# Patient Record
Sex: Female | Born: 1950
Health system: Southern US, Community
[De-identification: ages and names within clinical notes are randomized; demographics above are authoritative.]

## PROBLEM LIST (undated history)

## (undated) DIAGNOSIS — K219 Gastro-esophageal reflux disease without esophagitis: Secondary | ICD-10-CM

## (undated) DIAGNOSIS — M549 Dorsalgia, unspecified: Secondary | ICD-10-CM

## (undated) DIAGNOSIS — J45909 Unspecified asthma, uncomplicated: Secondary | ICD-10-CM

## (undated) DIAGNOSIS — N390 Urinary tract infection, site not specified: Secondary | ICD-10-CM

## (undated) DIAGNOSIS — M1990 Unspecified osteoarthritis, unspecified site: Secondary | ICD-10-CM

## (undated) DIAGNOSIS — Z8489 Family history of other specified conditions: Secondary | ICD-10-CM

## (undated) DIAGNOSIS — M25569 Pain in unspecified knee: Secondary | ICD-10-CM

## (undated) DIAGNOSIS — F419 Anxiety disorder, unspecified: Secondary | ICD-10-CM

## (undated) DIAGNOSIS — F32A Depression, unspecified: Secondary | ICD-10-CM

## (undated) DIAGNOSIS — U071 COVID-19: Secondary | ICD-10-CM

## (undated) DIAGNOSIS — R011 Cardiac murmur, unspecified: Secondary | ICD-10-CM

## (undated) HISTORY — PX: CARDIAC CATHETERIZATION: SHX172

## (undated) HISTORY — DX: Urinary tract infection, site not specified: N39.0

## (undated) HISTORY — DX: COVID-19: U07.1

## (undated) HISTORY — DX: Dorsalgia, unspecified: M54.9

## (undated) HISTORY — PX: TUBAL LIGATION: SHX77

## (undated) HISTORY — PX: COLONOSCOPY: SHX174

## (undated) HISTORY — DX: Unspecified osteoarthritis, unspecified site: M19.90

## (undated) HISTORY — DX: Cardiac murmur, unspecified: R01.1

## (undated) HISTORY — DX: Pain in unspecified knee: M25.569

## (undated) HISTORY — PX: CARPAL TUNNEL RELEASE: SHX101

## (undated) HISTORY — DX: Anxiety disorder, unspecified: F41.9

## (undated) HISTORY — DX: Gastro-esophageal reflux disease without esophagitis: K21.9

## (undated) HISTORY — PX: ROTATOR CUFF REPAIR: SHX139

---

## 1992-01-09 HISTORY — PX: ABDOMINAL HYSTERECTOMY: SHX81

## 1997-06-03 ENCOUNTER — Other Ambulatory Visit: Admission: RE | Admit: 1997-06-03 | Discharge: 1997-06-03 | Payer: Self-pay | Admitting: Gynecology

## 1998-08-25 ENCOUNTER — Other Ambulatory Visit: Admission: RE | Admit: 1998-08-25 | Discharge: 1998-08-25 | Payer: Self-pay | Admitting: Gynecology

## 1999-08-30 ENCOUNTER — Other Ambulatory Visit: Admission: RE | Admit: 1999-08-30 | Discharge: 1999-08-30 | Payer: Self-pay | Admitting: Gynecology

## 2001-11-13 ENCOUNTER — Other Ambulatory Visit: Admission: RE | Admit: 2001-11-13 | Discharge: 2001-11-13 | Payer: Self-pay | Admitting: Gynecology

## 2002-01-08 HISTORY — PX: ROTATOR CUFF REPAIR: SHX139

## 2002-04-24 ENCOUNTER — Observation Stay (HOSPITAL_COMMUNITY): Admission: RE | Admit: 2002-04-24 | Discharge: 2002-04-25 | Payer: Self-pay | Admitting: Orthopedic Surgery

## 2014-08-27 DIAGNOSIS — Z0000 Encounter for general adult medical examination without abnormal findings: Secondary | ICD-10-CM | POA: Diagnosis not present

## 2014-08-27 DIAGNOSIS — Z6831 Body mass index (BMI) 31.0-31.9, adult: Secondary | ICD-10-CM | POA: Diagnosis not present

## 2014-08-27 DIAGNOSIS — L03031 Cellulitis of right toe: Secondary | ICD-10-CM | POA: Diagnosis not present

## 2014-09-09 DIAGNOSIS — H6123 Impacted cerumen, bilateral: Secondary | ICD-10-CM | POA: Diagnosis not present

## 2014-09-09 DIAGNOSIS — H900 Conductive hearing loss, bilateral: Secondary | ICD-10-CM | POA: Diagnosis not present

## 2014-11-04 DIAGNOSIS — Z6830 Body mass index (BMI) 30.0-30.9, adult: Secondary | ICD-10-CM | POA: Diagnosis not present

## 2014-11-04 DIAGNOSIS — J208 Acute bronchitis due to other specified organisms: Secondary | ICD-10-CM | POA: Diagnosis not present

## 2015-03-22 DIAGNOSIS — Z23 Encounter for immunization: Secondary | ICD-10-CM | POA: Diagnosis not present

## 2015-08-26 DIAGNOSIS — Z79899 Other long term (current) drug therapy: Secondary | ICD-10-CM | POA: Diagnosis not present

## 2015-08-26 DIAGNOSIS — R5383 Other fatigue: Secondary | ICD-10-CM | POA: Diagnosis not present

## 2015-08-26 DIAGNOSIS — Z6831 Body mass index (BMI) 31.0-31.9, adult: Secondary | ICD-10-CM | POA: Diagnosis not present

## 2015-08-26 DIAGNOSIS — Z131 Encounter for screening for diabetes mellitus: Secondary | ICD-10-CM | POA: Diagnosis not present

## 2015-08-26 DIAGNOSIS — M170 Bilateral primary osteoarthritis of knee: Secondary | ICD-10-CM | POA: Diagnosis not present

## 2015-10-26 DIAGNOSIS — Z23 Encounter for immunization: Secondary | ICD-10-CM | POA: Diagnosis not present

## 2016-01-31 DIAGNOSIS — Z6831 Body mass index (BMI) 31.0-31.9, adult: Secondary | ICD-10-CM | POA: Diagnosis not present

## 2016-01-31 DIAGNOSIS — J0101 Acute recurrent maxillary sinusitis: Secondary | ICD-10-CM | POA: Diagnosis not present

## 2016-01-31 DIAGNOSIS — M170 Bilateral primary osteoarthritis of knee: Secondary | ICD-10-CM | POA: Diagnosis not present

## 2016-05-03 DIAGNOSIS — M170 Bilateral primary osteoarthritis of knee: Secondary | ICD-10-CM | POA: Diagnosis not present

## 2016-05-03 DIAGNOSIS — Z0000 Encounter for general adult medical examination without abnormal findings: Secondary | ICD-10-CM | POA: Diagnosis not present

## 2016-05-03 DIAGNOSIS — M545 Low back pain: Secondary | ICD-10-CM | POA: Diagnosis not present

## 2016-05-03 DIAGNOSIS — Z6830 Body mass index (BMI) 30.0-30.9, adult: Secondary | ICD-10-CM | POA: Diagnosis not present

## 2016-05-31 DIAGNOSIS — Z1231 Encounter for screening mammogram for malignant neoplasm of breast: Secondary | ICD-10-CM | POA: Diagnosis not present

## 2016-06-20 DIAGNOSIS — N6312 Unspecified lump in the right breast, upper inner quadrant: Secondary | ICD-10-CM | POA: Diagnosis not present

## 2016-06-20 DIAGNOSIS — N6310 Unspecified lump in the right breast, unspecified quadrant: Secondary | ICD-10-CM | POA: Diagnosis not present

## 2016-06-20 DIAGNOSIS — R928 Other abnormal and inconclusive findings on diagnostic imaging of breast: Secondary | ICD-10-CM | POA: Diagnosis not present

## 2016-08-06 DIAGNOSIS — L237 Allergic contact dermatitis due to plants, except food: Secondary | ICD-10-CM | POA: Diagnosis not present

## 2016-08-06 DIAGNOSIS — F411 Generalized anxiety disorder: Secondary | ICD-10-CM | POA: Diagnosis not present

## 2016-10-24 DIAGNOSIS — Z23 Encounter for immunization: Secondary | ICD-10-CM | POA: Diagnosis not present

## 2016-11-06 DIAGNOSIS — M5489 Other dorsalgia: Secondary | ICD-10-CM | POA: Diagnosis not present

## 2016-11-09 ENCOUNTER — Other Ambulatory Visit: Payer: Self-pay | Admitting: Orthopedic Surgery

## 2016-11-09 DIAGNOSIS — M549 Dorsalgia, unspecified: Secondary | ICD-10-CM

## 2016-11-13 DIAGNOSIS — Z823 Family history of stroke: Secondary | ICD-10-CM | POA: Diagnosis not present

## 2016-11-13 DIAGNOSIS — M19012 Primary osteoarthritis, left shoulder: Secondary | ICD-10-CM | POA: Diagnosis not present

## 2016-11-13 DIAGNOSIS — M19011 Primary osteoarthritis, right shoulder: Secondary | ICD-10-CM | POA: Diagnosis not present

## 2016-11-13 DIAGNOSIS — R1084 Generalized abdominal pain: Secondary | ICD-10-CM | POA: Diagnosis not present

## 2016-11-13 DIAGNOSIS — M545 Low back pain: Secondary | ICD-10-CM | POA: Diagnosis not present

## 2016-11-13 DIAGNOSIS — M170 Bilateral primary osteoarthritis of knee: Secondary | ICD-10-CM | POA: Diagnosis not present

## 2016-11-13 DIAGNOSIS — R109 Unspecified abdominal pain: Secondary | ICD-10-CM | POA: Diagnosis not present

## 2016-11-13 DIAGNOSIS — N281 Cyst of kidney, acquired: Secondary | ICD-10-CM | POA: Diagnosis not present

## 2016-11-13 DIAGNOSIS — R110 Nausea: Secondary | ICD-10-CM | POA: Diagnosis not present

## 2016-11-13 DIAGNOSIS — Z801 Family history of malignant neoplasm of trachea, bronchus and lung: Secondary | ICD-10-CM | POA: Diagnosis not present

## 2016-11-13 DIAGNOSIS — R1030 Lower abdominal pain, unspecified: Secondary | ICD-10-CM | POA: Diagnosis not present

## 2016-11-13 DIAGNOSIS — Z79899 Other long term (current) drug therapy: Secondary | ICD-10-CM | POA: Diagnosis not present

## 2016-11-13 DIAGNOSIS — G8929 Other chronic pain: Secondary | ICD-10-CM | POA: Diagnosis not present

## 2016-11-13 DIAGNOSIS — N170 Acute kidney failure with tubular necrosis: Secondary | ICD-10-CM | POA: Diagnosis not present

## 2016-11-13 DIAGNOSIS — Z803 Family history of malignant neoplasm of breast: Secondary | ICD-10-CM | POA: Diagnosis not present

## 2016-11-13 DIAGNOSIS — Z90710 Acquired absence of both cervix and uterus: Secondary | ICD-10-CM | POA: Diagnosis not present

## 2016-11-13 DIAGNOSIS — N390 Urinary tract infection, site not specified: Secondary | ICD-10-CM | POA: Diagnosis not present

## 2016-11-13 DIAGNOSIS — Z8249 Family history of ischemic heart disease and other diseases of the circulatory system: Secondary | ICD-10-CM | POA: Diagnosis not present

## 2016-11-13 DIAGNOSIS — R112 Nausea with vomiting, unspecified: Secondary | ICD-10-CM | POA: Diagnosis not present

## 2016-11-13 DIAGNOSIS — R509 Fever, unspecified: Secondary | ICD-10-CM | POA: Diagnosis not present

## 2016-11-14 ENCOUNTER — Other Ambulatory Visit: Payer: Self-pay

## 2016-11-14 DIAGNOSIS — R1084 Generalized abdominal pain: Secondary | ICD-10-CM | POA: Diagnosis not present

## 2016-11-14 DIAGNOSIS — M19012 Primary osteoarthritis, left shoulder: Secondary | ICD-10-CM | POA: Diagnosis not present

## 2016-11-14 DIAGNOSIS — R110 Nausea: Secondary | ICD-10-CM | POA: Diagnosis not present

## 2016-11-14 DIAGNOSIS — R1030 Lower abdominal pain, unspecified: Secondary | ICD-10-CM | POA: Diagnosis not present

## 2016-11-14 DIAGNOSIS — N390 Urinary tract infection, site not specified: Secondary | ICD-10-CM | POA: Diagnosis not present

## 2016-11-14 DIAGNOSIS — M19011 Primary osteoarthritis, right shoulder: Secondary | ICD-10-CM | POA: Diagnosis not present

## 2016-11-14 DIAGNOSIS — N170 Acute kidney failure with tubular necrosis: Secondary | ICD-10-CM | POA: Diagnosis not present

## 2016-11-14 DIAGNOSIS — M170 Bilateral primary osteoarthritis of knee: Secondary | ICD-10-CM | POA: Diagnosis not present

## 2016-11-14 DIAGNOSIS — R112 Nausea with vomiting, unspecified: Secondary | ICD-10-CM | POA: Diagnosis not present

## 2016-11-27 DIAGNOSIS — F411 Generalized anxiety disorder: Secondary | ICD-10-CM | POA: Diagnosis not present

## 2016-11-27 DIAGNOSIS — M545 Low back pain: Secondary | ICD-10-CM | POA: Diagnosis not present

## 2016-11-27 DIAGNOSIS — N3001 Acute cystitis with hematuria: Secondary | ICD-10-CM | POA: Diagnosis not present

## 2016-12-20 ENCOUNTER — Ambulatory Visit: Payer: Self-pay | Admitting: Gynecology

## 2017-01-29 DIAGNOSIS — M810 Age-related osteoporosis without current pathological fracture: Secondary | ICD-10-CM | POA: Diagnosis not present

## 2017-01-29 DIAGNOSIS — E2839 Other primary ovarian failure: Secondary | ICD-10-CM | POA: Diagnosis not present

## 2017-02-01 ENCOUNTER — Ambulatory Visit (INDEPENDENT_AMBULATORY_CARE_PROVIDER_SITE_OTHER): Payer: Medicare Other | Admitting: Gynecology

## 2017-02-01 ENCOUNTER — Encounter: Payer: Self-pay | Admitting: Gynecology

## 2017-02-01 ENCOUNTER — Telehealth: Payer: Self-pay | Admitting: *Deleted

## 2017-02-01 VITALS — BP 122/76 | Ht 63.5 in | Wt 184.0 lb

## 2017-02-01 DIAGNOSIS — R350 Frequency of micturition: Secondary | ICD-10-CM | POA: Diagnosis not present

## 2017-02-01 DIAGNOSIS — Z01411 Encounter for gynecological examination (general) (routine) with abnormal findings: Secondary | ICD-10-CM

## 2017-02-01 DIAGNOSIS — R1030 Lower abdominal pain, unspecified: Secondary | ICD-10-CM

## 2017-02-01 DIAGNOSIS — N952 Postmenopausal atrophic vaginitis: Secondary | ICD-10-CM

## 2017-02-01 NOTE — Patient Instructions (Signed)
Office will call you to arrange a gastroenterology appointment.  If you do not hear from their office within the next 1-2 weeks call our office.

## 2017-02-01 NOTE — Telephone Encounter (Signed)
Referral placed at [facility] they will call patient to schedule.

## 2017-02-01 NOTE — Progress Notes (Signed)
    Grace Thomas [birth year] [id number]        67 y.o.  G2P2 new patient who has not had a gynecologic exam in a number of years.  For breast and pelvic exam.  History of TAH/BSO by Dr. Ubaldo Glassing for pain in 1994.  Was hospitalized in November due to abdominal pain and was felt to have a gastritis.  Reportedly negative CT scan.  Since then she has had lower abdominal pressure and discomfort.  No diarrhea or constipation.  No urinary symptoms such as frequency dysuria urgency low back pain fever or chills.  Past medical history,surgical history, problem list, medications, allergies, family history and social history were all reviewed and documented as reviewed in the EPIC chart.  ROS:  Performed with pertinent positives and negatives included in the history, assessment and plan.   Additional significant findings : None   Exam: Caryn Bee assistant Vitals:   02/01/17 1155  BP: 122/76  Weight: 184 lb (83.5 kg)  Height: 5' 3.5" (1.613 m)   Body mass index is 32.08 kg/m.  General appearance:  Normal affect, orientation and appearance. Skin: Grossly normal HEENT: Without gross lesions.  No cervical or supraclavicular adenopathy. Thyroid normal.  Lungs:  Clear without wheezing, rales or rhonchi Cardiac: RR, without RMG Abdominal:  Soft, nontender, without masses, guarding, rebound, organomegaly or hernia Breasts:  Examined lying and sitting without masses, retractions, discharge or axillary adenopathy. Pelvic:  Ext, BUS, Vagina: With atrophic changes  Adnexa: Without masses or tenderness    Anus and perineum: Normal   Rectovaginal: Normal sphincter tone without palpated masses or tenderness.    Assessment/Plan:  67 y.o. G2P2 female for breast and pelvic exam.   1. Lower abdominal discomfort.  Exam is negative.  Suspect GI in etiology given history that it started with a gastritis type onset.  Also status post TAH/BSO.  Reportedly negative CT scan.  Will obtain copy from hospital in [place]  where she had a done.  Recommend follow-up with GI now.  Possible low-grade colitis or diverticulitis.  We will help her make this appointment.  Urine analysis is negative today. 2. Mammography 06/2016.  Continue with annual mammography when due.  Breast exam normal today. 3. DEXA reported recently.  She will follow-up with her primary physician who is following her for this.  I have no copies of these reports in epic. 4. Pap smear reported 5 years ago.  No Pap smear done today.  Reviewed current screening guidelines.  She has no history of abnormal Pap smears.  Over the age of 67 and status post hysterectomy for benign indications.  We both agree to stop screening. 5. Colonoscopy many years ago.  Will address when she sees the gastroenterologist and I suspect that they will proceed with this at that time. 6. Health maintenance.  No routine lab work done as patient does this elsewhere.  Follow-up in 1 year for gynecologic exam.  Follow-up with the gastroenterologist in reference to her lower abdominal pain.  Additional time in excess of her breast and pelvic exam was spent in direct face to face counseling and coordination of care in regards to her lower abdominal pain.    Anastasio Auerbach MD, 12:28 PM 02/01/2017

## 2017-02-01 NOTE — Telephone Encounter (Signed)
-----   Message from Anastasio Auerbach, MD sent at 02/01/2017 12:25 PM EST ----- Arrange for GI appointment with [facility] reference lower abdominal patient status post hysterectomy BSO in the past.  Reportedly negative CT scan.  Seems to follow bout of gastroenteritis in November now with chronic pain

## 2017-02-02 LAB — URINALYSIS, COMPLETE W/RFL CULTURE
BACTERIA UA: NONE SEEN /HPF
BILIRUBIN URINE: NEGATIVE
Glucose, UA: NEGATIVE
Hgb urine dipstick: NEGATIVE
Hyaline Cast: NONE SEEN /LPF
Ketones, ur: NEGATIVE
LEUKOCYTE ESTERASE: NEGATIVE
Nitrites, Initial: NEGATIVE
PROTEIN: NEGATIVE
RBC / HPF: NONE SEEN /HPF (ref 0–2)
Specific Gravity, Urine: 1.015 (ref 1.001–1.03)
WBC, UA: NONE SEEN /HPF (ref 0–5)
pH: 5.5 (ref 5.0–8.0)

## 2017-02-02 LAB — NO CULTURE INDICATED

## 2017-02-06 NOTE — Telephone Encounter (Signed)
[facility] has left message for pt to call x 2

## 2017-02-08 ENCOUNTER — Encounter: Payer: Self-pay | Admitting: Gastroenterology

## 2017-02-12 NOTE — Telephone Encounter (Signed)
Pt scheduled on 03/20/17 with Dr.Danis

## 2017-03-18 ENCOUNTER — Telehealth: Payer: Self-pay | Admitting: *Deleted

## 2017-03-18 ENCOUNTER — Other Ambulatory Visit: Payer: Self-pay

## 2017-03-18 NOTE — Telephone Encounter (Signed)
Pt called and left message in voicemail, to call her. I called and left message for pt to call.

## 2017-03-18 NOTE — Telephone Encounter (Signed)
Receiving request for Diflucan from pharmacy.  No communication from the patient as to why?

## 2017-03-18 NOTE — Telephone Encounter (Signed)
Left detailed message in voice mail and asked her to call me back and let me know what sx she is having that makes her think she needs refill on this Rx.

## 2017-03-19 ENCOUNTER — Telehealth: Payer: Self-pay

## 2017-03-19 NOTE — Telephone Encounter (Signed)
Patient has not called back. I denied refill with a note that patient needs to contact provider and put a note on that that I left message for her to call to discuss sx.

## 2017-03-19 NOTE — Telephone Encounter (Signed)
Grace Thomas spoke with patient.

## 2017-03-19 NOTE — Telephone Encounter (Signed)
Left detailed message in her voice mail per [facility] access note on file.

## 2017-03-19 NOTE — Telephone Encounter (Signed)
It does not sound like it worked in a more sure putting more medication towards that is the right solution.  I recommend office visit for evaluation.

## 2017-03-19 NOTE — Progress Notes (Signed)
[facility] Consult Note:  History: Grace Thomas 03/20/2017  Referring physician: Donalynn Furlong, MD  Reason for consult/chief complaint: Abdominal Pain (lower abdominal pain; patient seems to associate this more with uti and gyn issues ); Diarrhea (at times when she is nervous; no blood noted in stool; does not recall ever having a colonoscopy); and Gas (with belching and bloating)   Subjective  HPI:  This is a 67 year old woman referred by Dr. Phineas Real of gynecology for lower abdominal pain.  She is a limited historian, and we have no primary care records.  She reports at least several months of intermittent crampy suprapubic abdominal pain as well as occasional loose nonbloody stool.  It is not clear that those 2 things occur at the same time.  Sometimes the diarrhea seems to happen if she feels anxious.  There is no clear relation to time of day or eating.  She is also bothered by vaginal yeast infection with persistent burning and itching and will soon be following up with Dr. Phineas Real regarding this.  She was apparently hospitalized in [place] earlier this year with abdominal pain and diarrhea, she believes a scan of the abdomen was done, we do not have the records or report.  She recalls being told that they felt she had a viral infection. No prior history with [facility].  She does not think she is ever had a colonoscopy.  ROS:  Review of Systems  Constitutional: Negative for appetite change and unexpected weight change.  HENT: Negative for mouth sores and voice change.   Eyes: Negative for pain and redness.  Respiratory: Negative for cough and shortness of breath.   Cardiovascular: Negative for chest pain and palpitations.  Genitourinary: Negative for dysuria and hematuria.  Musculoskeletal: Positive for arthralgias. Negative for myalgias.  Skin: Negative for pallor and rash.  Neurological: Negative for weakness and headaches.  Hematological: Negative  for adenopathy.     Past Medical History: Past Medical History:  Diagnosis Date  . Anxiety   . Arthritis   . Recurrent UTI      Past Surgical History: Past Surgical History:  Procedure Laterality Date  . ABDOMINAL HYSTERECTOMY  1994   TAH BSO  . CARPAL TUNNEL RELEASE Bilateral   . ROTATOR CUFF REPAIR Right   . TUBAL LIGATION       Family History: Family History  Problem Relation Age of Onset  . Heart failure Mother   . Stroke Father   . Bone cancer Sister   . Pancreatic cancer Sister   . Colitis Daughter     Social History: Social History   Socioeconomic History  . Marital status: Married    Spouse name: None  . Number of children: None  . Years of education: None  . Highest education level: None  Social Needs  . Financial resource strain: None  . Food insecurity - worry: None  . Food insecurity - inability: None  . Transportation needs - medical: None  . Transportation needs - non-medical: None  Occupational History  . None  Tobacco Use  . Smoking status: Never Smoker  . Smokeless tobacco: Never Used  Substance and Sexual Activity  . Alcohol use: No    Frequency: Never  . Drug use: No  . Sexual activity: Not Currently    Comment: 1st intercourse 14 yo-1 partner  Other Topics Concern  . None  Social History Narrative  . None    Allergies: No Known Allergies  Outpatient Meds: Current Outpatient  Medications  Medication Sig Dispense Refill  . ALPRAZolam (XANAX) 0.25 MG tablet Take 0.25 mg by mouth at bedtime as needed for anxiety.    . celecoxib (CELEBREX) 100 MG capsule Take 100 mg by mouth 2 (two) times daily as needed.     . Cholecalciferol (VITAMIN D PO) Take 2,000 Units by mouth daily.     . traMADol (ULTRAM) 50 MG tablet Take by mouth every 6 (six) hours as needed.    Marland Kitchen PEG-KCl-NaCl-NaSulf-Na Asc-C (PLENVU) 140 g SOLR Take 140 g by mouth as directed. 1 each 0   No current facility-administered medications for this visit.        ___________________________________________________________________ Objective   Exam:  BP 112/72   Pulse 72   Ht 5' 3.5" (1.613 m)   Wt 185 lb 9.6 oz (84.2 kg)   BMI 32.36 kg/m    General: this is a(n) well-appearing woman  Eyes: sclera anicteric, no redness  ENT: oral mucosa moist without lesions, no cervical or supraclavicular lymphadenopathy, good dentition  CV: RRR without murmur, S1/S2, no JVD, no peripheral edema  Resp: clear to auscultation bilaterally, normal RR and effort noted  GI: soft, no tenderness, with active bowel sounds. No guarding or palpable organomegaly noted.  Skin; warm and dry, no rash or jaundice noted  Neuro: awake, alert and oriented x 3. Normal gross motor function and fluent speech  Labs:  No data for review  Assessment: Encounter Diagnoses  Name Primary?  . Lower abdominal pain Yes  . Diarrhea, unspecified type     The pain is somewhat difficult to characterize.  It is also unclear if this intermittent diarrhea is related to it.  There does seem to be an ongoing gynecologic issue as well.  Plan:  Colonoscopy.  She is agreeable after discussion of procedure and risks.  The benefits and risks of the planned procedure were described in detail with the patient or (when appropriate) their health care proxy.  Risks were outlined as including, but not limited to, bleeding, infection, perforation, adverse medication reaction leading to cardiac or pulmonary decompensation, or pancreatitis (if ERCP).  The limitation of incomplete mucosal visualization was also discussed.  No guarantees or warranties were given.   Thank you for the courtesy of this consult.  Please call me with any questions or concerns.  Nelida Meuse III  CC: Donalynn Furlong, MD

## 2017-03-19 NOTE — Telephone Encounter (Signed)
Patient had requested Diflucan tablet. I called her to understand why.  She said since December she has been dealing with vaginal yeast infection.  She said her PCP prescribed Diflucan when she left the hospital.   Since then she has used Monistat 3 a time or two. She has "terrible vaginal itching"  And a little discharge and no odor. She said she asked you to refill because she had seen you recently. She said to ask if you would please refill it and if it persists she will come see you.

## 2017-03-20 ENCOUNTER — Encounter: Payer: Self-pay | Admitting: Gastroenterology

## 2017-03-20 ENCOUNTER — Ambulatory Visit (INDEPENDENT_AMBULATORY_CARE_PROVIDER_SITE_OTHER): Payer: Medicare Other | Admitting: Gastroenterology

## 2017-03-20 VITALS — BP 112/72 | HR 72 | Ht 63.5 in | Wt 185.6 lb

## 2017-03-20 DIAGNOSIS — R1030 Lower abdominal pain, unspecified: Secondary | ICD-10-CM

## 2017-03-20 DIAGNOSIS — R197 Diarrhea, unspecified: Secondary | ICD-10-CM

## 2017-03-20 MED ORDER — PEG-KCL-NACL-NASULF-NA ASC-C 140 G PO SOLR: 140.0000 g | ORAL | 0 refills | Status: DC

## 2017-03-20 NOTE — Patient Instructions (Signed)
If you are age 68 or older, your body mass index should be between 23-30. Your Body mass index is 32.36 kg/m. If this is out of the aforementioned range listed, please consider follow up with your Primary Care Provider.  If you are age 56 or younger, your body mass index should be between 19-25. Your Body mass index is 32.36 kg/m. If this is out of the aformentioned range listed, please consider follow up with your Primary Care Provider.   You have been scheduled for a colonoscopy. Please follow written instructions given to you at your visit today.  Please pick up your prep supplies at the pharmacy within the next 1-3 days. If you use inhalers (even only as needed), please bring them with you on the day of your procedure. Your physician has requested that you go to www.startemmi.com and enter the access code given to you at your visit today. This web site gives a general overview about your procedure. However, you should still follow specific instructions given to you by our office regarding your preparation for the procedure.  Thank you for choosing [facility].  Wilfrid Lund, MD

## 2017-03-21 ENCOUNTER — Ambulatory Visit (INDEPENDENT_AMBULATORY_CARE_PROVIDER_SITE_OTHER): Payer: Medicare Other

## 2017-03-21 ENCOUNTER — Encounter: Payer: Self-pay | Admitting: Podiatry

## 2017-03-21 ENCOUNTER — Ambulatory Visit (INDEPENDENT_AMBULATORY_CARE_PROVIDER_SITE_OTHER): Payer: Medicare Other | Admitting: Gynecology

## 2017-03-21 ENCOUNTER — Ambulatory Visit (INDEPENDENT_AMBULATORY_CARE_PROVIDER_SITE_OTHER): Payer: Medicare Other | Admitting: Podiatry

## 2017-03-21 ENCOUNTER — Encounter: Payer: Self-pay | Admitting: Gynecology

## 2017-03-21 VITALS — BP 118/59 | HR 79 | Resp 16 | Ht 63.5 in | Wt 184.0 lb

## 2017-03-21 VITALS — BP 124/80

## 2017-03-21 DIAGNOSIS — M2042 Other hammer toe(s) (acquired), left foot: Secondary | ICD-10-CM

## 2017-03-21 DIAGNOSIS — B351 Tinea unguium: Secondary | ICD-10-CM | POA: Diagnosis not present

## 2017-03-21 DIAGNOSIS — M79674 Pain in right toe(s): Secondary | ICD-10-CM

## 2017-03-21 DIAGNOSIS — R102 Pelvic and perineal pain unspecified side: Secondary | ICD-10-CM

## 2017-03-21 DIAGNOSIS — M79675 Pain in left toe(s): Secondary | ICD-10-CM | POA: Diagnosis not present

## 2017-03-21 DIAGNOSIS — L84 Corns and callosities: Secondary | ICD-10-CM

## 2017-03-21 DIAGNOSIS — N898 Other specified noninflammatory disorders of vagina: Secondary | ICD-10-CM | POA: Diagnosis not present

## 2017-03-21 DIAGNOSIS — M2041 Other hammer toe(s) (acquired), right foot: Secondary | ICD-10-CM

## 2017-03-21 LAB — WET PREP FOR TRICH, YEAST, CLUE

## 2017-03-21 MED ORDER — TERCONAZOLE 0.4 % VA CREA: 1.0000 | TOPICAL_CREAM | Freq: Every day | VAGINAL | 0 refills | Status: DC

## 2017-03-21 NOTE — Progress Notes (Signed)
Subjective:   Patient ID: Grace Thomas, female   DOB: 67 y.o.   MRN: [id number]   HPI Patient presents with nail disease with pain 1-5 both feet and digital deformities bilateral along with lesion second digit left over right foot that is painful second toe.  Patient does not smoke and likes to be active   Review of Systems  All other systems reviewed and are negative.       Objective:  Physical Exam  Constitutional: She appears well-developed and well-nourished.  Cardiovascular: Intact distal pulses.  Pulmonary/Chest: Effort normal.  Musculoskeletal: Normal range of motion.  Neurological: She is alert.  Skin: Skin is warm.  Nursing note and vitals reviewed.   Neurovascular status was found to be intact muscle strength was found to be adequate with range of motion within normal limits.  Patient does have digital deformities with hammertoe deformities lesser digits bilateral with compression of the hallux against the second toe bilateral with keratotic lesion second digit left over right that is painful when pressed.  Patient was noted to have good digital perfusion and is well oriented x3     Assessment:  Mycotic nail infection with pain 1-5 both feet along with hammertoe deformity and keratotic lesion formation     Plan:  H&P conditions reviewed and debridement of nailbeds and taken out the corners carefully 1-5 both feet accomplished with no iatrogenic bleeding and I did debride lesion applied padding and if this were to become worse at one point digital surgery may be necessary  X-ray indicates that there is digital deformities and there is pressure between the hallux and second toe of both feet

## 2017-03-21 NOTE — Progress Notes (Signed)
    Grace Thomas [birth year] [id number]        67 y.o.  G2P2 is complaining of vaginal itching starting approximately 1 week ago.  No discharge or odor.  No UTI symptoms such as frequency dysuria urgency low back pain fever or chills.  She does have suprapubic lower abdominal cramping bloating type discomfort that she is being evaluated by GI.  She has a colonoscopy scheduled next week.  She is status post TAH/BSO in the past.  Used OTC product for the yeast and notes that her symptoms seem to be gone now.  Past medical history,surgical history, problem list, medications, allergies, family history and social history were all reviewed and documented in the EPIC chart.  Directed ROS with pertinent positives and negatives documented in the history of present illness/assessment and plan.  Exam: Caryn Bee assistant Vitals:   03/21/17 1128  BP: 124/80   General appearance:  Normal Abdomen soft nontender without masses guarding rebound Pelvic external BUS vagina with atrophic changes.  Scant white discharge noted.  Bimanual without masses or tenderness.  Assessment/Plan:  67 y.o. G2P2 with history of vaginal itching used OTC product and now notes symptoms seem to be resolved.  She does note more frequent yeast infections over the last year or 2.  Exam without evidence of active infection with wet prep negative.  I did recommend Terazol 7 day cream to have available so that if she has a recurrence of symptoms to go ahead and use this instead of the OTC product to see if this may cover more resistant species of yeast to prevent recurrences.  Otherwise she will continue to follow-up with GI in reference to her abdominal pain.  He did a urine analysis today because of her abdominal discomfort and is negative.    Anastasio Auerbach MD, 11:47 AM 03/21/2017

## 2017-03-21 NOTE — Patient Instructions (Addendum)
Follow-up with gastroenterology in reference to evaluation for your abdominal pain.  Follow-up if vaginal itching returns and becomes an issue.

## 2017-03-21 NOTE — Progress Notes (Signed)
   Subjective:    Patient ID: Grace Thomas, female    DOB: April 30, 1950, 67 y.o.   MRN: [id number]  HPI    Review of Systems  HENT: Positive for sinus pain.   Gastrointestinal: Positive for abdominal pain.  Musculoskeletal: Positive for back pain.  Hematological: Bruises/bleeds easily.  All other systems reviewed and are negative.      Objective:   Physical Exam        Assessment & Plan:

## 2017-03-23 LAB — URINALYSIS, COMPLETE W/RFL CULTURE
BACTERIA UA: NONE SEEN /HPF
BILIRUBIN URINE: NEGATIVE
Glucose, UA: NEGATIVE
HYALINE CAST: NONE SEEN /LPF
Ketones, ur: NEGATIVE
Leukocyte Esterase: NEGATIVE
Nitrites, Initial: NEGATIVE
PROTEIN: NEGATIVE
Specific Gravity, Urine: 1.02 (ref 1.001–1.03)
pH: 5.5 (ref 5.0–8.0)

## 2017-03-23 LAB — URINE CULTURE
MICRO NUMBER: 90330829
SPECIMEN QUALITY: ADEQUATE

## 2017-03-23 LAB — CULTURE INDICATED

## 2017-03-26 ENCOUNTER — Telehealth: Payer: Self-pay | Admitting: Gastroenterology

## 2017-03-27 NOTE — Telephone Encounter (Signed)
Patient calling Grace Thomas back.

## 2017-03-27 NOTE — Telephone Encounter (Signed)
Left a message to return call.  

## 2017-03-27 NOTE — Telephone Encounter (Signed)
Pt. aware and overly thankful for a free sample of Plenvu.

## 2017-04-15 ENCOUNTER — Other Ambulatory Visit: Payer: Self-pay

## 2017-04-15 ENCOUNTER — Ambulatory Visit (AMBULATORY_SURGERY_CENTER): Payer: Medicare Other | Admitting: Gastroenterology

## 2017-04-15 ENCOUNTER — Encounter: Payer: Self-pay | Admitting: Gastroenterology

## 2017-04-15 VITALS — BP 122/65 | HR 77 | Temp 97.8°F | Resp 16 | Ht 63.5 in | Wt 185.0 lb

## 2017-04-15 DIAGNOSIS — D127 Benign neoplasm of rectosigmoid junction: Secondary | ICD-10-CM | POA: Diagnosis not present

## 2017-04-15 DIAGNOSIS — R1030 Lower abdominal pain, unspecified: Secondary | ICD-10-CM

## 2017-04-15 DIAGNOSIS — D128 Benign neoplasm of rectum: Secondary | ICD-10-CM

## 2017-04-15 DIAGNOSIS — K529 Noninfective gastroenteritis and colitis, unspecified: Secondary | ICD-10-CM

## 2017-04-15 DIAGNOSIS — D125 Benign neoplasm of sigmoid colon: Secondary | ICD-10-CM

## 2017-04-15 DIAGNOSIS — R197 Diarrhea, unspecified: Secondary | ICD-10-CM | POA: Diagnosis not present

## 2017-04-15 DIAGNOSIS — R109 Unspecified abdominal pain: Secondary | ICD-10-CM | POA: Diagnosis not present

## 2017-04-15 DIAGNOSIS — K635 Polyp of colon: Secondary | ICD-10-CM

## 2017-04-15 MED ORDER — SODIUM CHLORIDE 0.9 % IV SOLN: 500.0000 mL | Freq: Once | INTRAVENOUS | Status: DC

## 2017-04-15 NOTE — Progress Notes (Signed)
A/ox3 pleased with MAC, report to RN 

## 2017-04-15 NOTE — Progress Notes (Signed)
Called to room to assist during endoscopic procedure.  Patient ID and intended procedure confirmed with present staff. Received instructions for my participation in the procedure from the performing physician.  

## 2017-04-15 NOTE — Op Note (Signed)
[facility] Patient Name: Grace Thomas Procedure Date: 04/15/2017 3:40 PM MRN: [id number] Endoscopist: [place] Loletha Carrow , MD Age: 67 Referring MD:  Date of Birth: 1950-11-28 Gender: Female Account #: [id number] Procedure:                Colonoscopy Indications:              Lower abdominal pain, Clinically significant                            diarrhea of unexplained origin Medicines:                Monitored Anesthesia Care Procedure:                Pre-Anesthesia Assessment:                           - Prior to the procedure, a History and Physical                            was performed, and patient medications and                            allergies were reviewed. The patient's tolerance of                            previous anesthesia was also reviewed. The risks                            and benefits of the procedure and the sedation                            options and risks were discussed with the patient.                            All questions were answered, and informed consent                            was obtained. Prior Anticoagulants: The patient has                            taken no previous anticoagulant or antiplatelet                            agents. ASA Grade Assessment: II - A patient with                            mild systemic disease. After reviewing the risks                            and benefits, the patient was deemed in                            satisfactory condition to undergo the procedure.  After obtaining informed consent, the colonoscope                            was passed under direct vision. Throughout the                            procedure, the patient's blood pressure, pulse, and                            oxygen saturations were monitored continuously. The                            Colonoscope was introduced through the anus and                            advanced to the the cecum, identified  by                            appendiceal orifice and ileocecal valve. The                            colonoscopy was performed without difficulty. The                            patient tolerated the procedure well. The quality                            of the bowel preparation was excellent. The                            ileocecal valve, appendiceal orifice, and rectum                            were photographed. The quality of the bowel                            preparation was evaluated using the BBPS [facility]                            Bowel Preparation Scale) with scores of: Right                            Colon = 3, Transverse Colon = 3 and Left Colon = 2.                            The total BBPS score equals 8. Scope In: 4:00:02 PM Scope Out: 4:25:47 PM Scope Withdrawal Time: 0 hours 23 minutes 16 seconds  Total Procedure Duration: 0 hours 25 minutes 45 seconds  Findings:                 The perianal and digital rectal examinations were                            normal.  Normal mucosa was found in the entire colon.                            Biopsies for histology were taken with a cold                            forceps from the right colon and left colon for                            evaluation of microscopic colitis (Jar 1).                           A 12 mm polyp was found in the distal sigmoid                            colon. The polyp was sessile. The polyp was removed                            with a hot snare. Resection and retrieval were                            complete.                           A 6 mm polyp was found in the rectum. The polyp was                            semi-pedunculated. The polyp was removed with a hot                            snare. Resection and retrieval were complete.                           The sigmoid colon was tortuous.                           Internal hemorrhoids were found. The hemorrhoids                             were Grade I (internal hemorrhoids that do not                            prolapse).                           The exam was otherwise without abnormality on                            direct and retroflexion views. Complications:            No immediate complications. Estimated Blood Loss:     Estimated blood loss was minimal. Impression:               - Normal mucosa in the entire examined colon.  Biopsied.                           - One 12 mm polyp in the distal sigmoid colon,                            removed with a hot snare. Resected and retrieved.                           - One 6 mm polyp in the rectum, removed with a hot                            snare. Resected and retrieved.                           - Tortuous colon.                           - Internal hemorrhoids.                           - The examination was otherwise normal on direct                            and retroflexion views.                           If biopsies norgative for microscopic colitis, the                            diarrhea sounds like IBS. It is not clear if the                            lower abdominal pain is related to the change in                            bowel habits or to ongoing urinary issues. Recommendation:           - Patient has a contact number available for                            emergencies. The signs and symptoms of potential                            delayed complications were discussed with the                            patient. Return to normal activities tomorrow.                            Written discharge instructions were provided to the                            patient.                           -  Resume previous diet.                           - Continue present medications.                           - Await pathology results.                           - Repeat colonoscopy is recommended for                             surveillance. The colonoscopy date will be                            determined after pathology results from today's                            exam become available for review. Caidyn Henricksen L. Loletha Carrow, MD 04/15/2017 4:34:25 PM This report has been signed electronically.

## 2017-04-15 NOTE — Progress Notes (Signed)
Pt's states no medical or surgical changes since previsit or office visit. 

## 2017-04-15 NOTE — Patient Instructions (Signed)
*  Handouts given on polyps and hemorrhoids   YOU HAD AN ENDOSCOPIC PROCEDURE TODAY AT THE [facility]:   Refer to the procedure report that was given to you for any specific questions about what was found during the examination.  If the procedure report does not answer your questions, please call your gastroenterologist to clarify.  If you requested that your care partner not be given the details of your procedure findings, then the procedure report has been included in a sealed envelope for you to review at your convenience later.  YOU SHOULD EXPECT: Some feelings of bloating in the abdomen. Passage of more gas than usual.  Walking can help get rid of the air that was put into your GI tract during the procedure and reduce the bloating. If you had a lower endoscopy (such as a colonoscopy or flexible sigmoidoscopy) you may notice spotting of blood in your stool or on the toilet paper. If you underwent a bowel prep for your procedure, you may not have a normal bowel movement for a few days.  Please Note:  You might notice some irritation and congestion in your nose or some drainage.  This is from the oxygen used during your procedure.  There is no need for concern and it should clear up in a day or so.  SYMPTOMS TO REPORT IMMEDIATELY:   Following lower endoscopy (colonoscopy or flexible sigmoidoscopy):  Excessive amounts of blood in the stool  Significant tenderness or worsening of abdominal pains  Swelling of the abdomen that is new, acute  Fever of 100F or higher    For urgent or emergent issues, a gastroenterologist can be reached at any hour by calling [phone].   DIET:  We do recommend a small meal at first, but then you may proceed to your regular diet.  Drink plenty of fluids but you should avoid alcoholic beverages for 24 hours.  ACTIVITY:  You should plan to take it easy for the rest of today and you should NOT DRIVE or use heavy machinery until tomorrow (because of  the sedation medicines used during the test).    FOLLOW UP: Our staff will call the number listed on your records the next business day following your procedure to check on you and address any questions or concerns that you may have regarding the information given to you following your procedure. If we do not reach you, we will leave a message.  However, if you are feeling well and you are not experiencing any problems, there is no need to return our call.  We will assume that you have returned to your regular daily activities without incident.  If any biopsies were taken you will be contacted by phone or by letter within the next 1-3 weeks.  Please call us at [phone] if you have not heard about the biopsies in 3 weeks.    SIGNATURES/CONFIDENTIALITY: You and/or your care partner have signed paperwork which will be entered into your electronic medical record.  These signatures attest to the fact that that the information above on your After Visit Summary has been reviewed and is understood.  Full responsibility of the confidentiality of this discharge information lies with you and/or your care-partner. 

## 2017-04-16 ENCOUNTER — Telehealth: Payer: Self-pay | Admitting: *Deleted

## 2017-04-16 NOTE — Telephone Encounter (Signed)
No answer. Left message to call if questions or concerns. 

## 2017-04-16 NOTE — Telephone Encounter (Signed)
No answer second call.  Left message to call if questions or concerns. 

## 2017-04-23 ENCOUNTER — Encounter: Payer: Self-pay | Admitting: Gastroenterology

## 2017-04-24 ENCOUNTER — Telehealth: Payer: Self-pay | Admitting: Gastroenterology

## 2017-05-08 DIAGNOSIS — F411 Generalized anxiety disorder: Secondary | ICD-10-CM | POA: Diagnosis not present

## 2017-05-08 DIAGNOSIS — Z1389 Encounter for screening for other disorder: Secondary | ICD-10-CM | POA: Diagnosis not present

## 2017-05-08 DIAGNOSIS — Z0000 Encounter for general adult medical examination without abnormal findings: Secondary | ICD-10-CM | POA: Diagnosis not present

## 2017-05-08 DIAGNOSIS — M175 Other unilateral secondary osteoarthritis of knee: Secondary | ICD-10-CM | POA: Diagnosis not present

## 2017-05-08 DIAGNOSIS — M545 Low back pain: Secondary | ICD-10-CM | POA: Diagnosis not present

## 2017-06-12 DIAGNOSIS — J0111 Acute recurrent frontal sinusitis: Secondary | ICD-10-CM | POA: Diagnosis not present

## 2017-06-13 ENCOUNTER — Ambulatory Visit: Payer: Medicare Other | Admitting: Podiatry

## 2017-10-09 DIAGNOSIS — N760 Acute vaginitis: Secondary | ICD-10-CM | POA: Diagnosis not present

## 2017-10-23 DIAGNOSIS — Z23 Encounter for immunization: Secondary | ICD-10-CM | POA: Diagnosis not present

## 2017-10-24 ENCOUNTER — Ambulatory Visit (INDEPENDENT_AMBULATORY_CARE_PROVIDER_SITE_OTHER): Payer: Medicare Other | Admitting: Gynecology

## 2017-10-24 ENCOUNTER — Encounter: Payer: Self-pay | Admitting: Gynecology

## 2017-10-24 VITALS — BP 118/76

## 2017-10-24 DIAGNOSIS — N898 Other specified noninflammatory disorders of vagina: Secondary | ICD-10-CM

## 2017-10-24 DIAGNOSIS — R300 Dysuria: Secondary | ICD-10-CM | POA: Diagnosis not present

## 2017-10-24 LAB — WET PREP FOR TRICH, YEAST, CLUE

## 2017-10-24 MED ORDER — TERCONAZOLE 0.4 % VA CREA: 1.0000 | TOPICAL_CREAM | Freq: Every day | VAGINAL | 0 refills | Status: DC

## 2017-10-24 NOTE — Patient Instructions (Signed)
Use the prescribed vaginal cream nightly for 7 nights

## 2017-10-24 NOTE — Progress Notes (Signed)
    Grace Thomas [birth year] [id number]        67 y.o.  G2P2 presents with several weeks of vaginal irritation with discharge as well as itching.  No odor.  Also some frequency, dysuria and suprapubic discomfort.  No low back pain fever or chills.  Past medical history,surgical history, problem list, medications, allergies, family history and social history were all reviewed and documented in the EPIC chart.  Directed ROS with pertinent positives and negatives documented in the history of present illness/assessment and plan.  Exam: Caryn Bee assistant Vitals:   10/24/17 1505  BP: 118/76   General appearance:  Normal Spine straight without CVA tenderness Abdomen soft nontender without mass guarding rebound Pelvic external BUS vagina with atrophic changes.  Slight white discharge noted.  Bimanual without masses or tenderness.  Assessment/Plan:  67 y.o. G2P2 urine analysis is negative.  We will go ahead and check culture for completeness.  Wet prep also is negative but I think clinically she has a low-grade yeast and will cover her with Terazol 7 day cream.  She will follow-up if her symptoms persist, worsen or recur.    Anastasio Auerbach MD, 3:27 PM 10/24/2017

## 2017-10-26 LAB — URINE CULTURE
MICRO NUMBER: 91249574
Result:: NO GROWTH
SPECIMEN QUALITY: ADEQUATE

## 2017-10-28 ENCOUNTER — Ambulatory Visit (INDEPENDENT_AMBULATORY_CARE_PROVIDER_SITE_OTHER): Payer: Medicare Other

## 2017-10-28 ENCOUNTER — Other Ambulatory Visit: Payer: Self-pay | Admitting: Podiatry

## 2017-10-28 ENCOUNTER — Encounter: Payer: Self-pay | Admitting: Podiatry

## 2017-10-28 ENCOUNTER — Ambulatory Visit (INDEPENDENT_AMBULATORY_CARE_PROVIDER_SITE_OTHER): Payer: Medicare Other | Admitting: Podiatry

## 2017-10-28 DIAGNOSIS — M779 Enthesopathy, unspecified: Secondary | ICD-10-CM | POA: Diagnosis not present

## 2017-10-28 DIAGNOSIS — M2042 Other hammer toe(s) (acquired), left foot: Secondary | ICD-10-CM | POA: Diagnosis not present

## 2017-10-28 DIAGNOSIS — M722 Plantar fascial fibromatosis: Secondary | ICD-10-CM | POA: Diagnosis not present

## 2017-10-28 DIAGNOSIS — M2041 Other hammer toe(s) (acquired), right foot: Secondary | ICD-10-CM | POA: Diagnosis not present

## 2017-10-28 DIAGNOSIS — M778 Other enthesopathies, not elsewhere classified: Secondary | ICD-10-CM

## 2017-10-31 NOTE — Progress Notes (Signed)
Subjective:   Patient ID: Grace Thomas, female   DOB: 67 y.o.   MRN: [id number]   HPI Patient presents stating she woke up 5 days ago on her left foot was really hurting and is feeling some better but she still concerned she may have broken something that she dropped something on it but the beginning of this month   ROS      Objective:  Physical Exam  Neurovascular status intact with inflammation pain dorsal left foot with fluid buildup around the midtarsal joint and no indications of spreading from this area     Assessment:  Probability for injury to the left foot with possible fracture     Plan:  H&P x-ray reviewed and advised on compression therapy mild ice therapy and utilization of supportive shoes.  If symptoms persist may require other treatments  X-ray indicates that there is no signs of fracture with good alignment or other conditions with no issues noted

## 2017-11-15 LAB — URINALYSIS, COMPLETE W/RFL CULTURE
Bacteria, UA: NONE SEEN /HPF
Bilirubin Urine: NEGATIVE
Glucose, UA: NEGATIVE
Hyaline Cast: NONE SEEN /LPF
Ketones, ur: NEGATIVE
Leukocyte Esterase: NEGATIVE
Nitrites, Initial: NEGATIVE
PROTEIN: NEGATIVE
SPECIFIC GRAVITY, URINE: 1.015 (ref 1.001–1.03)
WBC, UA: NONE SEEN /HPF (ref 0–5)
pH: 5.5 (ref 5.0–8.0)

## 2017-11-15 LAB — URINE CULTURE

## 2017-11-15 LAB — CULTURE INDICATED

## 2017-11-19 ENCOUNTER — Telehealth: Payer: Self-pay | Admitting: *Deleted

## 2017-11-19 DIAGNOSIS — N898 Other specified noninflammatory disorders of vagina: Secondary | ICD-10-CM

## 2017-11-19 NOTE — Telephone Encounter (Signed)
Patient called and left message c/o still having problems, I called patient back asking her to call me to talk.

## 2017-11-20 MED ORDER — TERCONAZOLE 0.4 % VA CREA: 1.0000 | TOPICAL_CREAM | Freq: Every day | VAGINAL | 0 refills | Status: DC

## 2017-11-20 NOTE — Telephone Encounter (Signed)
Okay for Terazol 7 day cream.  1 applicator at bedtime x7 nights

## 2017-11-20 NOTE — Telephone Encounter (Signed)
Patient aware, Rx sent.  

## 2017-11-20 NOTE — Telephone Encounter (Signed)
Patient called back c/o vaginal irritation with discharge as well as itching, no odor. She was asked a the Terazol cream or pill could be prescribed? Please advise

## 2017-12-09 DIAGNOSIS — Z131 Encounter for screening for diabetes mellitus: Secondary | ICD-10-CM | POA: Diagnosis not present

## 2018-01-09 ENCOUNTER — Telehealth: Payer: Self-pay | Admitting: *Deleted

## 2018-01-09 NOTE — Telephone Encounter (Signed)
Patient called c/o vaginal itching and pelvic discomfort, able to urinary fine. Asked me to get a copy of Ct scan abdomen/pelvis that Dr. Quentin Mulling ordered in Nov.2018. copy received. Transferred to appointment desk to schedule.

## 2018-01-10 ENCOUNTER — Encounter: Payer: Self-pay | Admitting: Obstetrics & Gynecology

## 2018-01-10 ENCOUNTER — Ambulatory Visit: Payer: Medicare Other | Admitting: Gynecology

## 2018-01-10 ENCOUNTER — Ambulatory Visit (INDEPENDENT_AMBULATORY_CARE_PROVIDER_SITE_OTHER): Payer: Medicare Other | Admitting: Obstetrics & Gynecology

## 2018-01-10 VITALS — BP 126/78

## 2018-01-10 DIAGNOSIS — R102 Pelvic and perineal pain unspecified side: Secondary | ICD-10-CM

## 2018-01-10 DIAGNOSIS — N9489 Other specified conditions associated with female genital organs and menstrual cycle: Secondary | ICD-10-CM

## 2018-01-10 DIAGNOSIS — N949 Unspecified condition associated with female genital organs and menstrual cycle: Secondary | ICD-10-CM | POA: Diagnosis not present

## 2018-01-10 LAB — WET PREP FOR TRICH, YEAST, CLUE

## 2018-01-10 NOTE — Progress Notes (Signed)
    Grace Thomas 1950-01-19 [id number]        68 y.o.  G2P2L2  RP: Vaginal burning and pelvic pain  HPI: Menopause, well on no HRT.  S/P TAH/BSO.  C/O generalized abnormal discomfort with gas.  No pain with urination and no frequency.  Some vaginal/vulvar burning, no abnormal discharge.  No fever.   OB History  Gravida Para Term Preterm AB Living  2 2       2   SAB TAB Ectopic Multiple Live Births               # Outcome Date GA Lbr Len/2nd Weight Sex Delivery Anes PTL Lv  2 Para           1 Para             Past medical history,surgical history, problem list, medications, allergies, family history and social history were all reviewed and documented in the EPIC chart.   Directed ROS with pertinent positives and negatives documented in the history of present illness/assessment and plan.  Exam:  Vitals:   01/10/18 1411  BP: 126/78   General appearance:  Normal  Abdomen:  Soft.  Not distended.  Mildly tender to palpation throughout.  BS present.  Gynecologic exam: Vulva normal.  Speculum:  Vagina normal.  Normal secretions.  Wet prep done.  Bimanual exam:  S/P Total hyst/BSO.  No mass felt, NT.  U/A: Yellow, clear, protein negative, nitrites negative, WBC 6-10, RBC negative, few bacteria.  Pending urine culture  Wet prep: Negative   Assessment/Plan:  68 y.o. G2P2   1. Vaginal burning Wet prep negative and normal vagina and vulva.  Patient reassured.  Probiotic as needed. - WET PREP FOR TRICH, YEAST, CLUE  2. Pelvic pain in female Minimal disturbance of U/A, will wait on urine culture to decide on treatment.  S/P TAH/BSO.  Abdominopelvic discomfort is probably associated with gas in bowels.  Will modify nutrition and f/u with Fam MD. Grace Thomas w/RFL Culture  Other orders - REFLEXIVE URINE CULTURE - Urine Culture  Counseling on above issues and coordination of care >50% x 15 minutes.  [name] MD, 2:15 PM 01/10/2018

## 2018-01-12 LAB — URINALYSIS, COMPLETE W/RFL CULTURE
Bilirubin Urine: NEGATIVE
Glucose, UA: NEGATIVE
HGB URINE DIPSTICK: NEGATIVE
HYALINE CAST: NONE SEEN /LPF
KETONES UR: NEGATIVE
NITRITES URINE, INITIAL: NEGATIVE
PH: 5.5 (ref 5.0–8.0)
Protein, ur: NEGATIVE
RBC / HPF: NONE SEEN /HPF (ref 0–2)
Specific Gravity, Urine: 1.01 (ref 1.001–1.03)

## 2018-01-12 LAB — URINE CULTURE
MICRO NUMBER:: 13492
RESULT: NO GROWTH
SPECIMEN QUALITY:: ADEQUATE

## 2018-01-12 LAB — CULTURE INDICATED

## 2018-01-13 ENCOUNTER — Telehealth: Payer: Self-pay | Admitting: *Deleted

## 2018-01-13 NOTE — Telephone Encounter (Signed)
Patient called and said she does not want referral.

## 2018-01-13 NOTE — Telephone Encounter (Signed)
-----   Message from [name], MD sent at 01/10/2018  2:35 PM EST ----- Regarding: Refer to [facility].  Needs to make nutritional changes to improve health, decrease GE discomfort and loose weight.

## 2018-01-17 ENCOUNTER — Encounter: Payer: Self-pay | Admitting: Obstetrics & Gynecology

## 2018-01-17 NOTE — Patient Instructions (Signed)
  1. Vaginal burning Wet prep negative and normal vagina and vulva.  Patient reassured.  Probiotic as needed. - WET PREP FOR TRICH, YEAST, CLUE  2. Pelvic pain in female Minimal disturbance of U/A, will wait on urine culture to decide on treatment.  S/P TAH/BSO.  Abdominopelvic discomfort is probably associated with gas in bowels.  Will modify nutrition and f/u with Fam MD. [name] w/RFL Culture  Other orders - REFLEXIVE URINE CULTURE - Urine Culture  Grace Thomas, it was a pleasure seeing you today!  I will inform you of your results as soon as they are available.

## 2018-02-03 ENCOUNTER — Encounter: Payer: Medicare Other | Admitting: Gynecology

## 2018-03-05 DIAGNOSIS — J209 Acute bronchitis, unspecified: Secondary | ICD-10-CM | POA: Diagnosis not present

## 2018-06-09 DIAGNOSIS — M25562 Pain in left knee: Secondary | ICD-10-CM | POA: Diagnosis not present

## 2018-06-09 DIAGNOSIS — M545 Low back pain, unspecified: Secondary | ICD-10-CM | POA: Insufficient documentation

## 2018-08-07 ENCOUNTER — Other Ambulatory Visit: Payer: Self-pay

## 2018-09-29 DIAGNOSIS — Z0000 Encounter for general adult medical examination without abnormal findings: Secondary | ICD-10-CM | POA: Diagnosis not present

## 2018-09-29 DIAGNOSIS — Z79899 Other long term (current) drug therapy: Secondary | ICD-10-CM | POA: Diagnosis not present

## 2018-09-29 DIAGNOSIS — Z1389 Encounter for screening for other disorder: Secondary | ICD-10-CM | POA: Diagnosis not present

## 2018-09-29 DIAGNOSIS — E782 Mixed hyperlipidemia: Secondary | ICD-10-CM | POA: Diagnosis not present

## 2018-09-29 DIAGNOSIS — M5441 Lumbago with sciatica, right side: Secondary | ICD-10-CM | POA: Diagnosis not present

## 2018-09-29 DIAGNOSIS — F064 Anxiety disorder due to known physiological condition: Secondary | ICD-10-CM | POA: Diagnosis not present

## 2018-09-29 DIAGNOSIS — E559 Vitamin D deficiency, unspecified: Secondary | ICD-10-CM | POA: Diagnosis not present

## 2018-09-29 DIAGNOSIS — M25562 Pain in left knee: Secondary | ICD-10-CM | POA: Diagnosis not present

## 2018-09-29 DIAGNOSIS — Z131 Encounter for screening for diabetes mellitus: Secondary | ICD-10-CM | POA: Diagnosis not present

## 2018-10-15 ENCOUNTER — Encounter: Payer: Self-pay | Admitting: Gynecology

## 2018-11-10 ENCOUNTER — Ambulatory Visit (INDEPENDENT_AMBULATORY_CARE_PROVIDER_SITE_OTHER): Payer: Medicare Other | Admitting: Otolaryngology

## 2018-11-10 DIAGNOSIS — H903 Sensorineural hearing loss, bilateral: Secondary | ICD-10-CM

## 2018-11-10 DIAGNOSIS — Z23 Encounter for immunization: Secondary | ICD-10-CM | POA: Diagnosis not present

## 2018-11-10 DIAGNOSIS — H6123 Impacted cerumen, bilateral: Secondary | ICD-10-CM | POA: Diagnosis not present

## 2019-03-26 DIAGNOSIS — L918 Other hypertrophic disorders of the skin: Secondary | ICD-10-CM | POA: Diagnosis not present

## 2019-03-26 DIAGNOSIS — Z0189 Encounter for other specified special examinations: Secondary | ICD-10-CM | POA: Diagnosis not present

## 2019-04-29 DIAGNOSIS — F064 Anxiety disorder due to known physiological condition: Secondary | ICD-10-CM | POA: Diagnosis not present

## 2019-04-29 DIAGNOSIS — M25561 Pain in right knee: Secondary | ICD-10-CM | POA: Diagnosis not present

## 2019-04-29 DIAGNOSIS — M4316 Spondylolisthesis, lumbar region: Secondary | ICD-10-CM | POA: Diagnosis not present

## 2019-04-29 DIAGNOSIS — M5136 Other intervertebral disc degeneration, lumbar region: Secondary | ICD-10-CM | POA: Diagnosis not present

## 2019-04-29 DIAGNOSIS — M5441 Lumbago with sciatica, right side: Secondary | ICD-10-CM | POA: Diagnosis not present

## 2019-04-29 DIAGNOSIS — M545 Low back pain: Secondary | ICD-10-CM | POA: Diagnosis not present

## 2019-04-29 DIAGNOSIS — M47814 Spondylosis without myelopathy or radiculopathy, thoracic region: Secondary | ICD-10-CM | POA: Diagnosis not present

## 2019-04-29 DIAGNOSIS — M546 Pain in thoracic spine: Secondary | ICD-10-CM | POA: Diagnosis not present

## 2019-05-22 DIAGNOSIS — H6123 Impacted cerumen, bilateral: Secondary | ICD-10-CM | POA: Diagnosis not present

## 2019-06-03 DIAGNOSIS — M4316 Spondylolisthesis, lumbar region: Secondary | ICD-10-CM | POA: Diagnosis not present

## 2019-06-03 DIAGNOSIS — M5416 Radiculopathy, lumbar region: Secondary | ICD-10-CM | POA: Diagnosis not present

## 2019-06-03 DIAGNOSIS — R030 Elevated blood-pressure reading, without diagnosis of hypertension: Secondary | ICD-10-CM | POA: Diagnosis not present

## 2019-06-03 DIAGNOSIS — M545 Low back pain: Secondary | ICD-10-CM | POA: Diagnosis not present

## 2019-06-04 ENCOUNTER — Other Ambulatory Visit: Payer: Self-pay | Admitting: Neurosurgery

## 2019-06-04 DIAGNOSIS — M5416 Radiculopathy, lumbar region: Secondary | ICD-10-CM

## 2019-06-10 ENCOUNTER — Telehealth: Payer: Self-pay | Admitting: *Deleted

## 2019-06-10 NOTE — Telephone Encounter (Signed)
Patient called left message in triage voicemail requesting Dexa order. States her physician recommended she have one before her MRI scheduled on 6/24/2. Per Dr.Fontaine note at last annual exam on 03/21/17   "DEXA reported recently.  She will follow-up with her primary physician who is following her for this.  I have no copies of these reports in epic."  I left message for patient to call and discuss.

## 2019-06-11 NOTE — Telephone Encounter (Signed)
Patient called back I explained no dexa done here and best to have done at same bone machine. Patient said her PCP has been helping with dexa's and they told her next dexa due in Oct. I explained that it maybe due to the date of last Dexa. Patient said she will call Dr.Stern office and let him know and follow up with PCP as needed.

## 2019-07-02 ENCOUNTER — Other Ambulatory Visit: Payer: Self-pay

## 2019-07-02 ENCOUNTER — Ambulatory Visit
Admission: RE | Admit: 2019-07-02 | Discharge: 2019-07-02 | Disposition: A | Discharge status: Home / Self Care | Payer: Medicare Other | Source: Ambulatory Visit | Attending: Neurosurgery | Admitting: Neurosurgery

## 2019-07-02 DIAGNOSIS — M5416 Radiculopathy, lumbar region: Secondary | ICD-10-CM

## 2019-08-06 ENCOUNTER — Encounter: Payer: Medicare Other | Admitting: Obstetrics and Gynecology

## 2019-08-25 DIAGNOSIS — M47816 Spondylosis without myelopathy or radiculopathy, lumbar region: Secondary | ICD-10-CM | POA: Diagnosis not present

## 2019-09-01 DIAGNOSIS — M47816 Spondylosis without myelopathy or radiculopathy, lumbar region: Secondary | ICD-10-CM | POA: Diagnosis not present

## 2019-09-03 DIAGNOSIS — M47816 Spondylosis without myelopathy or radiculopathy, lumbar region: Secondary | ICD-10-CM | POA: Diagnosis not present

## 2019-09-07 DIAGNOSIS — M47816 Spondylosis without myelopathy or radiculopathy, lumbar region: Secondary | ICD-10-CM | POA: Diagnosis not present

## 2019-09-09 DIAGNOSIS — M47816 Spondylosis without myelopathy or radiculopathy, lumbar region: Secondary | ICD-10-CM | POA: Diagnosis not present

## 2019-09-15 DIAGNOSIS — M47816 Spondylosis without myelopathy or radiculopathy, lumbar region: Secondary | ICD-10-CM | POA: Diagnosis not present

## 2019-09-17 DIAGNOSIS — M47816 Spondylosis without myelopathy or radiculopathy, lumbar region: Secondary | ICD-10-CM | POA: Diagnosis not present

## 2019-09-21 DIAGNOSIS — M47816 Spondylosis without myelopathy or radiculopathy, lumbar region: Secondary | ICD-10-CM | POA: Diagnosis not present

## 2019-09-23 DIAGNOSIS — M47816 Spondylosis without myelopathy or radiculopathy, lumbar region: Secondary | ICD-10-CM | POA: Diagnosis not present

## 2019-09-24 DIAGNOSIS — M47816 Spondylosis without myelopathy or radiculopathy, lumbar region: Secondary | ICD-10-CM | POA: Diagnosis not present

## 2019-09-28 DIAGNOSIS — M47816 Spondylosis without myelopathy or radiculopathy, lumbar region: Secondary | ICD-10-CM | POA: Diagnosis not present

## 2019-09-30 DIAGNOSIS — M47816 Spondylosis without myelopathy or radiculopathy, lumbar region: Secondary | ICD-10-CM | POA: Diagnosis not present

## 2019-10-05 DIAGNOSIS — M47816 Spondylosis without myelopathy or radiculopathy, lumbar region: Secondary | ICD-10-CM | POA: Diagnosis not present

## 2019-10-07 DIAGNOSIS — M47816 Spondylosis without myelopathy or radiculopathy, lumbar region: Secondary | ICD-10-CM | POA: Diagnosis not present

## 2019-10-14 DIAGNOSIS — M47816 Spondylosis without myelopathy or radiculopathy, lumbar region: Secondary | ICD-10-CM | POA: Diagnosis not present

## 2019-10-19 DIAGNOSIS — E2839 Other primary ovarian failure: Secondary | ICD-10-CM | POA: Diagnosis not present

## 2019-10-19 DIAGNOSIS — Z0389 Encounter for observation for other suspected diseases and conditions ruled out: Secondary | ICD-10-CM | POA: Diagnosis not present

## 2019-10-19 DIAGNOSIS — M810 Age-related osteoporosis without current pathological fracture: Secondary | ICD-10-CM | POA: Diagnosis not present

## 2019-11-05 DIAGNOSIS — M25561 Pain in right knee: Secondary | ICD-10-CM | POA: Diagnosis not present

## 2019-11-05 DIAGNOSIS — K219 Gastro-esophageal reflux disease without esophagitis: Secondary | ICD-10-CM | POA: Diagnosis not present

## 2019-11-05 DIAGNOSIS — Z0000 Encounter for general adult medical examination without abnormal findings: Secondary | ICD-10-CM | POA: Diagnosis not present

## 2019-11-05 DIAGNOSIS — F064 Anxiety disorder due to known physiological condition: Secondary | ICD-10-CM | POA: Diagnosis not present

## 2019-11-05 DIAGNOSIS — M5441 Lumbago with sciatica, right side: Secondary | ICD-10-CM | POA: Diagnosis not present

## 2019-11-05 DIAGNOSIS — Z1331 Encounter for screening for depression: Secondary | ICD-10-CM | POA: Diagnosis not present

## 2019-11-17 DIAGNOSIS — B379 Candidiasis, unspecified: Secondary | ICD-10-CM | POA: Diagnosis not present

## 2019-11-17 DIAGNOSIS — R102 Pelvic and perineal pain: Secondary | ICD-10-CM | POA: Diagnosis not present

## 2019-11-17 DIAGNOSIS — Z90722 Acquired absence of ovaries, bilateral: Secondary | ICD-10-CM | POA: Diagnosis not present

## 2019-11-17 DIAGNOSIS — Z90710 Acquired absence of both cervix and uterus: Secondary | ICD-10-CM | POA: Diagnosis not present

## 2019-11-26 DIAGNOSIS — M47816 Spondylosis without myelopathy or radiculopathy, lumbar region: Secondary | ICD-10-CM | POA: Diagnosis not present

## 2019-12-22 DIAGNOSIS — M5416 Radiculopathy, lumbar region: Secondary | ICD-10-CM | POA: Diagnosis not present

## 2019-12-22 DIAGNOSIS — M47816 Spondylosis without myelopathy or radiculopathy, lumbar region: Secondary | ICD-10-CM | POA: Diagnosis not present

## 2019-12-22 DIAGNOSIS — M4316 Spondylolisthesis, lumbar region: Secondary | ICD-10-CM | POA: Diagnosis not present

## 2020-02-04 DIAGNOSIS — M79676 Pain in unspecified toe(s): Secondary | ICD-10-CM | POA: Diagnosis not present

## 2020-02-04 DIAGNOSIS — L03031 Cellulitis of right toe: Secondary | ICD-10-CM | POA: Diagnosis not present

## 2020-02-18 DIAGNOSIS — L03031 Cellulitis of right toe: Secondary | ICD-10-CM | POA: Diagnosis not present

## 2020-03-16 DIAGNOSIS — R102 Pelvic and perineal pain: Secondary | ICD-10-CM | POA: Diagnosis not present

## 2020-03-16 DIAGNOSIS — N6312 Unspecified lump in the right breast, upper inner quadrant: Secondary | ICD-10-CM | POA: Diagnosis not present

## 2020-03-16 DIAGNOSIS — B379 Candidiasis, unspecified: Secondary | ICD-10-CM | POA: Diagnosis not present

## 2020-03-16 DIAGNOSIS — R928 Other abnormal and inconclusive findings on diagnostic imaging of breast: Secondary | ICD-10-CM | POA: Diagnosis not present

## 2020-05-03 DIAGNOSIS — M5412 Radiculopathy, cervical region: Secondary | ICD-10-CM | POA: Diagnosis not present

## 2020-05-03 DIAGNOSIS — S46011D Strain of muscle(s) and tendon(s) of the rotator cuff of right shoulder, subsequent encounter: Secondary | ICD-10-CM | POA: Diagnosis not present

## 2020-05-24 NOTE — Progress Notes (Signed)
70 y.o. G2P2 Married Caucasian female here for annual exam.    Patient has vaginal irritation/yeast occasionally. Denies vaginal discharge.  Does feel a little uncomfortable. Uses Monistat 3 or 7 to treat, the last time being this week.  Has changed her diet to help control this.  Normal pelvic US at [facility] in Nov. 2021 due to pelvic discomfort.   Has some urgency and leakage.  No leak with cough, laugh, or sneeze.  Wears a pad.   States FH of CHF.   2 daughters and one grandson.   PCP: Stoney Bang, MD  No LMP recorded. Patient has had a hysterectomy.           Sexually active: No.  The current method of family planning is status post hysterectomy.    Exercising: No.  housework and running errands--some walking Smoker:  no  Health Maintenance: Pap: 2014 normal per patient History of abnormal Pap:  no MMG:  03-16-20 Diag.Bil/Neg/BiRads2--[facility] Colonoscopy:  04-15-17 polyps;next 3 years BMD: 10-19-19 Result :Normal--in Care Everywhere TDaP: PCP--Unsure Gardasil:   no HIV:no Hep C:no Screening Labs:  PCP   reports that she has never smoked. She has never used smokeless tobacco. She reports that she does not drink alcohol and does not use drugs.  Past Medical History:  Diagnosis Date  . Anxiety   . Arthritis   . Recurrent UTI     Past Surgical History:  Procedure Laterality Date  . ABDOMINAL HYSTERECTOMY  1994   TAH BSO  . CARPAL TUNNEL RELEASE Bilateral   . ROTATOR CUFF REPAIR Right   . TUBAL LIGATION      Current Outpatient Medications  Medication Sig Dispense Refill  . ALPRAZolam (XANAX) 1 MG tablet Take by mouth.    Marland Kitchen azelastine (ASTELIN) 0.1 % nasal spray azelastine 137 mcg (0.1 %) nasal spray aerosol    . celecoxib (CELEBREX) 100 MG capsule Take 100 mg by mouth 2 (two) times daily as needed.     . Cholecalciferol (VITAMIN D PO) Take 2,000 Units by mouth daily.     Marland Kitchen terconazole (TERAZOL 7) 0.4 % vaginal cream Place 1 applicator vaginally at bedtime. For 7  nights 45 g 0  . traMADol (ULTRAM) 50 MG tablet Take by mouth every 6 (six) hours as needed.     Current Facility-Administered Medications  Medication Dose Route Frequency Provider Last Rate Last Admin  . 0.9 %  sodium chloride infusion  500 mL Intravenous Once Doran Stabler, MD        Family History  Problem Relation Age of Onset  . Heart failure Mother   . Stroke Father   . Bone cancer Sister   . Pancreatic cancer Sister   . Colitis Daughter     Review of Systems  All other systems reviewed and are negative.   Exam:   BP 124/62 (Cuff Size: Large)   Pulse 84   Ht 5\' 3"  (1.6 m)   Wt 172 lb (78 kg)   SpO2 99%   BMI 30.47 kg/m     General appearance: alert, cooperative and appears stated age Lungs: clear to auscultation bilaterally Breasts: normal appearance, no masses or tenderness, No nipple retraction or dimpling, No nipple discharge or bleeding, No axillary adenopathy Heart: regular rate and rhythm, systolic murmur.   Abdomen: soft, non-tender; no masses, no organomegaly Extremities: extremities normal, atraumatic, no cyanosis or edema Skin: skin color, texture, turgor normal. No rashes or lesions Lymph nodes: cervical, supraclavicular, and axillary nodes normal.  Neurologic: grossly normal  Pelvic: External genitalia:  no lesions              No abnormal inguinal nodes palpated.              Urethra:  normal appearing urethra with no masses, tenderness or lesions              Bartholins and Skenes: normal                 Vagina: normal appearing vagina with normal color and discharge, no lesions, atrophy noted.               Cervix: absent              Pap taken: No. Bimanual Exam:  Uterus:  absent              Adnexa: no mass, fullness, tenderness              Rectal exam: Yes.  .  Confirms.              Anus:  normal sphincter tone, no lesions  Chaperone was present for exam.  Assessment:    Status post TAH/BSO.  Heart murmur.   FH CHF. Chronic  vaginitis.  Pelvic exam with abnormal findings.  Screening breast exam.   Plan: Mammogram screening discussed. Self breast awareness reviewed. Pap and HR HPV as above. Guidelines for Calcium, Vitamin D, regular exercise program including cardiovascular and weight bearing exercise. Vaginitis testing today.  We discussed atrophic vaginitis as well.  If her testing is negative for infection, I would recommend she try some coconut oil or [place] Jelly for hydration.  I will have the triage nurse facilitate scheduling for patient to see her PCP for cardiac evaluation.  Follow up annually and prn.   35 min  total time was spent for this patient encounter, including preparation, face-to-face counseling with the patient, coordination of care, and documentation of the encounter.

## 2020-05-25 ENCOUNTER — Other Ambulatory Visit: Payer: Self-pay

## 2020-05-25 ENCOUNTER — Encounter: Payer: Self-pay | Admitting: Obstetrics and Gynecology

## 2020-05-25 ENCOUNTER — Other Ambulatory Visit (HOSPITAL_COMMUNITY)
Admission: RE | Admit: 2020-05-25 | Discharge: 2020-05-25 | Disposition: A | Discharge status: Home / Self Care | Payer: Medicare Other | Source: Ambulatory Visit | Attending: Obstetrics and Gynecology | Admitting: Obstetrics and Gynecology

## 2020-05-25 ENCOUNTER — Telehealth: Payer: Self-pay | Admitting: Obstetrics and Gynecology

## 2020-05-25 ENCOUNTER — Ambulatory Visit (INDEPENDENT_AMBULATORY_CARE_PROVIDER_SITE_OTHER): Payer: Medicare Other | Admitting: Obstetrics and Gynecology

## 2020-05-25 VITALS — BP 124/62 | HR 84 | Ht 63.0 in | Wt 172.0 lb

## 2020-05-25 DIAGNOSIS — Z01411 Encounter for gynecological examination (general) (routine) with abnormal findings: Secondary | ICD-10-CM | POA: Diagnosis not present

## 2020-05-25 DIAGNOSIS — Z1239 Encounter for other screening for malignant neoplasm of breast: Secondary | ICD-10-CM

## 2020-05-25 DIAGNOSIS — R011 Cardiac murmur, unspecified: Secondary | ICD-10-CM

## 2020-05-25 DIAGNOSIS — N761 Subacute and chronic vaginitis: Secondary | ICD-10-CM | POA: Insufficient documentation

## 2020-05-25 DIAGNOSIS — N952 Postmenopausal atrophic vaginitis: Secondary | ICD-10-CM | POA: Diagnosis not present

## 2020-05-25 DIAGNOSIS — Z9189 Other specified personal risk factors, not elsewhere classified: Secondary | ICD-10-CM | POA: Diagnosis not present

## 2020-05-25 DIAGNOSIS — Z008 Encounter for other general examination: Secondary | ICD-10-CM

## 2020-05-25 NOTE — Patient Instructions (Signed)

## 2020-05-25 NOTE — Addendum Note (Signed)
Addended by: Yisroel Ramming, Dietrich Pates E on: 05/25/2020 02:44 PM   Modules accepted: Orders

## 2020-05-25 NOTE — Telephone Encounter (Signed)
Please schedule an appointment for patient to see her PCP.   She has a new onset cardiac murmur when I examine her.   She has a family history of congestive heart failure.

## 2020-05-25 NOTE — Telephone Encounter (Signed)
Called patient. Per DPR access note on file I left message to call me back with PCP info so I can arrange appt for her.

## 2020-05-26 LAB — CERVICOVAGINAL ANCILLARY ONLY
Bacterial Vaginitis (gardnerella): NEGATIVE
Candida Glabrata: NEGATIVE
Candida Vaginitis: NEGATIVE
Comment: NEGATIVE
Comment: NEGATIVE
Comment: NEGATIVE
Comment: NEGATIVE
Trichomonas: NEGATIVE

## 2020-05-26 NOTE — Telephone Encounter (Signed)
Patient called back with provided me with Dr.Hasanaj [phone]. I called his office and patient scheduled on 06/09/20 @ 2:45pm . Patient aware of time and date. I told her to call his office if this does work for her.

## 2020-06-09 DIAGNOSIS — K219 Gastro-esophageal reflux disease without esophagitis: Secondary | ICD-10-CM | POA: Diagnosis not present

## 2020-06-09 DIAGNOSIS — I358 Other nonrheumatic aortic valve disorders: Secondary | ICD-10-CM | POA: Diagnosis not present

## 2020-06-09 DIAGNOSIS — F064 Anxiety disorder due to known physiological condition: Secondary | ICD-10-CM | POA: Diagnosis not present

## 2020-06-09 DIAGNOSIS — M25561 Pain in right knee: Secondary | ICD-10-CM | POA: Diagnosis not present

## 2020-06-09 DIAGNOSIS — M5441 Lumbago with sciatica, right side: Secondary | ICD-10-CM | POA: Diagnosis not present

## 2020-06-14 ENCOUNTER — Encounter (INDEPENDENT_AMBULATORY_CARE_PROVIDER_SITE_OTHER): Payer: Self-pay | Admitting: *Deleted

## 2020-06-14 DIAGNOSIS — M4722 Other spondylosis with radiculopathy, cervical region: Secondary | ICD-10-CM | POA: Diagnosis not present

## 2020-06-15 ENCOUNTER — Other Ambulatory Visit (HOSPITAL_COMMUNITY): Payer: Self-pay | Admitting: Orthopedic Surgery

## 2020-06-15 DIAGNOSIS — M4722 Other spondylosis with radiculopathy, cervical region: Secondary | ICD-10-CM

## 2020-06-15 DIAGNOSIS — M25511 Pain in right shoulder: Secondary | ICD-10-CM

## 2020-06-29 ENCOUNTER — Ambulatory Visit (HOSPITAL_COMMUNITY)
Admission: RE | Admit: 2020-06-29 | Discharge: 2020-06-29 | Disposition: A | Discharge status: Home / Self Care | Payer: Medicare Other | Source: Ambulatory Visit | Attending: Orthopedic Surgery | Admitting: Orthopedic Surgery

## 2020-06-29 DIAGNOSIS — M4312 Spondylolisthesis, cervical region: Secondary | ICD-10-CM | POA: Diagnosis not present

## 2020-06-29 DIAGNOSIS — M2578 Osteophyte, vertebrae: Secondary | ICD-10-CM | POA: Diagnosis not present

## 2020-06-29 DIAGNOSIS — M25511 Pain in right shoulder: Secondary | ICD-10-CM

## 2020-06-29 DIAGNOSIS — M4722 Other spondylosis with radiculopathy, cervical region: Secondary | ICD-10-CM | POA: Diagnosis not present

## 2020-06-29 DIAGNOSIS — M4802 Spinal stenosis, cervical region: Secondary | ICD-10-CM | POA: Diagnosis not present

## 2020-07-01 DIAGNOSIS — I080 Rheumatic disorders of both mitral and aortic valves: Secondary | ICD-10-CM | POA: Diagnosis not present

## 2020-07-01 DIAGNOSIS — R011 Cardiac murmur, unspecified: Secondary | ICD-10-CM | POA: Diagnosis not present

## 2020-07-05 DIAGNOSIS — S46011D Strain of muscle(s) and tendon(s) of the rotator cuff of right shoulder, subsequent encounter: Secondary | ICD-10-CM | POA: Diagnosis not present

## 2020-07-07 DIAGNOSIS — H6123 Impacted cerumen, bilateral: Secondary | ICD-10-CM | POA: Diagnosis not present

## 2020-07-15 ENCOUNTER — Emergency Department (HOSPITAL_COMMUNITY): Payer: Medicare Other

## 2020-07-15 ENCOUNTER — Emergency Department (HOSPITAL_COMMUNITY)
Admission: EM | Admit: 2020-07-15 | Discharge: 2020-07-15 | Disposition: A | Discharge status: Home / Self Care | Payer: Medicare Other | Attending: Emergency Medicine | Admitting: Emergency Medicine

## 2020-07-15 ENCOUNTER — Encounter (HOSPITAL_COMMUNITY): Payer: Self-pay

## 2020-07-15 ENCOUNTER — Other Ambulatory Visit: Payer: Self-pay

## 2020-07-15 DIAGNOSIS — R509 Fever, unspecified: Secondary | ICD-10-CM | POA: Diagnosis not present

## 2020-07-15 DIAGNOSIS — Z79899 Other long term (current) drug therapy: Secondary | ICD-10-CM | POA: Insufficient documentation

## 2020-07-15 DIAGNOSIS — U071 COVID-19: Secondary | ICD-10-CM | POA: Diagnosis not present

## 2020-07-15 DIAGNOSIS — R55 Syncope and collapse: Secondary | ICD-10-CM | POA: Insufficient documentation

## 2020-07-15 DIAGNOSIS — R109 Unspecified abdominal pain: Secondary | ICD-10-CM | POA: Insufficient documentation

## 2020-07-15 DIAGNOSIS — R531 Weakness: Secondary | ICD-10-CM | POA: Diagnosis present

## 2020-07-15 DIAGNOSIS — M19012 Primary osteoarthritis, left shoulder: Secondary | ICD-10-CM | POA: Diagnosis not present

## 2020-07-15 LAB — COMPREHENSIVE METABOLIC PANEL
ALT: 21 U/L (ref 0–44)
AST: 24 U/L (ref 15–41)
Albumin: 4.1 g/dL (ref 3.5–5.0)
Alkaline Phosphatase: 68 U/L (ref 38–126)
Anion gap: 10 (ref 5–15)
BUN: 17 mg/dL (ref 8–23)
CO2: 26 mmol/L (ref 22–32)
Calcium: 8.6 mg/dL — ABNORMAL LOW (ref 8.9–10.3)
Chloride: 98 mmol/L (ref 98–111)
Creatinine, Ser: 0.81 mg/dL (ref 0.44–1.00)
GFR, Estimated: >60 mL/min (ref >60)
Glucose, Bld: 97 mg/dL (ref 70–99)
Potassium: 3.9 mmol/L (ref 3.5–5.1)
Sodium: 134 mmol/L — ABNORMAL LOW (ref 135–145)
Total Bilirubin: 0.6 mg/dL (ref 0.3–1.2)
Total Protein: 7.1 g/dL (ref 6.5–8.1)

## 2020-07-15 LAB — CBC WITH DIFFERENTIAL/PLATELET
Abs Immature Granulocytes: 0 10*3/uL (ref 0.00–0.07)
Basophils Absolute: 0 10*3/uL (ref 0.0–0.1)
Basophils Relative: 1 %
Eosinophils Absolute: 0 10*3/uL (ref 0.0–0.5)
Eosinophils Relative: 0 %
HCT: 45.9 % (ref 36.0–46.0)
Hemoglobin: 15.2 g/dL — ABNORMAL HIGH (ref 12.0–15.0)
Immature Granulocytes: 0 %
Lymphocytes Relative: 21 %
Lymphs Abs: 0.9 10*3/uL (ref 0.7–4.0)
MCH: 31.5 pg (ref 26.0–34.0)
MCHC: 33.1 g/dL (ref 30.0–36.0)
MCV: 95.2 fL (ref 80.0–100.0)
Monocytes Absolute: 0.6 10*3/uL (ref 0.1–1.0)
Monocytes Relative: 15 %
Neutro Abs: 2.7 10*3/uL (ref 1.7–7.7)
Neutrophils Relative %: 63 %
Platelets: 223 10*3/uL (ref 150–400)
RBC: 4.82 MIL/uL (ref 3.87–5.11)
RDW: 12.1 % (ref 11.5–15.5)
WBC: 4.3 10*3/uL (ref 4.0–10.5)
nRBC: 0 % (ref 0.0–0.2)

## 2020-07-15 LAB — RESP PANEL BY RT-PCR (FLU A&B, COVID) ARPGX2
Influenza A by PCR: NEGATIVE
Influenza B by PCR: NEGATIVE
SARS Coronavirus 2 by RT PCR: POSITIVE — AB

## 2020-07-15 LAB — LACTIC ACID, PLASMA: Lactic Acid, Venous: 0.9 mmol/L (ref 0.5–1.9)

## 2020-07-15 MED ORDER — NIRMATRELVIR/RITONAVIR (PAXLOVID)TABLET: 3.0000 | ORAL_TABLET | Freq: Two times a day (BID) | ORAL | 0 refills | Status: AC

## 2020-07-15 MED ORDER — SODIUM CHLORIDE 0.9 % IV SOLN: INTRAVENOUS | Status: DC

## 2020-07-15 NOTE — ED Provider Notes (Signed)
[facility] EMERGENCY DEPARTMENT Provider Note   CSN: [id number] Arrival date & time: 07/15/20  3149     History Chief Complaint  Patient presents with   Loss of Consciousness    Grace Thomas is a 70 y.o. female.  Patient started not feeling well on Tuesday.  Decreased appetite low energy very fatigued.  Yesterday morning in the kitchen patient passed out.  She did fall on her left shoulder which hurts.  She has felt generalized weakness and decrease in smell.  No cough no congestion no nausea vomiting or diarrhea.  But has had some abdominal discomfort.  Temp upon arrival was 99.9.  Oxygen saturations 93%.  Respiratory rate 89.  Again no chest pain or shortness of breath.      Past Medical History:  Diagnosis Date   Anxiety    Arthritis    Recurrent UTI     There are no problems to display for this patient.   Past Surgical History:  Procedure Laterality Date   ABDOMINAL HYSTERECTOMY  1994   TAH BSO   CARPAL TUNNEL RELEASE Bilateral    ROTATOR CUFF REPAIR Right    TUBAL LIGATION       OB History     Gravida  2   Para  2   Term      Preterm      AB      Living  2      SAB      IAB      Ectopic      Multiple      Live Births              Family History  Problem Relation Age of Onset   Heart failure Mother    Stroke Father    Bone cancer Sister    Pancreatic cancer Sister    Colitis Daughter     Social History   Tobacco Use   Smoking status: Never   Smokeless tobacco: Never  Vaping Use   Vaping Use: Never used  Substance Use Topics   Alcohol use: No   Drug use: No    Home Medications Prior to Admission medications   Medication Sig Start Date End Date Taking? Authorizing Provider  nirmatrelvir/ritonavir EUA (PAXLOVID) TABS Take 3 tablets by mouth 2 (two) times daily for 5 days. Patient GFR is 60. Take nirmatrelvir (150 mg) two tablets twice daily for 5 days and ritonavir (100 mg) one tablet twice daily for 5 days. 07/15/20  07/20/20 Yes Fredia Sorrow, MD  ALPRAZolam Duanne Moron) 1 MG tablet Take by mouth.    [provider]  azelastine (ASTELIN) 0.1 % nasal spray azelastine 137 mcg (0.1 %) nasal spray aerosol    [provider]  celecoxib (CELEBREX) 100 MG capsule Take 100 mg by mouth 2 (two) times daily as needed.     [provider]  Cholecalciferol (VITAMIN D PO) Take 2,000 Units by mouth daily.     [provider]  terconazole (TERAZOL 7) 0.4 % vaginal cream Place 1 applicator vaginally at bedtime. For 7 nights 11/20/17   Fontaine, Belinda Block, MD  traMADol (ULTRAM) 50 MG tablet Take by mouth every 6 (six) hours as needed.    [provider]    Allergies    Patient has no known allergies.  Review of Systems   Review of Systems  Constitutional:  Positive for activity change, appetite change and fatigue. Negative for chills and fever.  HENT:  Negative  for congestion, ear pain and sore throat.   Eyes:  Negative for pain and visual disturbance.  Respiratory:  Negative for cough and shortness of breath.   Cardiovascular:  Negative for chest pain and palpitations.  Gastrointestinal:  Positive for abdominal pain. Negative for vomiting.  Genitourinary:  Negative for dysuria and hematuria.  Musculoskeletal:  Negative for arthralgias and back pain.  Skin:  Negative for color change and rash.  Neurological:  Positive for syncope and weakness. Negative for seizures.  All other systems reviewed and are negative.  Physical Exam Updated Vital Signs BP 107/85   Pulse 88   Temp 99.9 F (37.7 C) (Oral)   Resp 18   Ht 1.6 m (5\' 3" )   Wt 78 kg   SpO2 97%   BMI 30.47 kg/m   Physical Exam Vitals and nursing note reviewed.  Constitutional:      General: She is not in acute distress.    Appearance: She is well-developed. She is not toxic-appearing or diaphoretic.  HENT:     Head: Normocephalic and atraumatic.  Eyes:     Extraocular Movements: Extraocular movements  intact.     Conjunctiva/sclera: Conjunctivae normal.     Pupils: Pupils are equal, round, and reactive to light.  Cardiovascular:     Rate and Rhythm: Normal rate and regular rhythm.     Heart sounds: No murmur heard. Pulmonary:     Effort: Pulmonary effort is normal. No respiratory distress.     Breath sounds: Normal breath sounds. No wheezing or rales.  Abdominal:     General: There is no distension.     Palpations: Abdomen is soft.     Tenderness: There is no abdominal tenderness. There is no guarding.  Musculoskeletal:        General: No swelling.     Cervical back: Normal range of motion and neck supple. No rigidity.  Skin:    General: Skin is warm and dry.     Capillary Refill: Capillary refill takes less than 2 seconds.  Neurological:     General: No focal deficit present.     Mental Status: She is alert and oriented to person, place, and time.     Cranial Nerves: No cranial nerve deficit.     Sensory: No sensory deficit.     Motor: No weakness.    ED Results / Procedures / Treatments   Labs (all labs ordered are listed, but only abnormal results are displayed) Labs Reviewed  RESP PANEL BY RT-PCR (FLU A&B, COVID) ARPGX2 - Abnormal; Notable for the following components:      Result Value   SARS Coronavirus 2 by RT PCR POSITIVE (*)    All other components within normal limits  CBC WITH DIFFERENTIAL/PLATELET - Abnormal; Notable for the following components:   Hemoglobin 15.2 (*)    All other components within normal limits  COMPREHENSIVE METABOLIC PANEL - Abnormal; Notable for the following components:   Sodium 134 (*)    Calcium 8.6 (*)    All other components within normal limits  CULTURE, BLOOD (ROUTINE X 2)  CULTURE, BLOOD (ROUTINE X 2)  LACTIC ACID, PLASMA  URINALYSIS, ROUTINE W REFLEX MICROSCOPIC    EKG EKG Interpretation  Date/Time:  Friday July 15 2020 08:44:33 EDT Ventricular Rate:  85 PR Interval:  155 QRS Duration: 84 QT Interval:  366 QTC  Calculation: 436 R Axis:   50 Text Interpretation: Sinus rhythm Consider right atrial enlargement Baseline wander in lead(s) V2 V6 No significant  change since last tracing Confirmed by Fredia Sorrow [phone]) on 07/15/2020 8:54:38 AM  Radiology DG Chest Port 1 View  Result Date: 07/15/2020 CLINICAL DATA:  Feeling sick.  Loss of smell and fever. EXAM: PORTABLE CHEST 1 VIEW COMPARISON:  None. FINDINGS: Both lungs are clear. Heart and mediastinum are within normal limits. Trachea is midline. Negative for a pneumothorax. No acute bone abnormality. IMPRESSION: No active disease. Electronically Signed   By: Markus Daft M.D.   On: 07/15/2020 09:47   DG Shoulder Left  Result Date: 07/15/2020 CLINICAL DATA:  Fall.  Left shoulder pain. EXAM: LEFT SHOULDER - 2+ VIEW COMPARISON:  None. FINDINGS: There is no evidence of fracture or dislocation. Mild glenohumeral and acromioclavicular osteoarthritis. Soft tissues are unremarkable. IMPRESSION: No acute findings. Mild degenerative changes. Electronically Signed   By: Davina Poke D.O.   On: 07/15/2020 11:09    Procedures Procedures  CRITICAL CARE Performed by: Fredia Sorrow Total critical care time: 35 minutes Critical care time was exclusive of separately billable procedures and treating other patients. Critical care was necessary to treat or prevent imminent or life-threatening deterioration. Critical care was time spent personally by me on the following activities: development of treatment plan with patient and/or surrogate as well as nursing, discussions with consultants, evaluation of patient's response to treatment, examination of patient, obtaining history from patient or surrogate, ordering and performing treatments and interventions, ordering and review of laboratory studies, ordering and review of radiographic studies, pulse oximetry and re-evaluation of patient's condition.   Medications Ordered in ED Medications  0.9 %  sodium chloride  infusion ( Intravenous Stopped 07/15/20 1326)    ED Course  I have reviewed the triage vital signs and the nursing notes.  Pertinent labs & imaging results that were available during my care of the patient were reviewed by me and considered in my medical decision making (see chart for details).    MDM Rules/Calculators/A&P                         Patient's COVID test is positive.  Patient nontoxic oxygen saturations now up in the upper 90s on room air.  Patient in no respiratory distress.  Symptoms somewhat atypical for COVID but fit the general scenario.  Patient has an elevated BMI and is 70 years old meets criteria for outpatient antivirals.  Which patient is willing to take.  Patient will be discharged home with precautions and prescription for the Paxlovid.  Final Clinical Impression(s) / ED Diagnoses Final diagnoses:  COVID    Rx / DC Orders ED Discharge Orders          Ordered    nirmatrelvir/ritonavir EUA (PAXLOVID) TABS  2 times daily        07/15/20 1328             Fredia Sorrow, MD 07/15/20 1345

## 2020-07-15 NOTE — ED Triage Notes (Signed)
Patient reports change in smell and decreased appetite with overall feeling of being "sick."

## 2020-07-15 NOTE — Discharge Instructions (Addendum)
Thick the Paxlovid as directed starting today.  Information on quarantining for the next 7 days provided above and things that she can do at home to help manage her symptoms.  Return for any new or worse symptoms return for trouble breathing.  The Paxil of it is the antiviral.  Follow-up with your doctor as needed.

## 2020-07-15 NOTE — ED Triage Notes (Addendum)
Patient reports that yesterday morning she passed out. States that she does not recall events around incident. Denies pain. States she believes that she hit her left shoulder when she fell. Unsure if hit head.

## 2020-07-20 LAB — CULTURE, BLOOD (ROUTINE X 2)
Culture: NO GROWTH
Culture: NO GROWTH
Special Requests: ADEQUATE
Special Requests: ADEQUATE

## 2020-07-25 ENCOUNTER — Ambulatory Visit: Payer: Medicare Other | Admitting: Internal Medicine

## 2020-07-26 DIAGNOSIS — S20211A Contusion of right front wall of thorax, initial encounter: Secondary | ICD-10-CM | POA: Diagnosis not present

## 2020-08-08 ENCOUNTER — Encounter: Payer: Self-pay | Admitting: Gastroenterology

## 2020-08-21 ENCOUNTER — Encounter: Payer: Self-pay | Admitting: Cardiology

## 2020-08-21 NOTE — Progress Notes (Signed)
Cardiology Office Note  Date: 08/22/2020   ID: Grace Thomas, DOB [birth year], MRN [id number]  PCP:  Grace Burly, MD  Cardiologist:  Grace Lesches, MD Electrophysiologist:  None   Chief Complaint  Patient presents with   Referred with heart murmur     History of Present Illness: Grace Thomas is a 70 y.o. female referred for cardiology consultation by Dr. Sherrie Thomas for the evaluation of aortic stenosis.  She is here today with her daughter.  She tells me that she was seen by a new gynecologist a few months ago who mentioned her having a prominent heart murmur and referred her back to see her PCP.  She was not aware of having a heart murmur nor any history of valvular heart disease.  She does have an older sister who underwent aortic valve replacement.  Echocardiogram done at [facility] back in June revealed LVEF greater than 55% with mild diastolic dysfunction, mild mitral regurgitation, mild aortic regurgitation, and severe calcific aortic stenosis with reportedly trileaflet valve and a mean gradient of 38 mmHg and dimensionless index of 0.19.  Records indicate ER visit on July 8 after an episode of poor appetite associated with fatigue and syncope.  Work-up found diagnosis of COVID-19, but she did not require hospitalization and was managed as an outpatient on Paxlovid.  She states that she has recovered completely.  In terms of symptoms, she describes herself as being very active in general, her daughter mentions that she does not seem to have the same amount of stamina over the last year, intermittently short of breath.  She does not let the slow her down however.  No obvious angina or exertional dizziness/syncope.  No palpitations.   Past Medical History:  Diagnosis Date   Anxiety    Arthritis    COVID-26 July 2020   GERD (gastroesophageal reflux disease)    Recurrent UTI     Past Surgical History:  Procedure Laterality Date   ABDOMINAL HYSTERECTOMY  1994    TAH BSO   CARPAL TUNNEL RELEASE Bilateral    ROTATOR CUFF REPAIR Right    TUBAL LIGATION      Current Outpatient Medications  Medication Sig Dispense Refill   ALPRAZolam (XANAX) 1 MG tablet Take 0.25 mg by mouth.     celecoxib (CELEBREX) 100 MG capsule Take 100 mg by mouth 2 (two) times daily as needed.      Cholecalciferol (VITAMIN D PO) Take 2,000 Units by mouth daily.      traMADol (ULTRAM) 50 MG tablet Take by mouth every 6 (six) hours as needed.     azelastine (ASTELIN) 0.1 % nasal spray azelastine 137 mcg (0.1 %) nasal spray aerosol (Patient not taking: Reported on 08/22/2020)     terconazole (TERAZOL 7) 0.4 % vaginal cream Place 1 applicator vaginally at bedtime. For 7 nights (Patient not taking: Reported on 08/22/2020) 45 g 0   Current Facility-Administered Medications  Medication Dose Route Frequency Provider Last Rate Last Admin   0.9 %  sodium chloride infusion  500 mL Intravenous Once Doran Stabler, MD       Allergies:  Patient has no known allergies.   Social History: The patient  reports that she has never smoked. She has never used smokeless tobacco. She reports that she does not drink alcohol and does not use drugs.   Family History: The patient's family history includes Bone cancer in her sister; Colitis in her daughter; Heart failure  in her mother; Pancreatic cancer in her sister; Stroke in her father.   ROS: No orthopnea or PND.  No leg swelling.  Physical Exam: VS:  BP 104/66   Pulse 81   Ht '5\' 3"'$  (1.6 m)   Wt 166 lb 9.6 oz (75.6 kg)   SpO2 99%   BMI 29.51 kg/m , BMI Body mass index is 29.51 kg/m.  Wt Readings from Last 3 Encounters:  08/22/20 166 lb 9.6 oz (75.6 kg)  07/15/20 172 lb (78 kg)  05/25/20 172 lb (78 kg)    General: Patient appears comfortable at rest. HEENT: Conjunctiva and lids normal, wearing a mask. Neck: Supple, no elevated JVP, referred cardiac monitor murmur to carotids, no thyromegaly. Lungs: Clear to auscultation, nonlabored  breathing at rest. Cardiac: Regular rate and rhythm, no S3, 3/6 systolic murmur, no pericardial rub. Abdomen: Soft, nontender, bowel sounds present. Extremities: No pitting edema, distal pulses 2+. Skin: Warm and dry. Musculoskeletal: No kyphosis. Neuropsychiatric: Alert and oriented x3, affect grossly appropriate.  ECG:  An ECG dated 07/15/2020 was personally reviewed today and demonstrated:  Sinus rhythm.  Recent Labwork: 07/15/2020: ALT 21; AST 24; BUN 17; Creatinine, Ser 0.81; Hemoglobin 15.2; Platelets 223; Potassium 3.9; Sodium 134   Other Studies Reviewed Today:  Echocardiogram 07/01/2020 [facility]): Summary  1. The left ventricle is normal in size with upper normal wall thickness.  2. The left ventricular systolic function is normal, LVEF is visually estimated at > 55%.  3. There is grade I diastolic dysfunction (impaired relaxation).  4. There is mild mitral valve regurgitation.  5. There is mild aortic regurgitation.  6. There is severe aortic valve stenosis.  7. The left atrium is mildly to moderately dilated in size.  8. The right ventricle is normal in size, with normal systolic function.   Left Ventricle  The left ventricle is normal in size with upper normal wall thickness.  The left ventricular systolic function is normal, LVEF is visually estimated at > 55%.  There is grade I diastolic dysfunction (impaired relaxation).  Right Ventricle  The right ventricle is normal in size, with normal systolic function.   Left Atrium  The left atrium is mildly to moderately dilated in size.  Right Atrium  The right atrium is normal  in size.   Aortic Valve  The aortic valve is trileaflet with severely thickened leaflets with severely reduced excursion.  There is mild aortic regurgitation.  There is severe aortic valve stenosis.  Peak AV transvalvular velocity:  4.1 m/s.  Mean gradient: 38 mmHg.  Doppler velocity index: 0.19.  Estimated aortic valve area (VTI):  0.7 cm2.  Estimated aortic valve area (velocity): 0.6 cm2.  LVOT diameter: 2.0 cm.  LV stroke volume index: 15.1 ml/m2.  Pulmonic Valve  The pulmonic valve is normal.  There is trivial pulmonic regurgitation.  There is no evidence of a significant transvalvular gradient.  Mitral Valve  The mitral valve leaflets are mildly thickened with normal leaflet mobility.  Mitral annular calcification is present.  There is mild mitral valve regurgitation.  Tricuspid Valve  The tricuspid valve leaflets are normal, with normal leaflet mobility.  There is no significant tricuspid regurgitation.  The pulmonary systolic pressure cannot be estimated due to insufficient TR jet.  Other Findings  Rhythm: Sinus Rhythm.  Pericardium/Pleural  There is no pericardial effusion.  Inferior Vena Cava  IVC size and inspiratory change suggest normal right atrial pressure. (0-5 mmHg).  Aorta  The aorta is normal in size  in the visualized segments.  Chest x-ray 07/15/2020: FINDINGS: Both lungs are clear. Heart and mediastinum are within normal limits. Trachea is midline. Negative for a pneumothorax. No acute bone abnormality.   IMPRESSION: No active disease.  Assessment and Plan:  1.  Severe calcific aortic stenosis with mean gradient 38 mmHg and dimensionless index 0.19.  Valve described as trileaflet by echocardiogram at [facility], although I would be suspicious of at least a functionally bicuspid valve given degree of aortic stenosis at age 61.  I cannot review these images at this time.  We have discussed the natural course of aortic stenosis and treatment options, plan is to refer her to our structural heart team for further consultation.  2.  COVID-19 infection back in July, managed as an outpatient and back to baseline.  No obvious sequela.  3.  History of anxiety, on Xanax.  Medication Adjustments/Labs and Tests Ordered: Current medicines are reviewed at length with the patient today.   Concerns regarding medicines are outlined above.   Tests Ordered: Orders Placed This Encounter  Procedures   Ambulatory referral to Structural Heart/Valve Clinic (only at [place])     Medication Changes: No orders of the defined types were placed in this encounter.   Disposition:  Follow up  6 months  Signed, Satira Sark, MD, [facility] 08/22/2020 9:23 AM    [facility] [place]. [address], [place],  [address] Phone: [phone]; Fax: [phone]

## 2020-08-22 ENCOUNTER — Encounter: Payer: Self-pay | Admitting: Cardiology

## 2020-08-22 ENCOUNTER — Other Ambulatory Visit: Payer: Self-pay

## 2020-08-22 ENCOUNTER — Ambulatory Visit (INDEPENDENT_AMBULATORY_CARE_PROVIDER_SITE_OTHER): Payer: Medicare Other | Admitting: Cardiology

## 2020-08-22 VITALS — BP 104/66 | HR 81 | Ht 63.0 in | Wt 166.6 lb

## 2020-08-22 DIAGNOSIS — I350 Nonrheumatic aortic (valve) stenosis: Secondary | ICD-10-CM | POA: Diagnosis not present

## 2020-08-22 NOTE — Patient Instructions (Signed)
Medication Instructions:  Your physician recommends that you continue on your current medications as directed. Please refer to the Current Medication list given to you today.  *If you need a refill on your cardiac medications before your next appointment, please call your pharmacy*   Lab Work: None today  If you have labs (blood work) drawn today and your tests are completely normal, you will receive your results only by: [place] (if you have MyChart) OR A paper copy in the mail If you have any lab test that is abnormal or we need to change your treatment, we will call you to review the results.   Testing/Procedures: None today    Follow-Up: At [facility], you and your health needs are our priority.  As part of our continuing mission to provide you with exceptional heart care, we have created designated Provider Care Teams.  These Care Teams include your primary Cardiologist (physician) and Advanced Practice Providers (APPs -  Physician Assistants and Nurse Practitioners) who all work together to provide you with the care you need, when you need it.  We recommend signing up for the patient portal called "MyChart".  Sign up information is provided on this After Visit Summary.  MyChart is used to connect with patients for Virtual Visits (Telemedicine).  Patients are able to view lab/test results, encounter notes, upcoming appointments, etc.  Non-urgent messages can be sent to your provider as well.   To learn more about what you can do with MyChart, go to [email or web address].    Your next appointment:   6 month(s)  The format for your next appointment:   In Person  Provider:   Rozann Lesches, MD   Other Instructions You have been referred to [facility] in [place]. They will call you to arrange an appointment.

## 2020-08-25 ENCOUNTER — Other Ambulatory Visit: Payer: Self-pay

## 2020-08-25 ENCOUNTER — Encounter: Payer: Self-pay | Admitting: Cardiovascular Disease

## 2020-08-25 ENCOUNTER — Ambulatory Visit (INDEPENDENT_AMBULATORY_CARE_PROVIDER_SITE_OTHER): Payer: Medicare Other | Admitting: Cardiovascular Disease

## 2020-08-25 ENCOUNTER — Ambulatory Visit (HOSPITAL_COMMUNITY): Payer: Medicare Other | Attending: Cardiovascular Disease

## 2020-08-25 VITALS — BP 122/64 | HR 73 | Ht 63.0 in | Wt 165.2 lb

## 2020-08-25 DIAGNOSIS — Z01812 Encounter for preprocedural laboratory examination: Secondary | ICD-10-CM

## 2020-08-25 DIAGNOSIS — Z01810 Encounter for preprocedural cardiovascular examination: Secondary | ICD-10-CM | POA: Diagnosis not present

## 2020-08-25 DIAGNOSIS — I350 Nonrheumatic aortic (valve) stenosis: Secondary | ICD-10-CM

## 2020-08-25 DIAGNOSIS — Z01818 Encounter for other preprocedural examination: Secondary | ICD-10-CM | POA: Diagnosis not present

## 2020-08-25 LAB — ECHOCARDIOGRAM COMPLETE
AR max vel: 0.88 cm2
AV Area VTI: 0.79 cm2
AV Area mean vel: 0.78 cm2
AV Mean grad: 46 mmHg
AV Peak grad: 70.6 mmHg
Ao pk vel: 4.2 m/s
Area-P 1/2: 2.43 cm2
Height: 63 in
MV VTI: 2.39 cm2
P 1/2 time: 461 msec
S' Lateral: 3.1 cm
Weight: 2643.2 oz

## 2020-08-25 NOTE — Progress Notes (Signed)
HEART AND VASCULAR CENTER   MULTIDISCIPLINARY HEART VALVE TEAM  Date:  08/25/2020   ID:  Grace Thomas, DOB Oct 25, 1950, MRN [id number]  PCP:  Neale Burly, MD   Chief Complaint  Patient presents with   Aortic Stenosis      HISTORY OF PRESENT ILLNESS: Grace Thomas is a 70 y.o. female who presents for evaluation of aortic stenosis, referred by Dr Domenic Polite.   The patient was recently seen in consultation by Dr. Domenic Polite after an echocardiogram at her primary care office demonstrated severe aortic stenosis.  This reportedly demonstrated an LVEF greater than 55% with mild mitral regurgitation, mild aortic regurgitation, and severe calcific aortic stenosis with a mean transvalvular gradient of 38 mmHg and dimensionless index of 0.19.  The patient was first told of a heart murmur at a recent gynecology evaluation earlier this year.  She was referred back to her primary care physician and underwent an echocardiogram demonstrating severe aortic stenosis. She is here with her daughter today. She sometimes feels a heaviness in her chest with physical activity such as walking. She describes it as an 'uncomfortable' feeling but not a pain. She does have some shortness of breath with moderate level activities such as washing her car. She also has developed a degree of exercise intolerance that her daughter has noticed. She states that the patient tires more easily with walking than she has in the past. She has lightheadedness intermittently but no lightheadedness associated with physical exertion. She had an episode of syncope a few months ago when she had Covid-19., but has fully recovered.   The patient is married. She has 2 daughters, both live locally. 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 She is a lifelong non-smoker. She has had carpal tunnel surgery and rotator cuff surgery. She has had no major abdominal or chest  surgeries.   The patient has had regular dental care and reports no problems with her teeth.   Past Medical History:  Diagnosis Date   Anxiety    Arthritis    COVID-26 July 2020   GERD (gastroesophageal reflux disease)    Recurrent UTI     Current Outpatient Medications  Medication Sig Dispense Refill   ALPRAZolam (XANAX) 1 MG tablet Take 0.25 mg by mouth.     azelastine (ASTELIN) 0.1 % nasal spray azelastine 137 mcg (0.1 %) nasal spray aerosol (Patient not taking: No sig reported)     celecoxib (CELEBREX) 100 MG capsule Take 100 mg by mouth 2 (two) times daily as needed.      Cholecalciferol (VITAMIN D PO) Take 2,000 Units by mouth daily.      terconazole (TERAZOL 7) 0.4 % vaginal cream Place 1 applicator vaginally at bedtime. For 7 nights (Patient not taking: No sig reported) 45 g 0   traMADol (ULTRAM) 50 MG tablet Take by mouth every 6 (six) hours as needed.     Current Facility-Administered Medications  Medication Dose Route Frequency Provider Last Rate Last Admin   0.9 %  sodium chloride infusion  500 mL Intravenous Once Doran Stabler, MD        ALLERGIES:   Patient has no known allergies.   SOCIAL HISTORY:  The patient  reports that she has never smoked. She has never used smokeless tobacco. She reports that she does not drink alcohol and does not use drugs.   FAMILY HISTORY:  The patient's family history includes Bone cancer in her sister; Colitis in her daughter; Heart failure in her mother; Pancreatic cancer in her sister; Stroke in her father.   REVIEW OF SYSTEMS:  Positive for back pain, shoulder pain, arthritis.   All other systems are reviewed and negative.   PHYSICAL EXAM: VS:  BP 122/64   Pulse 73   Ht '5\' 3"'$  (1.6 m)   Wt 165 lb 3.2 oz (74.9 kg)   SpO2 96%   BMI 29.26 kg/m  , BMI Body mass index is 29.26 kg/m. GEN: Well nourished, well developed, in no acute distress HEENT: normal Neck: No JVD. carotids 2+ without bruits or masses Cardiac: The  heart is RRR with a 3/6 mid peaking crescendo decrescendo murmur at the right upper sternal border, A2 is present.  No edema. Pedal pulses 2+ = bilaterally  Respiratory:  clear to auscultation bilaterally GI: soft, nontender, nondistended, + BS MS: no deformity or atrophy Skin: warm and dry, no rash Neuro:  Strength and sensation are intact Psych: euthymic mood, full affect  EKG:  EKG from 07/15/20 reviewed and demonstrates NSR 85 bpm, within normal limits  RECENT LABS: 07/15/2020: ALT 21; BUN 17; Creatinine, Ser 0.81; Hemoglobin 15.2; Platelets 223; Potassium 3.9; Sodium 134  No results found for requested labs within last 8760 hours.   CrCl cannot be calculated (Patient's most recent lab result is older than the maximum 21 days allowed.).   Wt Readings from Last 3 Encounters:  08/25/20 165 lb 3.2 oz (74.9 kg)  08/22/20 166 lb 9.6 oz (75.6 kg)  07/15/20 172 lb (78 kg)     CARDIAC STUDIES: Echo: Images not available for review.  Reported findings as above in the HPI.  STS RISK CALCULATOR: Isolated AVR: Risk of Mortality: 1.543% Renal Failure: 0.711% Permanent Stroke: 1.097% Prolonged Ventilation: 4.669% DSW Infection: 0.060% Reoperation: 2.825% Morbidity or Mortality: 8.090% Short Length of Stay: 51.519% Long Length of Stay: 2.829%  ASSESSMENT AND PLAN: 23.  70 year old woman with probable severe, stage D1 aortic stenosis.  The patient reports New York Heart Association functional class II symptoms of chest discomfort, mild shortness of breath with activity, and fatigue.  Her physical exam is suggestive of moderately severe aortic stenosis.  Echo report is reviewed with LVEF greater than 55%.  Peak transaortic velocity is 4.1 m/s, mean gradient 38 mmHg, dimensionless index 0.19, estimated aortic valve area 0.7 cm.  No other significant abnormalities identified.  I have reviewed the natural history of aortic stenosis with the patient and their family members who are  present today. We have discussed the limitations of medical therapy and the poor prognosis associated with symptomatic aortic stenosis. We have reviewed potential treatment options, including palliative medical therapy, conventional surgical aortic valve replacement, and transcatheter aortic valve replacement. We discussed treatment options in the context of the patient's specific comorbid medical conditions.  The patient's only significant medical comorbidity seems to be related to osteoarthritis and mild functional limitation with this.  I have recommended a repeat echocardiogram so that images can be reviewed and findings confirmed.  I have recommended a right and left heart catheterization to assess aortic valve hemodynamics, right heart pressures, and the presence of coronary artery disease in this patient with exertional chest discomfort. I have reviewed the risks, indications, and alternatives to cardiac catheterization, possible angioplasty, and stenting with the patient. Risks include but are not limited to bleeding, infection, vascular injury, stroke, myocardial infection, arrhythmia, kidney injury, radiation-related injury in the case  of prolonged fluoroscopy use, emergency cardiac surgery, and death. The patient understands the risks of serious complication is 1-2 in 123XX123 with diagnostic cardiac cath and 1-2% or less with angioplasty/stenting.  The patient understands that pending the findings of her echocardiogram and cardiac catheterization, we will determine whether aortic valve replacement is indicated now or whether she can be followed with close surveillance.  I suspect based on her early symptoms and findings reported on outside echo, that she truly does have severe aortic stenosis that will require treatment.  Once these initial studies are completed, she will likely be referred for CT angiogram studies of the heart and the chest, abdomen, and pelvis, followed by formal cardiac surgical  consultation as part of a multidisciplinary approach to her care.  Deatra James 08/25/2020 10:07 AM     [facility] [address] [place] [address]  [phone] (office) [phone] (fax)

## 2020-08-25 NOTE — Patient Instructions (Addendum)
Medication Instructions:  Your physician recommends that you continue on your current medications as directed. Please refer to the Current Medication list given to you today.  *If you need a refill on your cardiac medications before your next appointment, please call your pharmacy*  Lab Work: If you have labs (blood work) drawn today and your tests are completely normal, you will receive your results only by: [place] (if you have MyChart) OR A paper copy in the mail If you have any lab test that is abnormal or we need to change your treatment, we will call you to review the results.  Testing/Procedures: Your physician has requested that you have an echocardiogram. Echocardiography is a painless test that uses sound waves to create images of your heart. It provides your doctor with information about the size and shape of your heart and how well your heart's chambers and valves are working. This procedure takes approximately one hour. There are no restrictions for this procedure.  Your physician has requested that you have a cardiac catheterization. Cardiac catheterization is used to diagnose and/or treat various heart conditions. Doctors may recommend this procedure for a number of different reasons. The most common reason is to evaluate chest pain. Chest pain can be a symptom of coronary artery disease (CAD), and cardiac catheterization can show whether plaque is narrowing or blocking your heart's arteries. This procedure is also used to evaluate the valves, as well as measure the blood flow and oxygen levels in different parts of your heart. For further information please visit [email or web address]. Please follow instruction sheet, as given.   Follow-Up: At [facility], you and your health needs are our priority.  As part of our continuing mission to provide you with exceptional heart care, we have created designated Provider Care Teams.  These Care Teams include your primary Cardiologist  (physician) and Advanced Practice Providers (APPs -  Physician Assistants and Nurse Practitioners) who all work together to provide you with the care you need, when you need it.  We recommend signing up for the patient portal called "MyChart".  Sign up information is provided on this After Visit Summary.  MyChart is used to connect with patients for Virtual Visits (Telemedicine).  Patients are able to view lab/test results, encounter notes, upcoming appointments, etc.  Non-urgent messages can be sent to your provider as well.   To learn more about what you can do with MyChart, go to [email or web address].    Your next appointment:   Someone will call to schedule   Other Instructions  [place] OFFICE [place], [place]  [place] [address] Dept: [phone] Loc: [place]  08/25/2020  You are scheduled for a Cardiac Catheterization on Monday, August 29 with Dr. Sherren Mocha.  1. Please arrive at the [facility] (Main Entrance A) at [facility]: [address] [place], [place] [address] at 8:30 AM (This time is two hours before your procedure to ensure your preparation). Free valet parking service is available.   Special note: Every effort is made to have your procedure done on time. Please understand that emergencies sometimes delay scheduled procedures.  2. Diet: Do not eat solid foods after midnight.  The patient may have clear liquids until 5am upon the day of the procedure.  3. Labs: You will need to have blood drawn on Friday, August 22 at [facility]. [address],  [place]  Open: 7:30am - 5pm    Phone: [phone]. You do not need to be fasting.  4. Medication instructions in preparation for your procedure:   Contrast Allergy: No  On the morning of your procedure, take your Aspirin and any morning medicines NOT listed  above.  You may use sips of water.  5. Plan for one night stay--bring personal belongings. 6. Bring a current list of your medications and current insurance cards. 7. You MUST have a responsible person to drive you home. 8. Someone MUST be with you the first 24 hours after you arrive home or your discharge will be delayed. 9. Please wear clothes that are easy to get on and off and wear slip-on shoes.  Thank you for allowing [place] to care for you!   -- [facility]

## 2020-08-25 NOTE — H&P (View-Only) (Signed)
HEART AND VASCULAR CENTER   MULTIDISCIPLINARY HEART VALVE TEAM  Date:  08/25/2020   ID:  Grace Thomas, DOB 1950/06/26, MRN [id number]  PCP:  Neale Burly, MD   Chief Complaint  Patient presents with   Aortic Stenosis      HISTORY OF PRESENT ILLNESS: Grace Thomas is a 70 y.o. female who presents for evaluation of aortic stenosis, referred by Dr Domenic Polite.   The patient was recently seen in consultation by Dr. Domenic Polite after an echocardiogram at her primary care office demonstrated severe aortic stenosis.  This reportedly demonstrated an LVEF greater than 55% with mild mitral regurgitation, mild aortic regurgitation, and severe calcific aortic stenosis with a mean transvalvular gradient of 38 mmHg and dimensionless index of 0.19.  The patient was first told of a heart murmur at a recent gynecology evaluation earlier this year.  She was referred back to her primary care physician and underwent an echocardiogram demonstrating severe aortic stenosis. She is here with her daughter today. She sometimes feels a heaviness in her chest with physical activity such as walking. She describes it as an 'uncomfortable' feeling but not a pain. She does have some shortness of breath with moderate level activities such as washing her car. She also has developed a degree of exercise intolerance that her daughter has noticed. She states that the patient tires more easily with walking than she has in the past. She has lightheadedness intermittently but no lightheadedness associated with physical exertion. She had an episode of syncope a few months ago when she had Covid-19., but has fully recovered.   The patient is married. She has 2 daughters, both live locally. 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 She is a lifelong non-smoker. She has had carpal tunnel surgery and rotator cuff surgery. She has had no major abdominal or chest  surgeries.   The patient has had regular dental care and reports no problems with her teeth.   Past Medical History:  Diagnosis Date   Anxiety    Arthritis    COVID-26 July 2020   GERD (gastroesophageal reflux disease)    Recurrent UTI     Current Outpatient Medications  Medication Sig Dispense Refill   ALPRAZolam (XANAX) 1 MG tablet Take 0.25 mg by mouth.     azelastine (ASTELIN) 0.1 % nasal spray azelastine 137 mcg (0.1 %) nasal spray aerosol (Patient not taking: No sig reported)     celecoxib (CELEBREX) 100 MG capsule Take 100 mg by mouth 2 (two) times daily as needed.      Cholecalciferol (VITAMIN D PO) Take 2,000 Units by mouth daily.      terconazole (TERAZOL 7) 0.4 % vaginal cream Place 1 applicator vaginally at bedtime. For 7 nights (Patient not taking: No sig reported) 45 g 0   traMADol (ULTRAM) 50 MG tablet Take by mouth every 6 (six) hours as needed.     Current Facility-Administered Medications  Medication Dose Route Frequency Provider Last Rate Last Admin   0.9 %  sodium chloride infusion  500 mL Intravenous Once Doran Stabler, MD        ALLERGIES:   Patient has no known allergies.   SOCIAL HISTORY:  The patient  reports that she has never smoked. She has never used smokeless tobacco. She reports that she does not drink alcohol and does not use drugs.   FAMILY HISTORY:  The patient's family history includes Bone cancer in her sister; Colitis in her daughter; Heart failure in her mother; Pancreatic cancer in her sister; Stroke in her father.   REVIEW OF SYSTEMS:  Positive for back pain, shoulder pain, arthritis.   All other systems are reviewed and negative.   PHYSICAL EXAM: VS:  BP 122/64   Pulse 73   Ht '5\' 3"'$  (1.6 m)   Wt 165 lb 3.2 oz (74.9 kg)   SpO2 96%   BMI 29.26 kg/m  , BMI Body mass index is 29.26 kg/m. GEN: Well nourished, well developed, in no acute distress HEENT: normal Neck: No JVD. carotids 2+ without bruits or masses Cardiac: The  heart is RRR with a 3/6 mid peaking crescendo decrescendo murmur at the right upper sternal border, A2 is present.  No edema. Pedal pulses 2+ = bilaterally  Respiratory:  clear to auscultation bilaterally GI: soft, nontender, nondistended, + BS MS: no deformity or atrophy Skin: warm and dry, no rash Neuro:  Strength and sensation are intact Psych: euthymic mood, full affect  EKG:  EKG from 07/15/20 reviewed and demonstrates NSR 85 bpm, within normal limits  RECENT LABS: 07/15/2020: ALT 21; BUN 17; Creatinine, Ser 0.81; Hemoglobin 15.2; Platelets 223; Potassium 3.9; Sodium 134  No results found for requested labs within last 8760 hours.   CrCl cannot be calculated (Patient's most recent lab result is older than the maximum 21 days allowed.).   Wt Readings from Last 3 Encounters:  08/25/20 165 lb 3.2 oz (74.9 kg)  08/22/20 166 lb 9.6 oz (75.6 kg)  07/15/20 172 lb (78 kg)     CARDIAC STUDIES: Echo: Images not available for review.  Reported findings as above in the HPI.  STS RISK CALCULATOR: Isolated AVR: Risk of Mortality: 1.543% Renal Failure: 0.711% Permanent Stroke: 1.097% Prolonged Ventilation: 4.669% DSW Infection: 0.060% Reoperation: 2.825% Morbidity or Mortality: 8.090% Short Length of Stay: 51.519% Long Length of Stay: 2.829%  ASSESSMENT AND PLAN: 54.  70 year old woman with probable severe, stage D1 aortic stenosis.  The patient reports New York Heart Association functional class II symptoms of chest discomfort, mild shortness of breath with activity, and fatigue.  Her physical exam is suggestive of moderately severe aortic stenosis.  Echo report is reviewed with LVEF greater than 55%.  Peak transaortic velocity is 4.1 m/s, mean gradient 38 mmHg, dimensionless index 0.19, estimated aortic valve area 0.7 cm.  No other significant abnormalities identified.  I have reviewed the natural history of aortic stenosis with the patient and their family members who are  present today. We have discussed the limitations of medical therapy and the poor prognosis associated with symptomatic aortic stenosis. We have reviewed potential treatment options, including palliative medical therapy, conventional surgical aortic valve replacement, and transcatheter aortic valve replacement. We discussed treatment options in the context of the patient's specific comorbid medical conditions.  The patient's only significant medical comorbidity seems to be related to osteoarthritis and mild functional limitation with this.  I have recommended a repeat echocardiogram so that images can be reviewed and findings confirmed.  I have recommended a right and left heart catheterization to assess aortic valve hemodynamics, right heart pressures, and the presence of coronary artery disease in this patient with exertional chest discomfort. I have reviewed the risks, indications, and alternatives to cardiac catheterization, possible angioplasty, and stenting with the patient. Risks include but are not limited to bleeding, infection, vascular injury, stroke, myocardial infection, arrhythmia, kidney injury, radiation-related injury in the case  of prolonged fluoroscopy use, emergency cardiac surgery, and death. The patient understands the risks of serious complication is 1-2 in 123XX123 with diagnostic cardiac cath and 1-2% or less with angioplasty/stenting.  The patient understands that pending the findings of her echocardiogram and cardiac catheterization, we will determine whether aortic valve replacement is indicated now or whether she can be followed with close surveillance.  I suspect based on her early symptoms and findings reported on outside echo, that she truly does have severe aortic stenosis that will require treatment.  Once these initial studies are completed, she will likely be referred for CT angiogram studies of the heart and the chest, abdomen, and pelvis, followed by formal cardiac surgical  consultation as part of a multidisciplinary approach to her care.  Deatra James 08/25/2020 10:07 AM     [facility] [address] [place] [address]  [phone] (office) [phone] (fax)

## 2020-08-29 ENCOUNTER — Other Ambulatory Visit: Payer: Medicare Other

## 2020-08-30 ENCOUNTER — Other Ambulatory Visit: Payer: Medicare Other

## 2020-08-31 ENCOUNTER — Other Ambulatory Visit: Payer: Self-pay

## 2020-08-31 ENCOUNTER — Other Ambulatory Visit: Payer: Medicare Other | Admitting: *Deleted

## 2020-08-31 DIAGNOSIS — M545 Low back pain, unspecified: Secondary | ICD-10-CM | POA: Diagnosis not present

## 2020-08-31 DIAGNOSIS — Z01812 Encounter for preprocedural laboratory examination: Secondary | ICD-10-CM

## 2020-08-31 DIAGNOSIS — M542 Cervicalgia: Secondary | ICD-10-CM | POA: Diagnosis not present

## 2020-08-31 DIAGNOSIS — M5412 Radiculopathy, cervical region: Secondary | ICD-10-CM | POA: Diagnosis not present

## 2020-08-31 DIAGNOSIS — I350 Nonrheumatic aortic (valve) stenosis: Secondary | ICD-10-CM | POA: Diagnosis not present

## 2020-08-31 DIAGNOSIS — M47816 Spondylosis without myelopathy or radiculopathy, lumbar region: Secondary | ICD-10-CM | POA: Diagnosis not present

## 2020-08-31 DIAGNOSIS — M4316 Spondylolisthesis, lumbar region: Secondary | ICD-10-CM | POA: Diagnosis not present

## 2020-08-31 DIAGNOSIS — M5416 Radiculopathy, lumbar region: Secondary | ICD-10-CM | POA: Diagnosis not present

## 2020-08-31 DIAGNOSIS — Z6829 Body mass index (BMI) 29.0-29.9, adult: Secondary | ICD-10-CM | POA: Diagnosis not present

## 2020-08-31 DIAGNOSIS — M47812 Spondylosis without myelopathy or radiculopathy, cervical region: Secondary | ICD-10-CM | POA: Diagnosis not present

## 2020-09-01 ENCOUNTER — Telehealth: Payer: Self-pay | Admitting: *Deleted

## 2020-09-01 LAB — CBC WITH DIFFERENTIAL/PLATELET
Basophils Absolute: 0.1 10*3/uL (ref 0.0–0.2)
Basos: 1 %
EOS (ABSOLUTE): 0.1 10*3/uL (ref 0.0–0.4)
Eos: 1 %
Hematocrit: 38.6 % (ref 34.0–46.6)
Hemoglobin: 12.9 g/dL (ref 11.1–15.9)
Immature Grans (Abs): 0 10*3/uL (ref 0.0–0.1)
Immature Granulocytes: 0 %
Lymphocytes Absolute: 2.7 10*3/uL (ref 0.7–3.1)
Lymphs: 32 %
MCH: 31.5 pg (ref 26.6–33.0)
MCHC: 33.4 g/dL (ref 31.5–35.7)
MCV: 94 fL (ref 79–97)
Monocytes Absolute: 0.5 10*3/uL (ref 0.1–0.9)
Monocytes: 6 %
Neutrophils Absolute: 5 10*3/uL (ref 1.4–7.0)
Neutrophils: 60 %
Platelets: 274 10*3/uL (ref 150–450)
RBC: 4.09 x10E6/uL (ref 3.77–5.28)
RDW: 12.3 % (ref 11.7–15.4)
WBC: 8.4 10*3/uL (ref 3.4–10.8)

## 2020-09-01 LAB — BASIC METABOLIC PANEL
BUN/Creatinine Ratio: 14 (ref 12–28)
BUN: 9 mg/dL (ref 8–27)
CO2: 24 mmol/L (ref 20–29)
Calcium: 9.2 mg/dL (ref 8.7–10.3)
Chloride: 105 mmol/L (ref 96–106)
Creatinine, Ser: 0.63 mg/dL (ref 0.57–1.00)
Glucose: 85 mg/dL (ref 65–99)
Potassium: 3.9 mmol/L (ref 3.5–5.2)
Sodium: 147 mmol/L — ABNORMAL HIGH (ref 134–144)
eGFR: 95 mL/min/{1.73_m2} (ref >59)

## 2020-09-01 NOTE — Telephone Encounter (Signed)
Cardiac catheterization scheduled at [facility] for: Monday September 05, 2020 10:30 AM [facility] Main Entrance A [facility]) at: 8:30 AM   No solid food after midnight prior to cath, clear liquids until 5 AM day of procedure.   Morning medications can be taken pre-cath with sips of water including aspirin 81 mg.    Confirmed patient has responsible adult to drive home post procedure and be with patient first 24 hours after arriving home.  Patients are allowed one visitor in the waiting room during the time they are at the hospital for their procedure. Both patient and visitor must wear a mask once they enter the hospital.   Patient reports does not currently have any symptoms concerning for COVID-19 and no household members with COVID-19 like illness.      Reviewed procedure/mask/visitor instructions with patient.

## 2020-09-05 ENCOUNTER — Other Ambulatory Visit: Payer: Self-pay

## 2020-09-05 ENCOUNTER — Encounter (HOSPITAL_COMMUNITY): Payer: Self-pay | Admitting: Cardiovascular Disease

## 2020-09-05 ENCOUNTER — Ambulatory Visit (HOSPITAL_COMMUNITY)
Admission: RE | Admit: 2020-09-05 | Discharge: 2020-09-05 | Disposition: A | Discharge status: Home / Self Care | Payer: Medicare Other | Attending: Cardiovascular Disease | Admitting: Cardiovascular Disease

## 2020-09-05 ENCOUNTER — Encounter (HOSPITAL_COMMUNITY)
Admission: RE | Disposition: A | Discharge status: Home / Self Care | Payer: Self-pay | Source: Home / Self Care | Attending: Cardiovascular Disease

## 2020-09-05 DIAGNOSIS — Z8616 Personal history of COVID-19: Secondary | ICD-10-CM | POA: Insufficient documentation

## 2020-09-05 DIAGNOSIS — I350 Nonrheumatic aortic (valve) stenosis: Secondary | ICD-10-CM

## 2020-09-05 DIAGNOSIS — I2510 Atherosclerotic heart disease of native coronary artery without angina pectoris: Secondary | ICD-10-CM

## 2020-09-05 DIAGNOSIS — Z8249 Family history of ischemic heart disease and other diseases of the circulatory system: Secondary | ICD-10-CM | POA: Diagnosis not present

## 2020-09-05 DIAGNOSIS — Z79899 Other long term (current) drug therapy: Secondary | ICD-10-CM | POA: Insufficient documentation

## 2020-09-05 HISTORY — PX: RIGHT/LEFT HEART CATH AND CORONARY ANGIOGRAPHY: CATH118266

## 2020-09-05 LAB — POCT I-STAT EG7
Acid-Base Excess: 1 mmol/L (ref 0.0–2.0)
Bicarbonate: 26.5 mmol/L (ref 20.0–28.0)
Calcium, Ion: 1.07 mmol/L — ABNORMAL LOW (ref 1.15–1.40)
HCT: 33 % — ABNORMAL LOW (ref 36.0–46.0)
Hemoglobin: 11.2 g/dL — ABNORMAL LOW (ref 12.0–15.0)
O2 Saturation: 73 %
Potassium: 3.4 mmol/L — ABNORMAL LOW (ref 3.5–5.1)
Sodium: 146 mmol/L — ABNORMAL HIGH (ref 135–145)
TCO2: 28 mmol/L (ref 22–32)
pCO2, Ven: 45.9 mmHg (ref 44.0–60.0)
pH, Ven: 7.369 (ref 7.250–7.430)
pO2, Ven: 40 mmHg (ref 32.0–45.0)

## 2020-09-05 SURGERY — RIGHT/LEFT HEART CATH AND CORONARY ANGIOGRAPHY
Anesthesia: LOCAL

## 2020-09-05 MED ORDER — LABETALOL HCL 5 MG/ML IV SOLN: 10.0000 mg | INTRAVENOUS | Status: DC | PRN

## 2020-09-05 MED ORDER — HYDRALAZINE HCL 20 MG/ML IJ SOLN: 10.0000 mg | INTRAMUSCULAR | Status: DC | PRN

## 2020-09-05 MED ORDER — SODIUM CHLORIDE 0.9 % WEIGHT BASED INFUSION: 1.0000 mL/kg/h | INTRAVENOUS | Status: DC

## 2020-09-05 MED ORDER — IOHEXOL 350 MG/ML SOLN
INTRAVENOUS | Status: DC | PRN
  Administered 2020-09-05: 30 mL

## 2020-09-05 MED ORDER — HEPARIN SODIUM (PORCINE) 1000 UNIT/ML IJ SOLN
INTRAMUSCULAR | Status: DC | PRN
  Administered 2020-09-05: 4000 [IU] via INTRAVENOUS

## 2020-09-05 MED ORDER — VERAPAMIL HCL 2.5 MG/ML IV SOLN
INTRAVENOUS | Status: DC | PRN
  Administered 2020-09-05: 10 mL via INTRA_ARTERIAL

## 2020-09-05 MED ORDER — HEPARIN (PORCINE) IN NACL 1000-0.9 UT/500ML-% IV SOLN
INTRAVENOUS | Status: DC | PRN
  Administered 2020-09-05 (×2): 500 mL

## 2020-09-05 MED ORDER — SODIUM CHLORIDE 0.9 % IV SOLN: 250.0000 mL | INTRAVENOUS | Status: DC | PRN

## 2020-09-05 MED ORDER — ONDANSETRON HCL 4 MG/2ML IJ SOLN: 4.0000 mg | Freq: Four times a day (QID) | INTRAMUSCULAR | Status: DC | PRN

## 2020-09-05 MED ORDER — FENTANYL CITRATE (PF) 100 MCG/2ML IJ SOLN
INTRAMUSCULAR | Status: DC | PRN
  Administered 2020-09-05: 25 ug via INTRAVENOUS

## 2020-09-05 MED ORDER — LIDOCAINE HCL (PF) 1 % IJ SOLN
INTRAMUSCULAR | Status: AC
  Filled 2020-09-05: qty 30

## 2020-09-05 MED ORDER — LIDOCAINE HCL (PF) 1 % IJ SOLN
INTRAMUSCULAR | Status: DC | PRN
  Administered 2020-09-05 (×2): 2 mL

## 2020-09-05 MED ORDER — ACETAMINOPHEN 325 MG PO TABS: 650.0000 mg | ORAL_TABLET | ORAL | Status: DC | PRN

## 2020-09-05 MED ORDER — HEPARIN (PORCINE) IN NACL 1000-0.9 UT/500ML-% IV SOLN
INTRAVENOUS | Status: AC
  Filled 2020-09-05: qty 1000

## 2020-09-05 MED ORDER — SODIUM CHLORIDE 0.9% FLUSH: 3.0000 mL | INTRAVENOUS | Status: DC | PRN

## 2020-09-05 MED ORDER — HEPARIN SODIUM (PORCINE) 1000 UNIT/ML IJ SOLN
INTRAMUSCULAR | Status: AC
  Filled 2020-09-05: qty 1

## 2020-09-05 MED ORDER — MIDAZOLAM HCL 2 MG/2ML IJ SOLN
INTRAMUSCULAR | Status: DC | PRN
  Administered 2020-09-05: 1 mg via INTRAVENOUS

## 2020-09-05 MED ORDER — SODIUM CHLORIDE 0.9% FLUSH: 3.0000 mL | Freq: Two times a day (BID) | INTRAVENOUS | Status: DC

## 2020-09-05 MED ORDER — MIDAZOLAM HCL 2 MG/2ML IJ SOLN
INTRAMUSCULAR | Status: AC
  Filled 2020-09-05: qty 2

## 2020-09-05 MED ORDER — SODIUM CHLORIDE 0.9 % WEIGHT BASED INFUSION
3.0000 mL/kg/h | INTRAVENOUS | Status: AC
  Administered 2020-09-05: 3 mL/kg/h via INTRAVENOUS

## 2020-09-05 MED ORDER — VERAPAMIL HCL 2.5 MG/ML IV SOLN
INTRAVENOUS | Status: AC
  Filled 2020-09-05: qty 2

## 2020-09-05 MED ORDER — FENTANYL CITRATE (PF) 100 MCG/2ML IJ SOLN
INTRAMUSCULAR | Status: AC
  Filled 2020-09-05: qty 2

## 2020-09-05 MED ORDER — ASPIRIN 81 MG PO CHEW: 81.0000 mg | CHEWABLE_TABLET | ORAL | Status: DC

## 2020-09-05 SURGICAL SUPPLY — 11 items
CATH 5FR JL3.5 JR4 ANG PIG MP (CATHETERS) ×2 IMPLANT
CATH BALLN WEDGE 5F 110CM (CATHETERS) ×2 IMPLANT
DEVICE RAD TR BAND REGULAR (VASCULAR PRODUCTS) ×2 IMPLANT
GLIDESHEATH SLEND SS 6F .021 (SHEATH) ×2 IMPLANT
GUIDEWIRE INQWIRE 1.5J.035X260 (WIRE) ×1 IMPLANT
INQWIRE 1.5J .035X260CM (WIRE) ×2
KIT HEART LEFT (KITS) ×2 IMPLANT
PACK CARDIAC CATHETERIZATION (CUSTOM PROCEDURE TRAY) ×2 IMPLANT
SHEATH GLIDE SLENDER 4/5FR (SHEATH) ×2 IMPLANT
TRANSDUCER W/STOPCOCK (MISCELLANEOUS) ×2 IMPLANT
TUBING CIL FLEX 10 FLL-RA (TUBING) ×2 IMPLANT

## 2020-09-05 NOTE — Interval H&P Note (Signed)
History and Physical Interval Note:  09/05/2020 8:47 AM  Grace Thomas  has presented today for surgery, with the diagnosis of aortic stenosis, TAVR workup.  The various methods of treatment have been discussed with the patient and family. After consideration of risks, benefits and other options for treatment, the patient has consented to  Procedure(s): RIGHT/LEFT HEART CATH AND CORONARY ANGIOGRAPHY (N/A) as a surgical intervention.  The patient's history has been reviewed, patient examined, no change in status, stable for surgery.  I have reviewed the patient's chart and labs.  Questions were answered to the patient's satisfaction.     Sherren Mocha

## 2020-09-05 NOTE — Research (Signed)
IDENTIFY Informed Consent   Subject Name: MUSLIMA TOPPINS  Subject met inclusion and exclusion criteria.  The informed consent form, study requirements and expectations were reviewed with the subject and questions and concerns were addressed prior to the signing of the consent form.  The subject verbalized understanding of the trail requirements.  The subject agreed to participate in the identify trial and signed the informed consent.  The informed consent was obtained prior to performance of any protocol-specific procedures for the subject.  A copy of the signed informed consent was given to the subject and a copy was placed in the subject's medical record.  Philemon Kingdom D 09/05/2020, [phone]

## 2020-09-06 ENCOUNTER — Other Ambulatory Visit: Payer: Self-pay

## 2020-09-06 MED ORDER — METOPROLOL TARTRATE 50 MG PO TABS: ORAL_TABLET | ORAL | 0 refills | Status: DC

## 2020-09-19 ENCOUNTER — Other Ambulatory Visit: Payer: Self-pay

## 2020-09-19 ENCOUNTER — Ambulatory Visit (HOSPITAL_COMMUNITY)
Admission: RE | Admit: 2020-09-19 | Discharge: 2020-09-19 | Disposition: A | Discharge status: Home / Self Care | Payer: Medicare Other | Source: Ambulatory Visit | Attending: Cardiovascular Disease | Admitting: Cardiovascular Disease

## 2020-09-19 ENCOUNTER — Ambulatory Visit (HOSPITAL_COMMUNITY)
Admit: 2020-09-19 | Discharge: 2020-09-19 | Disposition: A | Discharge status: Home / Self Care | Payer: Medicare Other | Attending: Cardiovascular Disease | Admitting: Cardiovascular Disease

## 2020-09-19 DIAGNOSIS — M5136 Other intervertebral disc degeneration, lumbar region: Secondary | ICD-10-CM | POA: Diagnosis not present

## 2020-09-19 DIAGNOSIS — I350 Nonrheumatic aortic (valve) stenosis: Secondary | ICD-10-CM

## 2020-09-19 DIAGNOSIS — K769 Liver disease, unspecified: Secondary | ICD-10-CM | POA: Diagnosis not present

## 2020-09-19 DIAGNOSIS — M47814 Spondylosis without myelopathy or radiculopathy, thoracic region: Secondary | ICD-10-CM | POA: Diagnosis not present

## 2020-09-19 DIAGNOSIS — I771 Stricture of artery: Secondary | ICD-10-CM | POA: Diagnosis not present

## 2020-09-19 MED ORDER — IOHEXOL 350 MG/ML SOLN
95.0000 mL | Freq: Once | INTRAVENOUS | Status: AC | PRN
  Administered 2020-09-19: 95 mL via INTRAVENOUS

## 2020-09-19 NOTE — Progress Notes (Signed)
Carotid artery duplex completed. Refer to "CV Proc" under chart review to view preliminary results.  09/19/2020 9:23 AM Kelby Aline., MHA, RVT, RDCS, RDMS

## 2020-09-20 DIAGNOSIS — M47812 Spondylosis without myelopathy or radiculopathy, cervical region: Secondary | ICD-10-CM | POA: Diagnosis not present

## 2020-09-22 DIAGNOSIS — M47812 Spondylosis without myelopathy or radiculopathy, cervical region: Secondary | ICD-10-CM | POA: Diagnosis not present

## 2020-09-27 DIAGNOSIS — M47812 Spondylosis without myelopathy or radiculopathy, cervical region: Secondary | ICD-10-CM | POA: Diagnosis not present

## 2020-09-29 DIAGNOSIS — M47812 Spondylosis without myelopathy or radiculopathy, cervical region: Secondary | ICD-10-CM | POA: Diagnosis not present

## 2020-10-04 DIAGNOSIS — M47812 Spondylosis without myelopathy or radiculopathy, cervical region: Secondary | ICD-10-CM | POA: Diagnosis not present

## 2020-10-06 DIAGNOSIS — M47812 Spondylosis without myelopathy or radiculopathy, cervical region: Secondary | ICD-10-CM | POA: Diagnosis not present

## 2020-10-12 ENCOUNTER — Ambulatory Visit: Payer: Medicare Other | Attending: Cardiovascular Disease | Admitting: Physical Therapy

## 2020-10-12 ENCOUNTER — Encounter: Payer: Self-pay | Admitting: Surgery

## 2020-10-12 ENCOUNTER — Encounter: Payer: Self-pay | Admitting: Physical Therapy

## 2020-10-12 ENCOUNTER — Institutional Professional Consult (permissible substitution) (INDEPENDENT_AMBULATORY_CARE_PROVIDER_SITE_OTHER): Payer: Medicare Other | Admitting: Surgery

## 2020-10-12 ENCOUNTER — Other Ambulatory Visit: Payer: Self-pay

## 2020-10-12 VITALS — BP 127/76 | HR 71 | Resp 20 | Ht 63.0 in | Wt 170.0 lb

## 2020-10-12 DIAGNOSIS — I350 Nonrheumatic aortic (valve) stenosis: Secondary | ICD-10-CM

## 2020-10-12 DIAGNOSIS — R2689 Other abnormalities of gait and mobility: Secondary | ICD-10-CM | POA: Diagnosis not present

## 2020-10-12 NOTE — Therapy (Signed)
[place], [place], [address] Phone: [phone]   Fax:  [phone]  Physical Therapy Evaluation  Patient Details  Name: Grace Thomas MRN: [id number] Date of Birth: 26-Jun-1950 Referring Provider (PT): Sherren Mocha MD   Encounter Date: 10/12/2020   PT End of Session - 10/12/20 0959     Visit Number 1    Number of Visits 1    Date for PT Re-Evaluation 10/12/20    Authorization Type MCR    PT Start Time 2297    PT Stop Time 9892    PT Time Calculation (min) 35 min    Activity Tolerance Patient tolerated treatment well    Behavior During Therapy [facility] for tasks assessed/performed             Past Medical History:  Diagnosis Date   Anxiety    Arthritis    COVID-26 July 2020   GERD (gastroesophageal reflux disease)    Recurrent UTI     Past Surgical History:  Procedure Laterality Date   ABDOMINAL HYSTERECTOMY  1994   TAH BSO   CARPAL TUNNEL RELEASE Bilateral    RIGHT/LEFT HEART CATH AND CORONARY ANGIOGRAPHY N/A 09/05/2020   Procedure: RIGHT/LEFT HEART CATH AND CORONARY ANGIOGRAPHY;  Surgeon: Sherren Mocha, MD;  Location: [facility] [facility];  Service: Cardiovascular;  Laterality: N/A;   ROTATOR CUFF REPAIR Right    TUBAL LIGATION      There were no vitals filed for this visit.    Subjective Assessment - 10/12/20 0923     Subjective Patient reports she has a heart murmur. She experiences some shortness of breath with activity and has some tightness in her chest. States that sometimes at night she will also feel hot, then she wil cool off pretty quickly once she gets up.    Pertinent History States pinched nerve in neck with occasional right shoulder/arm pain, left knee arthritis    Limitations Walking;House hold activities    How long can you sit comfortably? No limitation    How long can you stand comfortably? No limitation    How long can you walk comfortably? No limitation     Patient Stated Goals Get heart better    Currently in Pain? No/denies                [facility] PT Assessment - 10/12/20 0001       Assessment   Medical Diagnosis Severe aortic stenosis    Referring Provider (PT) Sherren Mocha MD    Onset Date/Surgical Date --   3-4 months   Hand Dominance Right    Prior Therapy No      Precautions   Precautions None      Restrictions   Weight Bearing Restrictions No      Balance Screen   Has the patient fallen in the past 6 months No    Has the patient had a decrease in activity level because of a fear of falling?  No    Is the patient reluctant to leave their home because of a fear of falling?  No      Home Environment   Living Environment Private residence    Living Arrangements Spouse/significant other    Type of [place] to enter    Entrance Stairs-Number of Steps 2    Entrance Stairs-Rails None    Home Layout One level      Prior  Function   Level of Independence Independent    Vocation Retired    Leisure Work at [employer], yard work, helping elder sister      Cognition   Overall Cognitive Status Within Functional Limits for tasks assessed      Observation/Other Assessments   Observations Patient appears in no apparent distress    Focus on Therapeutic Outcomes (FOTO)  NA      Sensation   Light Touch Appears Intact      Coordination   Gross Motor Movements are Fluid and Coordinated Yes      Posture/Postural Control   Posture Comments Rounded shoulder posture      ROM / Strength   AROM / PROM / Strength AROM;Strength      AROM   Overall AROM Comments Patient exhibits BUE and BLE AROM grossly WFL, slight limitation and pain on right with overhead reach and reaching behind back      Strength   Overall Strength Comments Patient exhibits BUE and BLE strength grossly 4/5 MMT    Strength Assessment Site Hand    Right/Left hand Right;Left    Right Hand Grip (lbs) 30, 25, 22    Left Hand Grip  (lbs) 20,18, 20      Palpation   Palpation comment Non-TTP      Transfers   Transfers Independent with all Transfers      Ambulation/Gait   Ambulation/Gait Yes    Ambulation/Gait Assistance 7: Independent    Gait Comments Slightly antalgic on left with knee valgus, trendelenburg              [facility] Pre-Surgical Assessment - 10/12/20 0001     5 Meter Walk Test- trial 1 4 sec    5 Meter Walk Test- trial 2 4 sec.     5 Meter Walk Test- trial 3 4 sec.    5 meter walk test average 4 sec    4 Stage Balance Test tolerated for:  3 sec.    4 Stage Balance Test Position 4    Sit To Stand Test- trial 1 16 sec.    ADL/IADL Independent with: Bathing;Dressing;Meal prep;Finances;Yard work    ADL/IADL Therapist, sports Index Vulnerable    6 Minute Walk- Baseline yes    BP (mmHg) 119/55    HR (bpm) 76    02 Sat (%RA) 97 %    Modified Borg Scale for Dyspnea 0- Nothing at all    Perceived Rate of Exertion (Borg) 6-    6 Minute Walk Post Test yes    BP (mmHg) 139/69    HR (bpm) 85    02 Sat (%RA) 95 %    Modified Borg Scale for Dyspnea 3- Moderate shortness of breath or breathing difficulty    Perceived Rate of Exertion (Borg) 12-    Aerobic Endurance Distance Walked 1295                      Objective measurements completed on examination: See above findings.                PT Education - 10/12/20 0959     Education Details Exam findings    Person(s) Educated Patient    Methods Explanation    Comprehension Verbalized understanding                         Plan - 10/12/20 1000     Clinical Impression Statement See below:  Personal Factors and Comorbidities Age;Fitness;Past/Current Experience;Social Background    Examination-Activity Limitations Locomotion Level    Examination-Participation Restrictions Cleaning;Yard Work    Stability/Clinical Decision Making Stable/Uncomplicated    Clinical Decision Making Low    Rehab Potential Good    PT  Frequency One time visit    PT Treatment/Interventions ADLs/Self Care Home Management;Neuromuscular re-education;Balance training;Therapeutic exercise;Therapeutic activities;Functional mobility training;Stair training;Gait training;Patient/family education;Energy conservation    PT Next Visit Plan NA    PT Home Exercise Plan NA    Consulted and Agree with Plan of Care Patient             Clinical Impression Statement: Pt is a 70 yo female presenting to OP PT for evaluation prior to possible TAVR surgery due to severe aortic stenosis. Pt reports onset of shortness of breath and chest tightness approximately 3-4 months ago. Symptoms are limiting walking and household tasks. Pt presents with moderate ROM and strength, moderate balance and is fall risk 4 stage balance test, good walking speed and moderate aerobic endurance per 6 minute walk test. Pt ambulated a total of 1295 feet in 6 minute walk. Shortness of breath and RPE increased with 6 minute walk test. Based on the Short Physical Performance Battery, patient has a frailty rating of 10/12 with </= 5/12 considered frail.   Patient demonstrates the following deficits and impairments:  Abnormal gait, Difficulty walking, Cardiopulmonary status limiting activity, Postural dysfunction, Decreased strength, Pain  Visit Diagnosis: Other abnormalities of gait and mobility     Problem List Patient Active Problem List   Diagnosis Date Noted   Severe aortic stenosis 09/05/2020    Hilda Blades, PT, DPT, LAT, ATC 10/12/20  10:08 AM Phone: [phone] Fax: [place] [facility] [address] [place], [place], [address] Phone: [phone]   Fax:  [phone]  Name: Grace Thomas MRN: [id number] Date of Birth: June 13, 1950

## 2020-10-12 NOTE — Progress Notes (Signed)
Patient ID: Grace Thomas, female   DOB: 1950/03/06, 70 y.o.   MRN: [id number]  [facility]         [address]       [place] [address]             [phone]          CARDIOTHORACIC SURGERY CONSULTATION REPORT  PCP is Neale Burly, MD Referring Provider is Grace Mocha, MD Primary Cardiologist is Grace Lesches, MD  Reason for consultation: Severe aortic stenosis  HPI:  The patient is a 70 year old woman with no prior history of heart disease who was told that she had a heart murmur earlier this year during a gynecology evaluation.  She was referred back to her PCP and had an echocardiogram showing severe aortic stenosis.  The mean gradient was 38 mmHg with a dimensionless index of 0.19.  Left ventricular ejection fraction was greater than 55% with mild mitral regurgitation.  She was referred to Dr. Domenic Thomas and then subsequently Dr. Burt Thomas for evaluation for aortic valve replacement.  She reports some heaviness in her chest as well as shortness of breath with moderate levels of activity.  She has had fatigue and tires more easily than in the past.  She has had some dizziness particularly when looking up high.  She had an episode of syncope several months ago when she had COVID-19 but is fully recovered.  She had a follow-up echocardiogram on 08/25/2020 showing a trileaflet aortic valve with severe calcification and thickening.  The mean gradient was 46 mmHg with a valve area of 0.79 cm consistent with severe aortic stenosis.  There is mild mitral valve stenosis with a mean gradient of 4.5 mmHg and a peak gradient of 12.2 mmHg.  The valve appeared rheumatic with calcification of the anterior and posterior leaflets and mild MR.  Cardiac catheterization showed normal coronary arteries with a peak to peak gradient 29 mmHg and a mean gradient of 22 mmHg.  Right heart pressures were normal.  She is married and  lives with her husband.  She has 2 daughters who both live locally.  She receives regular dental visits and has had no problems with her teeth.  Past Medical History:  Diagnosis Date   Anxiety    Arthritis    COVID-26 July 2020   GERD (gastroesophageal reflux disease)    Recurrent UTI     Past Surgical History:  Procedure Laterality Date   ABDOMINAL HYSTERECTOMY  1994   TAH BSO   CARPAL TUNNEL RELEASE Bilateral    RIGHT/LEFT HEART CATH AND CORONARY ANGIOGRAPHY N/A 09/05/2020   Procedure: RIGHT/LEFT HEART CATH AND CORONARY ANGIOGRAPHY;  Surgeon: Grace Mocha, MD;  Location: [facility];  Service: Cardiovascular;  Laterality: N/A;   ROTATOR CUFF REPAIR Right    TUBAL LIGATION      Family History  Problem Relation Age of Onset   Heart failure Mother    Stroke Father    Bone cancer Sister    Pancreatic cancer Sister    Colitis Daughter     Social History   Socioeconomic History   Marital status: Married    Spouse name: Not on file   Number of children: Not on file   Years of education: Not on file   Highest education level: Not on file  Occupational History   Not on file  Tobacco Use   Smoking status: Never  Smokeless tobacco: Never  Vaping Use   Vaping Use: Never used  Substance and Sexual Activity   Alcohol use: No   Drug use: No   Sexual activity: Not on file  Other Topics Concern   Not on file  Social History Narrative   Not on file   Social Determinants of Health   Financial Resource Strain: Not on file  Food Insecurity: Not on file  Transportation Needs: Not on file  Physical Activity: Not on file  Stress: Not on file  Social Connections: Not on file  Intimate Partner Violence: Not on file    Prior to Admission medications   Medication Sig Start Date End Date Taking? Authorizing Provider  ALPRAZolam (XANAX) 0.25 MG tablet Take 0.125 mg by mouth daily as needed for anxiety (anxiety/nerves).   Yes [provider]  Ascorbic  Acid (VITAMIN C PO) Take 2 tablets by mouth every evening.   Yes [provider]  aspirin EC 81 MG tablet Take 81 mg by mouth once. Swallow whole. Pre cath   Yes [provider]  celecoxib (CELEBREX) 100 MG capsule Take 100 mg by mouth daily as needed (pain.).   Yes [provider]  Cholecalciferol (VITAMIN D3) 50 MCG (2000 UT) TABS Take 2,000 Units by mouth every evening.   Yes [provider]  fexofenadine (ALLEGRA) 180 MG tablet Take 180 mg by mouth daily as needed for allergies or rhinitis.   Yes [provider]  metoprolol tartrate (LOPRESSOR) 50 MG tablet Take 1 tablet by mouth as directed prior to 9/12 CT scan 09/06/20  Yes Grace Mocha, MD  Misc Natural Products (YEAST BALANCE ICS) CAPS Take 2 capsules by mouth every evening.   Yes [provider]  traMADol (ULTRAM) 50 MG tablet Take 50 mg by mouth daily as needed (pain.).   Yes [provider]  triamcinolone (NASACORT) 55 MCG/ACT AERO nasal inhaler Place 2 sprays into the nose daily as needed (allergies.).   Yes [provider]  Zinc 50 MG TABS Take 50 mg by mouth every evening.   Yes [provider]    Current Outpatient Medications  Medication Sig Dispense Refill   ALPRAZolam (XANAX) 0.25 MG tablet Take 0.125 mg by mouth daily as needed for anxiety (anxiety/nerves).     Ascorbic Acid (VITAMIN C PO) Take 2 tablets by mouth every evening.     aspirin EC 81 MG tablet Take 81 mg by mouth once. Swallow whole. Pre cath     celecoxib (CELEBREX) 100 MG capsule Take 100 mg by mouth daily as needed (pain.).     Cholecalciferol (VITAMIN D3) 50 MCG (2000 UT) TABS Take 2,000 Units by mouth every evening.     fexofenadine (ALLEGRA) 180 MG tablet Take 180 mg by mouth daily as needed for allergies or rhinitis.     metoprolol tartrate (LOPRESSOR) 50 MG tablet Take 1 tablet by mouth as directed prior to 9/12 CT scan 1 tablet 0   Misc Natural Products (YEAST BALANCE ICS)  CAPS Take 2 capsules by mouth every evening.     traMADol (ULTRAM) 50 MG tablet Take 50 mg by mouth daily as needed (pain.).     triamcinolone (NASACORT) 55 MCG/ACT AERO nasal inhaler Place 2 sprays into the nose daily as needed (allergies.).     Zinc 50 MG TABS Take 50 mg by mouth every evening.     Current Facility-Administered Medications  Medication Dose Route Frequency Provider Last Rate Last Admin   0.9 %  sodium chloride infusion  500 mL Intravenous Once Grace Meuse III, MD        No Known Allergies    Review of Systems:   General:  no appetite, + decreased energy, no weight gain, no weight loss, no fever  Cardiac:  + chest pain with exertion, no chest pain at rest, +SOB with moderate exertion, no resting SOB, no PND, + orthopnea, no palpitations, no arrhythmia, no atrial fibrillation, no LE edema, + dizzy spells, + syncopeno  Respiratory:  + exertional shortness of breath, no home oxygen, no productive cough, no dry cough, no bronchitis, no wheezing, no hemoptysis, no asthma, no pain with inspiration or cough, no sleep apnea, no CPAP at night  GI:   no difficulty swallowing, no reflux, + frequent heartburn, no hiatal hernia, + abdominal pain, no constipation, no diarrhea, no hematochezia, no hematemesis, no melena  GU:   no dysuria,  no frequency, no urinary tract infection, no hematuria, no kidney stones, no kidney disease  Vascular:  no pain suggestive of claudication, no pain in feet, no leg cramps, no varicose veins, no DVT, no non-healing foot ulcer  Neuro:   no stroke, no TIA's, no seizures, no headaches, no temporary blindness one eye,  no slurred speech, + peripheral neuropathy in hands and arm from pinched nerve in neck, no chronic pain, no instability of gait, no memory/cognitive dysfunction  Musculoskeletal: + arthritis, no joint swelling, + myalgias, no difficulty walking, normal mobility   Skin:   no rash, no itching, no skin infections, no pressure sores or  ulcerations  Psych:   + anxiety, no depression, no nervousness, no unusual recent stress  Eyes:   no blurry vision, no floaters, no recent vision changes, + wears glasses   ENT:   no hearing loss, no loose or painful teeth, + partial dentures, last saw dentist 3 years ago  Hematologic:  + easy bruising, no abnormal bleeding, no clotting disorder, no frequent epistaxis  Endocrine:  no diabetes, does not check CBG's at home     Physical Exam:   BP 127/76   Pulse 71   Resp 20   Ht 5\' 3"  (1.6 m)   Wt 170 lb (77.1 kg)   SpO2 93% Comment: RA  BMI 30.11 kg/m   General:  Elderly but  well-appearing  HEENT:  Unremarkable, NCAT, PERLA, EOMI  Neck:   no JVD, no bruits, no adenopathy   Chest:   clear to auscultation, symmetrical breath sounds, no wheezes, no rhonchi   CV:   RRR, 3/6 systolic murmur RSB, no diastolic murmur  Abdomen:  soft, non-tender, no masses   Extremities:  warm, well-perfused, pulses palpable at ankle, no lower extremity edema  Rectal/GU  Deferred  Neuro:   Grossly non-focal and symmetrical throughout  Skin:   Clean and dry, no rashes, no breakdown  Diagnostic Tests:     ECHOCARDIOGRAM REPORT         Patient Name:   Grace Thomas Date of Exam: 08/25/2020  Medical Rec #:  [id number]        Height:       63.0 in  Accession #:    [id number]       Weight:       165.2 lb  Date of Birth:  11-16-1950         BSA:          1.783 m  Patient Age:    55 years  BP:           122/64 mmHg  Patient Gender: F                HR:           88 bpm.  Exam Location:  [address]   Procedure: 2D Echo, 3D Echo, Cardiac Doppler, Color Doppler and Strain  Analysis   Indications:    I35 Aortic stenosis     History:        Patient has prior history of Echocardiogram examinations,  most                  recent 07/01/2020. Aortic Valve Disease;  Signs/Symptoms:Murmur.                  COVID-19 06/2020.     Sonographer:    Grace Thomas BS, RDCS  Referring Phys: Emm[place]     1. The aortic valve is tricuspid. There is severe calcifcation of the  aortic valve. There is severe thickening of the aortic valve. Aortic valve  regurgitation is mild. Severe aortic valve stenosis. Aortic valve area, by  VTI measures 0.79 cm. Aortic  valve mean gradient measures 46.0 mmHg. Aortic valve Vmax measures 4.20  m/s.   2. The mitral valve appears rheumatic with mild mitral stenosis (MG 4.5  mmHG @ 82 bpm). MVA by PHT 2.3 cm2. The mitral valve is rheumatic. Mild  mitral valve regurgitation. Mild mitral stenosis. The mean mitral valve  gradient is 4.5 mmHg with average  heart rate of 82 bpm.   3. Left ventricular ejection fraction, by estimation, is 55 to 60%. Left  ventricular ejection fraction by 3D volume is 59 %. The left ventricle has  normal function. The left ventricle has no regional wall motion  abnormalities. Left ventricular diastolic   parameters are consistent with Grade I diastolic dysfunction (impaired  relaxation). The average left ventricular global longitudinal strain is  -20.6 %. The global longitudinal strain is normal.   4. Right ventricular systolic function is normal. The right ventricular  size is normal. Tricuspid regurgitation signal is inadequate for assessing  PA pressure.   5. The inferior vena cava is normal in size with greater than 50%  respiratory variability, suggesting right atrial pressure of 3 mmHg.   Comparison(s): Report only 07/01/20 EF >55%.   FINDINGS   Left Ventricle: Left ventricular ejection fraction, by estimation, is 55  to 60%. Left ventricular ejection fraction by 3D volume is 59 %. The left  ventricle has normal function. The left ventricle has no regional wall  motion abnormalities. The average  left ventricular global longitudinal strain is -20.6 %. The global  longitudinal strain is normal. The left ventricular internal cavity size  was normal in size. There is no left ventricular  hypertrophy. Left  ventricular diastolic parameters are consistent   with Grade I diastolic dysfunction (impaired relaxation).   Right Ventricle: The right ventricular size is normal. No increase in  right ventricular wall thickness. Right ventricular systolic function is  normal. Tricuspid regurgitation signal is inadequate for assessing PA  pressure.   Left Atrium: Left atrial size was normal in size.   Right Atrium: Right atrial size was normal in size.   Pericardium: Trivial pericardial effusion is present.   Mitral Valve: The mitral valve appears rheumatic with mild mitral stenosis  (MG 4.5 mmHG @ 82 bpm). MVA by PHT 2.3 cm2. The mitral valve is  rheumatic.  There is mild calcification of the anterior and posterior mitral valve  leaflet(s). Mild mitral valve  regurgitation. Mild mitral valve stenosis. MV peak gradient, 12.2 mmHg.  The mean mitral valve gradient is 4.5 mmHg with average heart rate of 82  bpm.   Tricuspid Valve: The tricuspid valve is grossly normal. Tricuspid valve  regurgitation is trivial. No evidence of tricuspid stenosis.   Aortic Valve: The aortic valve is tricuspid. There is severe calcifcation  of the aortic valve. There is severe thickening of the aortic valve.  Aortic valve regurgitation is mild. Aortic regurgitation PHT measures 461  msec. Severe aortic stenosis is  present. Aortic valve mean gradient measures 46.0 mmHg. Aortic valve peak  gradient measures 70.6 mmHg. Aortic valve area, by VTI measures 0.79 cm.   Pulmonic Valve: The pulmonic valve was grossly normal. Pulmonic valve  regurgitation is trivial. No evidence of pulmonic stenosis.   Aorta: The aortic root and ascending aorta are structurally normal, with  no evidence of dilitation.   Venous: The inferior vena cava is normal in size with greater than 50%  respiratory variability, suggesting right atrial pressure of 3 mmHg.   IAS/Shunts: The atrial septum is grossly normal.       LEFT VENTRICLE  PLAX 2D  LVIDd:         4.70 cm  LVIDs:         3.10 cm         2D  LV PW:         0.80 cm         Longitudinal  LV IVS:        0.90 cm         Strain  LVOT diam:     2.20 cm         2D Strain GLS  -21.9 %  LV SV:         85              (A2C):  LV SV Index:   48              2D Strain GLS  -22.4 %  LVOT Area:     3.80 cm        (A3C):                                 2D Strain GLS  -17.4 %                                 (A4C):                                 2D Strain GLS  -20.6 %                                 Avg:                                    3D Volume EF                                 LV  3D EF:    Left                                              ventricul                                              ar                                              ejection                                              fraction                                              by 3D                                              volume is                                              59 %.                                    3D Volume EF:                                 3D EF:        59 %                                 LV EDV:       100 ml                                 LV ESV:       41 ml                                 LV SV:        59 ml   RIGHT VENTRICLE  RV Basal diam:  3.40 cm  RV S prime:     11.70 cm/s  TAPSE (M-mode): 2.1 cm   LEFT ATRIUM             Index       RIGHT ATRIUM  Index  LA diam:        3.20 cm 1.79 cm/m  RA Pressure: 3.00 mmHg  LA Vol (A2C):   46.5 ml 26.08 ml/m RA Area:     10.40 cm  LA Vol (A4C):   30.6 ml 17.16 ml/m RA Volume:   21.90 ml  12.28 ml/m  LA Biplane Vol: 39.5 ml 22.16 ml/m   AORTIC VALVE  AV Area (Vmax):    0.88 cm  AV Area (Vmean):   0.78 cm  AV Area (VTI):     0.79 cm  AV Vmax:           420.00 cm/s  AV Vmean:          302.200 cm/s  AV VTI:            1.080 m  AV Peak Grad:      70.6 mmHg  AV Mean Grad:       46.0 mmHg  LVOT Vmax:         97.50 cm/s  LVOT Vmean:        61.800 cm/s  LVOT VTI:          0.224 m  LVOT/AV VTI ratio: 0.21  AI PHT:            461 msec     AORTA  Ao Root diam: 3.30 cm  Ao Asc diam:  3.30 cm   MITRAL VALVE                TRICUSPID VALVE  MV Area (PHT): 2.43 cm     Estimated RAP:  3.00 mmHg  MV Area VTI:   2.39 cm  MV Peak grad:  12.2 mmHg    SHUNTS  MV Mean grad:  4.5 mmHg     Systemic VTI:  0.22 m  MV Vmax:       1.75 m/s     Systemic Diam: 2.20 cm  MV Vmean:      102.0 cm/s  MV Decel Time: 312 msec  MV E velocity: 102.00 cm/s  MV A velocity: 150.00 cm/s  MV E/A ratio:  0.68   Grace Chiquito MD  Electronically signed by Grace Chiquito MD  Signature Date/Time: 08/25/2020/7:21:00 PM         Final     Physicians  Panel Physicians Referring Physician Case Authorizing Physician  Grace Mocha, MD (Primary)     Procedures  RIGHT/LEFT HEART CATH AND CORONARY ANGIOGRAPHY   Conclusion  1.  Angiographically normal coronary arteries. 2.  Calcification of the aortic valve with restricted leaflet opening, hemodynamic findings consistent with moderately severe aortic stenosis with peak to peak gradient 29 mmHg, mean gradient 22 mmHg, calculated valve area 1.1 cm 3.  Normal right heart pressures   Recommend: Valve team discussion.  Echo parameters suggestive of severe aortic stenosis in the symptomatic patient.  Favor moving forward with TAVR evaluation.   Procedural Details  Technical Details INDICATION: Severe aortic stenosis  PROCEDURAL DETAILS: There was an indwelling IV in a right antecubital vein. Using normal sterile technique, the IV was changed out for a 5 Fr brachial sheath over a 0.018 inch wire. The right wrist was then prepped, draped, and anesthetized with 1% lidocaine. Using the modified Seldinger technique a 5/6 French Slender sheath was placed in the right radial artery. Intra-arterial verapamil was administered through the radial artery  sheath. IV heparin was administered after a JR4 catheter was advanced into the central aorta. A Swan-Ganz catheter was used  for the right heart catheterization. Standard protocol was followed for recording of right heart pressures and sampling of oxygen saturations. Fick cardiac output was calculated. Standard Judkins catheters were used for selective coronary angiography. LV pressure is recorded and an aortic valve pullback is performed. The aortic valve is crossed with a JR4 catheter and J wire. There were no immediate procedural complications. The patient was transferred to the post catheterization recovery area for further monitoring.      Estimated blood loss <50 mL.   During this procedure medications were administered to achieve and maintain moderate conscious sedation while the patient's heart rate, blood pressure, and oxygen saturation were continuously monitored and I was present face-to-face 100% of this time.   Medications (Filter: Administrations occurring from 1008 to 1053 on 09/05/20)  important  Continuous medications are totaled by the amount administered until 09/05/20 1053.   midazolam (VERSED) injection (mg) Total dose:  1 mg Date/Time Rate/Dose/Volume Action   09/05/20 1022 1 mg Given    fentaNYL (SUBLIMAZE) injection (mcg) Total dose:  25 mcg Date/Time Rate/Dose/Volume Action   09/05/20 1022 25 mcg Given    lidocaine (PF) (XYLOCAINE) 1 % injection (mL) Total volume:  4 mL Date/Time Rate/Dose/Volume Action   09/05/20 1028 2 mL Given   1029 2 mL Given    Heparin (Porcine) in NaCl 1000-0.9 UT/500ML-% SOLN (mL) Total volume:  1,000 mL Date/Time Rate/Dose/Volume Action   09/05/20 1029 500 mL Given   1029 500 mL Given    Radial Cocktail/Verapamil only (mL) Total volume:  10 mL Date/Time Rate/Dose/Volume Action   09/05/20 1038 10 mL Given    heparin sodium (porcine) injection (Units) Total dose:  4,000 Units Date/Time Rate/Dose/Volume Action   09/05/20 1040  4,000 Units Given    iohexol (OMNIPAQUE) 350 MG/ML injection (mL) Total volume:  30 mL Date/Time Rate/Dose/Volume Action   09/05/20 1049 30 mL Given    Sedation Time  Sedation Time Physician-1: 24 minutes 48 seconds Contrast  Medication Name Total Dose  iohexol (OMNIPAQUE) 350 MG/ML injection 30 mL   Radiation/Fluoro  Fluoro time: 2.6 (min) DAP: 6469 (mGycm2) Cumulative Air Kerma: 114 (mGy) Coronary Findings  Diagnostic Dominance: Right Left Main  Vessel is angiographically normal.  Left Anterior Descending  The LAD is angiographically normal. The midportion has some tortuosity. The vessel has no significant stenosis throughout and it reaches the LV apex.  Left Circumflex  The circumflex is angiographically normal with no significant stenosis.  Right Coronary Artery  Vessel was injected. Vessel is normal in caliber. Vessel is angiographically normal. There is a mild fold in the proximal RCA with no associated stenosis  Intervention  No interventions have been documented. Left Heart  Aortic Valve The aortic valve is calcified. There is restricted aortic valve motion.   Coronary Diagrams  Diagnostic Dominance: Right Intervention  Implants     No implant documentation for this case.   Syngo Images   Show images for CARDIAC CATHETERIZATION Images on Long Term Storage   Show images for Sharlette, Jansma to Procedure Log  Procedure Log    Hemo Data  Flowsheet Row Most Recent Value  Fick Cardiac Output 5.41 L/min  Fick Cardiac Output Index 3.03 (L/min)/BSA  Aortic Mean Gradient 22 mmHg  Aortic Peak Gradient 29 mmHg  Aortic Valve Area 1.13  Aortic Value Area Index 0.63 cm2/BSA  RA A Wave 3 mmHg  RA V Wave 1 mmHg  RA Mean 0 mmHg  RV Systolic Pressure 25 mmHg  RV Diastolic Pressure 0 mmHg  RV EDP 4 mmHg  PA Systolic Pressure 23 mmHg  PA Diastolic Pressure 6 mmHg  PA Mean 13 mmHg  PW A Wave 11 mmHg  PW V Wave 11 mmHg  PW Mean 5 mmHg  AO  Systolic Pressure 338 mmHg  AO Diastolic Pressure 61 mmHg  AO Mean 86 mmHg  LV Systolic Pressure 250 mmHg  LV Diastolic Pressure 4 mmHg  LV EDP 9 mmHg  AOp Systolic Pressure 539 mmHg  AOp Diastolic Pressure 59 mmHg  AOp Mean Pressure 86 mmHg  LVp Systolic Pressure 767 mmHg  LVp Diastolic Pressure 2 mmHg  LVp EDP Pressure 7 mmHg  QP/QS 1  TPVR Index 4.29 HRUI  TSVR Index 28.35 HRUI  TPVR/TSVR Ratio 0.15    ADDENDUM REPORT: 09/19/2020 18:05   CLINICAL DATA:  Severe Aortic Stenosis.   EXAM: Cardiac TAVR CT   TECHNIQUE: A non-contrast, gated CT scan was obtained with axial slices of 3 mm through the heart for aortic valve calcium scoring. A 110 kV retrospective, gated, contrast cardiac scan was obtained. Gantry rotation speed was 250 msecs and collimation was 0.6 mm. Nitroglycerin was not given. The 3D data set was reconstructed in 5% intervals of the 0-95% of the R-R cycle. Systolic and diastolic phases were analyzed on a dedicated workstation using MPR, MIP, and VRT modes. The patient received 100 cc of contrast.   FINDINGS: Image quality: Excellent.   Noise artifact is: Limited.   Valve Morphology: The aortic valve is tricuspid with severe calcifications and severe thickening. There is restricted movement in systole. The [place] and RCC are diffusely calcified.   Aortic Valve Calcium score: 819   Aortic annular dimension:   Phase assessed: 30%   Annular area: 454 mm2   Annular perimeter: 77.0 mm   Max diameter: 27.0 mm   Min diameter: 22.5 mm   Annular and subannular calcification: None.   Optimal coplanar projection: RAO 5 CAU 2   Coronary Artery Height above Annulus:   Left Main: 10.7 mm   Right Coronary: 12.9 mm   Sinus of Valsalva Measurements:   Non-coronary: 25 mm   Right-coronary: 26 mm   Left-coronary: 26 mm   Average diameter: 26.5 mm   Sinus of Valsalva Height:   Non-coronary: 21.3 mm   Right-coronary: 18.5 mm   Left-coronary:  16.7 mm   Sinotubular Junction: 26 mm   Ascending Thoracic Aorta: 30 mm   Coronary Arteries: Normal coronary origin. Right dominance. The study was performed without use of NTG and is insufficient for plaque evaluation. Please refer to recent cardiac catheterization for coronary assessment.   Cardiac Morphology:   Right Atrium: Right atrial size is within normal limits.   Right Ventricle: The right ventricular cavity is within normal limits.   Left Atrium: Left atrial size is normal in size with no left atrial appendage filling defect. A small PFO is present.   Left Ventricle: The ventricular cavity size is within normal limits. There are no stigmata of prior infarction. There is no abnormal filling defect. Normal left ventricular function, EF=64%. No regional wall motion abnormalities.   Pulmonary arteries: Normal in size without proximal filling defect.   Pulmonary veins: Normal pulmonary venous drainage.   Pericardium: Normal thickness with no significant effusion or calcium present.   Mitral Valve: The mitral valve appears rheumatic with mild calcification.   Extra-cardiac findings: See attached radiology report for non-cardiac structures.   IMPRESSION: 1. Tricuspid aortic valve with severe diffuse  calcifications.   2. Annular measurements appropriate for 26 mm S3 (454 mm2).   3. No significant annular or subannular calcifications.   4. Shallow left main height (10.7 mm).   5. Optimal Fluoroscopic Angle for Delivery: RAO 5 CAU 2   6. Small PFO.   [place] Bells T. Audie Box, MD     Electronically Signed   By: Grace Thomas M.D.   On: 09/19/2020 18:05  Narrative & Impression  CLINICAL DATA:  Aortic valve stenosis. TAVR pre-intervention planning.   EXAM: CT ANGIOGRAPHY CHEST, ABDOMEN AND PELVIS   TECHNIQUE: Multidetector CT imaging through the chest, abdomen and pelvis was performed using the standard protocol during bolus administration of intravenous  contrast. Multiplanar reconstructed images and MIPs were obtained and reviewed to evaluate the vascular anatomy.   CONTRAST:  9mL OMNIPAQUE IOHEXOL 350 MG/ML SOLN   COMPARISON:  07/15/2020 chest radiograph. 11/13/2016 CT abdomen/pelvis.   FINDINGS: CTA CHEST FINDINGS   Cardiovascular: Normal heart size. No significant pericardial effusion/thickening. Diffuse thickening and coarse calcification of the aortic valve. Great vessels are normal in course and caliber. No central pulmonary emboli.   Mediastinum/Nodes: No discrete thyroid nodules. Unremarkable esophagus. No pathologically enlarged axillary, mediastinal or hilar lymph nodes.   Lungs/Pleura: No pneumothorax. No pleural effusion. No acute consolidative airspace disease, lung masses or significant pulmonary nodules.   Musculoskeletal: No aggressive appearing focal osseous lesions. Soft tissue anchors in the right humeral head. Mild thoracic spondylosis.   CTA ABDOMEN AND PELVIS FINDINGS   Hepatobiliary: Normal liver size. Subcentimeter hypodense anterior right liver lesion is too small to characterize and unchanged, considered benign. No new liver lesions. Normal gallbladder with no radiopaque cholelithiasis. No biliary ductal dilatation.   Pancreas: Normal, with no mass or duct dilation.   Spleen: Normal size. No mass.   Adrenals/Urinary Tract: Normal adrenals. Exophytic homogeneous 3.0 cm hypodense renal cortical lesion in the posterior lower left kidney with density 26 HU (series 3/image 366), stable since 11/13/2016, compatible with a benign Bosniak category 2 renal cyst. Additional subcentimeter hypodense anterior lower right renal cortical lesion is too small to characterize and is unchanged, considered benign. No new contour deforming renal lesions. No hydronephrosis. Normal bladder.   Stomach/Bowel: Normal non-distended stomach. Normal caliber small bowel with no small bowel wall thickening. Normal  appendix. Normal large bowel with no diverticulosis, large bowel wall thickening or pericolonic fat stranding.   Vascular/Lymphatic: Mildly atherosclerotic nonaneurysmal abdominal aorta. No pathologically enlarged lymph nodes in the abdomen or pelvis.   Reproductive: Status post hysterectomy, with no abnormal findings at the vaginal cuff. No adnexal mass.   Other: No pneumoperitoneum, ascites or focal fluid collection.   Musculoskeletal: No aggressive appearing focal osseous lesions. Moderate lumbar degenerative disc disease, most prominent at L1-2.   VASCULAR MEASUREMENTS PERTINENT TO TAVR:   AORTA:   Minimal Aortic Diameter-15.1 x 13.1 mm   Severity of Aortic Calcification-mild   RIGHT PELVIS:   Right Common Iliac Artery -   Minimal Diameter-7.9 x 7.4 mm   Tortuosity-mild   Calcification-mild   Right External Iliac Artery -   Minimal Diameter-4.7 x 4.6 mm   Tortuosity-mild   Calcification-none   Right Common Femoral Artery -   Minimal Diameter-5.3 x 5.1 mm   Tortuosity-mild   Calcification-none   LEFT PELVIS:   Left Common Iliac Artery -   Minimal Diameter-7.9 x 7.7 mm   Tortuosity-mild   Calcification-none   Left External Iliac Artery -   Minimal Diameter-4.6 x 4.4 mm   Tortuosity-mild  Calcification-none   Left Common Femoral Artery -   Minimal Diameter-5.2 x 4.8 mm   Tortuosity-mild   Calcification-none   Review of the MIP images confirms the above findings.   IMPRESSION: 1. Vascular findings and measurements pertinent to potential TAVR procedure, as detailed. Note is made of somewhat diminutive external iliac and common femoral arteries bilaterally. 2. Diffuse thickening and coarse calcification of the aortic valve, compatible with the reported history of aortic stenosis. 3. Aortic Atherosclerosis (ICD10-I70.0).     Electronically Signed   By: Ilona Sorrel M.D.   On: 09/19/2020 13:22    STS RISK CALCULATOR: Isolated  AVR: Risk of Mortality: 1.543% Renal Failure: 0.711% Permanent Stroke: 1.097% Prolonged Ventilation: 4.669% DSW Infection: 0.060% Reoperation: 2.825% Morbidity or Mortality: 8.090% Short Length of Stay: 51.519% Long Length of Stay: 2.829%   Impression:  This 70 year old woman has stage D, severe, symptomatic aortic stenosis with New York Heart Association class II symptoms of exertional fatigue and shortness of breath as well as chest discomfort consistent with chronic diastolic congestive heart failure.  I have personally reviewed her 2D echocardiogram, cardiac catheterization, and CTA studies.  Her echocardiogram shows a severely calcified trileaflet aortic valve with severe leaflet thickening and restricted mobility.  The mean gradient is 46 mmHg with a valve area of 0.79 cm consistent with severe aortic stenosis.  There is mild aortic insufficiency.  The mitral valve appears rheumatic with mild calcification of the anterior and posterior leaflets with a mean gradient 4.5 mmHg and a peak gradient of 12 mmHg consistent with mild mitral valve stenosis.  There is mild mitral valve regurgitation.  Left ventricular ejection fraction was 55 to 60%.  I agree that aortic valve replacement is indicated in this patient for relief of her symptoms and to prevent progressive left ventricular deterioration.  She is a low risk patient but 70 years old with mild limitation due to osteoarthritis and I think transcatheter aortic valve replacement would be a reasonable alternative to open surgical aortic valve replacement.  Her gated cardiac CTA shows anatomy suitable for transcatheter aortic valve placement using a SAPIEN 3 valve.  Her abdominal and pelvic CTA shows marginal size of the common femoral and external iliac arteries bilaterally.  There is no significant disease in them so I think it may be possible to pass a 14 [place] sheath.  Her subclavian and carotid arteries were also small.  I think  transaortic or transapical insertion would be options if needed. The patient was counseled at length regarding treatment alternatives for management of severe symptomatic aortic stenosis. The risks and benefits of surgical intervention has been discussed in detail. Long-term prognosis with medical therapy was discussed. Alternative approaches such as conventional surgical aortic valve replacement, transcatheter aortic valve replacement, and palliative medical therapy were compared and contrasted at length. This discussion was placed in the context of the patient's own specific clinical presentation and past medical history. All of her questions have been addressed.   Following the decision to proceed with transcatheter aortic valve replacement, a discussion was held regarding what types of management strategies would be attempted intraoperatively in the event of life-threatening complications, including whether or not the patient would be considered a candidate for the use of cardiopulmonary bypass and/or conversion to open sternotomy for attempted surgical intervention.  She is a low surgical risk patient and I think she would be a candidate for emergent sternotomy to manage any intraoperative complications.  The patient is aware of the fact that transient  use of cardiopulmonary bypass may be necessary. The patient has been advised of a variety of complications that might develop including but not limited to risks of death, stroke, paravalvular leak, aortic dissection or other major vascular complications, aortic annulus rupture, device embolization, cardiac rupture or perforation, mitral regurgitation, acute myocardial infarction, arrhythmia, heart block or bradycardia requiring permanent pacemaker placement, congestive heart failure, respiratory failure, renal failure, pneumonia, infection, other late complications related to structural valve deterioration or migration, or other complications that might  ultimately cause a temporary or permanent loss of functional independence or other long term morbidity. The patient provides full informed consent for the procedure as described and all questions were answered.      Plan:  She will be scheduled for transfemoral transcatheter aortic valve replacement with backup being transaortic or transapical insertion.  This case will need to be done under general anesthesia in the operating room in case alternative access is required.   Gaye Pollack, MD 10/12/2020 2:26 PM

## 2020-10-13 DIAGNOSIS — F064 Anxiety disorder due to known physiological condition: Secondary | ICD-10-CM | POA: Diagnosis not present

## 2020-10-13 DIAGNOSIS — M5441 Lumbago with sciatica, right side: Secondary | ICD-10-CM | POA: Diagnosis not present

## 2020-10-13 DIAGNOSIS — I358 Other nonrheumatic aortic valve disorders: Secondary | ICD-10-CM | POA: Diagnosis not present

## 2020-10-13 DIAGNOSIS — M25561 Pain in right knee: Secondary | ICD-10-CM | POA: Diagnosis not present

## 2020-10-13 DIAGNOSIS — E7849 Other hyperlipidemia: Secondary | ICD-10-CM | POA: Diagnosis not present

## 2020-10-13 DIAGNOSIS — K219 Gastro-esophageal reflux disease without esophagitis: Secondary | ICD-10-CM | POA: Diagnosis not present

## 2020-10-24 ENCOUNTER — Other Ambulatory Visit: Payer: Self-pay

## 2020-10-24 DIAGNOSIS — I350 Nonrheumatic aortic (valve) stenosis: Secondary | ICD-10-CM

## 2020-10-25 DIAGNOSIS — M47812 Spondylosis without myelopathy or radiculopathy, cervical region: Secondary | ICD-10-CM | POA: Diagnosis not present

## 2020-10-27 DIAGNOSIS — M47812 Spondylosis without myelopathy or radiculopathy, cervical region: Secondary | ICD-10-CM | POA: Diagnosis not present

## 2020-10-27 NOTE — Progress Notes (Signed)
Surgical Instructions    Your procedure is scheduled on Tuesday, October 25th, 2022.   Report to [facility] Main Entrance "A" at 05:30 A.M., then check in with the Admitting office.  Call this number if you have problems the morning of surgery:  [phone]   If you have any questions prior to your surgery date call [phone]: Open Monday-Friday 8am-4pm    Remember:  Do not eat or drink after midnight the night before your surgery    STOP on Tuesday (10/18) taking Aleve, Naproxen, Ibuprofen, Motrin, Advil, Goody's, BC's, all herbal medications, fish oil, and all vitamins.   Continue taking all other medications without change through the day before surgery. On the morning of surgery do not take any medications.    After your COVID test   You are not required to quarantine however you are required to wear a well-fitting mask when you are out and around people not in your household.  If your mask becomes wet or soiled, replace with a new one.  Wash your hands often with soap and water for 20 seconds or clean your hands with an alcohol-based hand sanitizer that contains at least 60% alcohol.  Do not share personal items.  Notify your provider: if you are in close contact with someone who has COVID  or if you develop a fever of 100.4 or greater, sneezing, cough, sore throat, shortness of breath or body aches.    The day of surgery:          Do not wear jewelry or makeup Do not wear lotions, powders, perfumes, or deodorant. Do not shave 48 hours prior to surgery.   Do not bring valuables to the hospital. DO Not wear nail polish, gel polish, artificial nails, or any other type of covering on natural nails including finger and toenails. If patients have artificial nails, gel coating, etc. that need to be removed by a nail salon, please have this removed prior to surgery or surgery may need to be canceled/delayed if the surgeon/ anesthesia feels like the patient is unable to be  adequately monitored.              [facility] is not responsible for any belongings or valuables.  Do NOT Smoke (Tobacco/Vaping)  24 hours prior to your procedure  If you use a CPAP at night, you may bring your mask for your overnight stay.   Contacts, glasses, hearing aids, dentures or partials may not be worn into surgery, please bring cases for these belongings   For patients admitted to the hospital, discharge time will be determined by your treatment team.   Patients discharged the day of surgery will not be allowed to drive home, and someone needs to stay with them for 24 hours.  NO VISITORS WILL BE ALLOWED IN PRE-OP WHERE PATIENTS ARE PREPPED FOR SURGERY.  ONLY 1 SUPPORT PERSON MAY BE PRESENT IN THE WAITING ROOM WHILE YOU ARE IN SURGERY.  IF YOU ARE TO BE ADMITTED, ONCE YOU ARE IN YOUR ROOM YOU WILL BE ALLOWED TWO (2) VISITORS. 1 (ONE) VISITOR MAY STAY OVERNIGHT BUT MUST ARRIVE TO THE ROOM BY 8pm.  Minor children may have two parents present. Special consideration for safety and communication needs will be reviewed on a case by case basis.  Special instructions:    Oral Hygiene is also important to reduce your risk of infection.  Remember - BRUSH YOUR TEETH THE MORNING OF SURGERY WITH YOUR REGULAR TOOTHPASTE   [facility]- Preparing  For Surgery  Before surgery, you can play an important role. Because skin is not sterile, your skin needs to be as free of germs as possible. You can reduce the number of germs on your skin by washing with CHG (chlorahexidine gluconate) Soap before surgery.  CHG is an antiseptic cleaner which kills germs and bonds with the skin to continue killing germs even after washing.     Please do not use if you have an allergy to CHG or antibacterial soaps. If your skin becomes reddened/irritated stop using the CHG.  Do not shave (including legs and underarms) for at least 48 hours prior to first CHG shower. It is OK to shave your face.  Please follow these  instructions carefully.     Shower the NIGHT BEFORE SURGERY and the MORNING OF SURGERY with CHG Soap.   If you chose to wash your hair, wash your hair first as usual with your normal shampoo. After you shampoo, rinse your hair and body thoroughly to remove the shampoo.  Then [employer] and genitals (private parts) with your normal soap and rinse thoroughly to remove soap.  After that Use CHG Soap as you would any other liquid soap. You can apply CHG directly to the skin and wash gently with a scrungie or a clean washcloth.   Apply the CHG Soap to your body ONLY FROM THE NECK DOWN.  Do not use on open wounds or open sores. Avoid contact with your eyes, ears, mouth and genitals (private parts). Wash Face and genitals (private parts)  with your normal soap.   Wash thoroughly, paying special attention to the area where your surgery will be performed.  Thoroughly rinse your body with warm water from the neck down.  DO NOT shower/wash with your normal soap after using and rinsing off the CHG Soap.  Pat yourself dry with a CLEAN TOWEL.  Wear CLEAN PAJAMAS to bed the night before surgery  Place CLEAN SHEETS on your bed the night before your surgery  DO NOT SLEEP WITH PETS.   Day of Surgery:  Take a shower with CHG soap. Wear Clean/Comfortable clothing the morning of surgery Do not apply any deodorants/lotions.   Remember to brush your teeth WITH YOUR REGULAR TOOTHPASTE.   Please read over the following fact sheets that you were given.

## 2020-10-28 ENCOUNTER — Encounter (HOSPITAL_COMMUNITY): Payer: Self-pay

## 2020-10-28 ENCOUNTER — Encounter (HOSPITAL_COMMUNITY)
Admission: RE | Admit: 2020-10-28 | Discharge: 2020-10-28 | Disposition: A | Discharge status: Home / Self Care | Payer: Medicare Other | Source: Ambulatory Visit | Attending: Cardiovascular Disease | Admitting: Cardiovascular Disease

## 2020-10-28 ENCOUNTER — Other Ambulatory Visit: Payer: Self-pay

## 2020-10-28 ENCOUNTER — Ambulatory Visit (HOSPITAL_COMMUNITY)
Admission: RE | Admit: 2020-10-28 | Discharge: 2020-10-28 | Disposition: A | Discharge status: Home / Self Care | Payer: Medicare Other | Source: Ambulatory Visit | Attending: Cardiovascular Disease | Admitting: Cardiovascular Disease

## 2020-10-28 VITALS — BP 134/66 | HR 77 | Temp 98.0°F | Resp 19 | Ht 63.0 in | Wt 167.0 lb

## 2020-10-28 DIAGNOSIS — Z20822 Contact with and (suspected) exposure to covid-19: Secondary | ICD-10-CM | POA: Insufficient documentation

## 2020-10-28 DIAGNOSIS — I350 Nonrheumatic aortic (valve) stenosis: Secondary | ICD-10-CM | POA: Insufficient documentation

## 2020-10-28 DIAGNOSIS — Z01818 Encounter for other preprocedural examination: Secondary | ICD-10-CM

## 2020-10-28 LAB — TYPE AND SCREEN
ABO/RH(D): A POS
Antibody Screen: NEGATIVE

## 2020-10-28 LAB — CBC
HCT: 43.1 % (ref 36.0–46.0)
Hemoglobin: 14.2 g/dL (ref 12.0–15.0)
MCH: 31.3 pg (ref 26.0–34.0)
MCHC: 32.9 g/dL (ref 30.0–36.0)
MCV: 94.9 fL (ref 80.0–100.0)
Platelets: 271 10*3/uL (ref 150–400)
RBC: 4.54 MIL/uL (ref 3.87–5.11)
RDW: 11.4 % — ABNORMAL LOW (ref 11.5–15.5)
WBC: 7.1 10*3/uL (ref 4.0–10.5)
nRBC: 0 % (ref 0.0–0.2)

## 2020-10-28 LAB — URINALYSIS, ROUTINE W REFLEX MICROSCOPIC
Bacteria, UA: NONE SEEN
Bilirubin Urine: NEGATIVE
Glucose, UA: NEGATIVE mg/dL
Ketones, ur: NEGATIVE mg/dL
Leukocytes,Ua: NEGATIVE
Nitrite: NEGATIVE
Protein, ur: NEGATIVE mg/dL
Specific Gravity, Urine: 1.004 — ABNORMAL LOW (ref 1.005–1.030)
pH: 5 (ref 5.0–8.0)

## 2020-10-28 LAB — BLOOD GAS, ARTERIAL
Acid-Base Excess: 1.3 mmol/L (ref 0.0–2.0)
Bicarbonate: 25.8 mmol/L (ref 20.0–28.0)
FIO2: 21
O2 Saturation: 97.5 %
Patient temperature: 37
pCO2 arterial: 43.9 mmHg (ref 32.0–48.0)
pH, Arterial: 7.387 (ref 7.350–7.450)
pO2, Arterial: 94.7 mmHg (ref 83.0–108.0)

## 2020-10-28 LAB — COMPREHENSIVE METABOLIC PANEL
ALT: 20 U/L (ref 0–44)
AST: 18 U/L (ref 15–41)
Albumin: 3.9 g/dL (ref 3.5–5.0)
Alkaline Phosphatase: 71 U/L (ref 38–126)
Anion gap: 9 (ref 5–15)
BUN: 16 mg/dL (ref 8–23)
CO2: 22 mmol/L (ref 22–32)
Calcium: 9 mg/dL (ref 8.9–10.3)
Chloride: 104 mmol/L (ref 98–111)
Creatinine, Ser: 0.6 mg/dL (ref 0.44–1.00)
GFR, Estimated: >60 mL/min (ref >60)
Glucose, Bld: 99 mg/dL (ref 70–99)
Potassium: 4 mmol/L (ref 3.5–5.1)
Sodium: 135 mmol/L (ref 135–145)
Total Bilirubin: 0.7 mg/dL (ref 0.3–1.2)
Total Protein: 6.9 g/dL (ref 6.5–8.1)

## 2020-10-28 LAB — PROTIME-INR
INR: 1 (ref 0.8–1.2)
Prothrombin Time: 13.5 seconds (ref 11.4–15.2)

## 2020-10-28 LAB — SARS CORONAVIRUS 2 (TAT 6-24 HRS): SARS Coronavirus 2: NEGATIVE

## 2020-10-28 LAB — SURGICAL PCR SCREEN
MRSA, PCR: NEGATIVE
Staphylococcus aureus: NEGATIVE

## 2020-10-28 NOTE — Progress Notes (Signed)
Abnormal lab in PAT - Urinalysis: Hgb urine dipstick - small; Specific Gravity Urine 1.004. A message was send to Theodosia Quay, RN from Dr. Burt Knack office.

## 2020-10-28 NOTE — Progress Notes (Signed)
PCP - Stoney Bang, MD Cardiologist - Rozann Lesches, MD  PPM/ICD - denies Device Orders - n/a Rep Notified - n/a  Chest x-ray - 10/28/2020 EKG - 10/28/2020 Stress Test -  ECHO - 08/25/2020 Cardiac Cath - 09/05/2020  Sleep Study - denies CPAP - n/a  Fasting Blood Sugar - n/a  Blood Thinner Instructions: n/a  Patient was instructed:  STOP on Tuesday (10/18) taking Aleve, Naproxen, Ibuprofen, Motrin, Advil, Goody's, BC's, all herbal medications, fish oil, and all vitamins. Continue taking all other medications without change through the day before surgery. On the morning of surgery do not take any medications.    ERAS Protcol - no PRE-SURGERY Ensure or G2- n/a  COVID TEST- yes, done in PAT on 10/28/2020   Anesthesia review: yes, cardiac history  Patient denies shortness of breath, fever, cough and chest pain at PAT appointment   All instructions explained to the patient, with a verbal understanding of the material. Patient agrees to go over the instructions while at home for a better understanding. Patient also instructed to self quarantine after being tested for COVID-19. The opportunity to ask questions was provided.

## 2020-10-31 MED ORDER — MAGNESIUM SULFATE 50 % IJ SOLN
40.0000 meq | INTRAMUSCULAR | Status: DC
  Filled 2020-10-31: qty 9.85

## 2020-10-31 MED ORDER — DEXMEDETOMIDINE HCL IN NACL 400 MCG/100ML IV SOLN
0.1000 ug/kg/h | INTRAVENOUS | Status: DC
Start: 2020-11-01 — End: 2020-11-01
  Filled 2020-10-31: qty 100

## 2020-10-31 MED ORDER — HEPARIN 30,000 UNITS/1000 ML (OHS) CELLSAVER SOLUTION
Status: DC
  Filled 2020-10-31: qty 1000

## 2020-10-31 MED ORDER — POTASSIUM CHLORIDE 2 MEQ/ML IV SOLN
80.0000 meq | INTRAVENOUS | Status: DC
  Filled 2020-10-31: qty 40

## 2020-10-31 MED ORDER — NOREPINEPHRINE 4 MG/250ML-% IV SOLN
0.0000 ug/min | INTRAVENOUS | Status: AC
  Administered 2020-11-01: 2 ug/min via INTRAVENOUS
  Filled 2020-10-31: qty 250

## 2020-10-31 MED ORDER — CEFAZOLIN SODIUM-DEXTROSE 2-4 GM/100ML-% IV SOLN
2.0000 g | INTRAVENOUS | Status: AC
  Administered 2020-11-01: 2 g via INTRAVENOUS
  Filled 2020-10-31: qty 100

## 2020-10-31 NOTE — Anesthesia Preprocedure Evaluation (Addendum)
Anesthesia Evaluation  Patient identified by MRN, date of birth, ID band Patient awake    Reviewed: Allergy & Precautions, NPO status , Patient's Chart, lab work & pertinent test results  Airway Mallampati: II  TM Distance: >3 FB Neck ROM: Full    Dental  (+) Teeth Intact, Dental Advisory Given, Partial Upper, Caps   Pulmonary neg pulmonary ROS,  H/o Covid 7/22   Pulmonary exam normal breath sounds clear to auscultation       Cardiovascular + Valvular Problems/Murmurs AS  Rhythm:Regular Rate:Normal + Systolic murmurs    Neuro/Psych PSYCHIATRIC DISORDERS Anxiety negative neurological ROS     GI/Hepatic Neg liver ROS, GERD  Medicated,  Endo/Other  negative endocrine ROS  Renal/GU negative Renal ROS     Musculoskeletal  (+) Arthritis ,   Abdominal   Peds  Hematology negative hematology ROS (+)   Anesthesia Other Findings   Reproductive/Obstetrics                           Anesthesia Physical Anesthesia Plan  ASA: 4  Anesthesia Plan: General   Post-op Pain Management:    Induction: Intravenous  PONV Risk Score and Plan: 3 and Dexamethasone, Ondansetron and Treatment may vary due to age or medical condition  Airway Management Planned: Natural Airway and Simple Face Mask  Additional Equipment: Arterial line  Intra-op Plan:   Post-operative Plan:   Informed Consent: I have reviewed the patients History and Physical, chart, labs and discussed the procedure including the risks, benefits and alternatives for the proposed anesthesia with the patient or authorized representative who has indicated his/her understanding and acceptance.     Dental advisory given  Plan Discussed with: CRNA  Anesthesia Plan Comments:        Anesthesia Quick Evaluation

## 2020-11-01 ENCOUNTER — Encounter (HOSPITAL_COMMUNITY): Payer: Self-pay | Admitting: Cardiovascular Disease

## 2020-11-01 ENCOUNTER — Inpatient Hospital Stay (HOSPITAL_COMMUNITY): Payer: Medicare Other | Admitting: Anesthesiology

## 2020-11-01 ENCOUNTER — Other Ambulatory Visit: Payer: Self-pay

## 2020-11-01 ENCOUNTER — Inpatient Hospital Stay (HOSPITAL_COMMUNITY)
Admission: RE | Admit: 2020-11-01 | Discharge: 2020-11-02 | DRG: 267 | Disposition: A | Discharge status: Home / Self Care | Payer: Medicare Other | Source: Ambulatory Visit | Attending: Cardiovascular Disease | Admitting: Cardiovascular Disease

## 2020-11-01 ENCOUNTER — Other Ambulatory Visit: Payer: Self-pay | Admitting: Cardiology

## 2020-11-01 ENCOUNTER — Inpatient Hospital Stay (HOSPITAL_COMMUNITY): Payer: Medicare Other

## 2020-11-01 ENCOUNTER — Inpatient Hospital Stay (HOSPITAL_COMMUNITY): Payer: Medicare Other | Admitting: Physician Assistant

## 2020-11-01 ENCOUNTER — Encounter (HOSPITAL_COMMUNITY)
Admission: RE | Disposition: A | Discharge status: Home / Self Care | Payer: Self-pay | Source: Ambulatory Visit | Attending: Cardiovascular Disease

## 2020-11-01 DIAGNOSIS — Z952 Presence of prosthetic heart valve: Secondary | ICD-10-CM

## 2020-11-01 DIAGNOSIS — Z7982 Long term (current) use of aspirin: Secondary | ICD-10-CM

## 2020-11-01 DIAGNOSIS — Z79899 Other long term (current) drug therapy: Secondary | ICD-10-CM | POA: Diagnosis not present

## 2020-11-01 DIAGNOSIS — Z823 Family history of stroke: Secondary | ICD-10-CM

## 2020-11-01 DIAGNOSIS — K769 Liver disease, unspecified: Secondary | ICD-10-CM | POA: Diagnosis present

## 2020-11-01 DIAGNOSIS — I352 Nonrheumatic aortic (valve) stenosis with insufficiency: Secondary | ICD-10-CM | POA: Diagnosis not present

## 2020-11-01 DIAGNOSIS — I358 Other nonrheumatic aortic valve disorders: Secondary | ICD-10-CM | POA: Diagnosis present

## 2020-11-01 DIAGNOSIS — Z8249 Family history of ischemic heart disease and other diseases of the circulatory system: Secondary | ICD-10-CM

## 2020-11-01 DIAGNOSIS — I350 Nonrheumatic aortic (valve) stenosis: Secondary | ICD-10-CM

## 2020-11-01 DIAGNOSIS — F419 Anxiety disorder, unspecified: Secondary | ICD-10-CM | POA: Diagnosis present

## 2020-11-01 DIAGNOSIS — K219 Gastro-esophageal reflux disease without esophagitis: Secondary | ICD-10-CM | POA: Diagnosis not present

## 2020-11-01 DIAGNOSIS — M1990 Unspecified osteoarthritis, unspecified site: Secondary | ICD-10-CM | POA: Diagnosis not present

## 2020-11-01 DIAGNOSIS — Z8616 Personal history of COVID-19: Secondary | ICD-10-CM | POA: Diagnosis not present

## 2020-11-01 DIAGNOSIS — Z006 Encounter for examination for normal comparison and control in clinical research program: Secondary | ICD-10-CM

## 2020-11-01 HISTORY — PX: TEE WITHOUT CARDIOVERSION: SHX5443

## 2020-11-01 HISTORY — DX: Presence of prosthetic heart valve: Z95.2

## 2020-11-01 HISTORY — DX: Nonrheumatic aortic (valve) stenosis: I35.0

## 2020-11-01 HISTORY — PX: TRANSCATHETER AORTIC VALVE REPLACEMENT, TRANSFEMORAL: SHX6400

## 2020-11-01 LAB — POCT I-STAT, CHEM 8
BUN: 14 mg/dL (ref 8–23)
BUN: 13 mg/dL (ref 8–23)
BUN: 13 mg/dL (ref 8–23)
BUN: 13 mg/dL (ref 8–23)
Calcium, Ion: 1.19 mmol/L (ref 1.15–1.40)
Calcium, Ion: 1.16 mmol/L (ref 1.15–1.40)
Calcium, Ion: 1.17 mmol/L (ref 1.15–1.40)
Calcium, Ion: 1.22 mmol/L (ref 1.15–1.40)
Chloride: 103 mmol/L (ref 98–111)
Chloride: 103 mmol/L (ref 98–111)
Chloride: 103 mmol/L (ref 98–111)
Chloride: 103 mmol/L (ref 98–111)
Creatinine, Ser: 0.5 mg/dL (ref 0.44–1.00)
Creatinine, Ser: 0.5 mg/dL (ref 0.44–1.00)
Creatinine, Ser: 0.6 mg/dL (ref 0.44–1.00)
Creatinine, Ser: 0.6 mg/dL (ref 0.44–1.00)
Glucose, Bld: 101 mg/dL — ABNORMAL HIGH (ref 70–99)
Glucose, Bld: 122 mg/dL — ABNORMAL HIGH (ref 70–99)
Glucose, Bld: 132 mg/dL — ABNORMAL HIGH (ref 70–99)
Glucose, Bld: 111 mg/dL — ABNORMAL HIGH (ref 70–99)
HCT: 37 % (ref 36.0–46.0)
HCT: 33 % — ABNORMAL LOW (ref 36.0–46.0)
HCT: 35 % — ABNORMAL LOW (ref 36.0–46.0)
HCT: 37 % (ref 36.0–46.0)
Hemoglobin: 12.6 g/dL (ref 12.0–15.0)
Hemoglobin: 11.2 g/dL — ABNORMAL LOW (ref 12.0–15.0)
Hemoglobin: 11.9 g/dL — ABNORMAL LOW (ref 12.0–15.0)
Hemoglobin: 12.6 g/dL (ref 12.0–15.0)
Potassium: 3.6 mmol/L (ref 3.5–5.1)
Potassium: 3.3 mmol/L — ABNORMAL LOW (ref 3.5–5.1)
Potassium: 3.5 mmol/L (ref 3.5–5.1)
Potassium: 3.7 mmol/L (ref 3.5–5.1)
Sodium: 141 mmol/L (ref 135–145)
Sodium: 139 mmol/L (ref 135–145)
Sodium: 139 mmol/L (ref 135–145)
Sodium: 140 mmol/L (ref 135–145)
TCO2: 29 mmol/L (ref 22–32)
TCO2: 26 mmol/L (ref 22–32)
TCO2: 27 mmol/L (ref 22–32)
TCO2: 25 mmol/L (ref 22–32)

## 2020-11-01 LAB — ECHO TEE
AR max vel: 1.92 cm2
AV Area VTI: 2.41 cm2
AV Area mean vel: 1.51 cm2
AV Mean grad: 5 mmHg
AV Peak grad: 12 mmHg
Ao pk vel: 1.73 m/s

## 2020-11-01 LAB — ABO/RH: ABO/RH(D): A POS

## 2020-11-01 SURGERY — IMPLANTATION, AORTIC VALVE, TRANSCATHETER, FEMORAL APPROACH
Anesthesia: General | Site: Chest

## 2020-11-01 MED ORDER — NITROGLYCERIN IN D5W 200-5 MCG/ML-% IV SOLN: 0.0000 ug/min | INTRAVENOUS | Status: DC

## 2020-11-01 MED ORDER — SUGAMMADEX SODIUM 200 MG/2ML IV SOLN
INTRAVENOUS | Status: DC | PRN
  Administered 2020-11-01: 200 mg via INTRAVENOUS

## 2020-11-01 MED ORDER — HEPARIN SODIUM (PORCINE) 1000 UNIT/ML IJ SOLN
INTRAMUSCULAR | Status: DC | PRN
  Administered 2020-11-01: 8000 [IU] via INTRAVENOUS

## 2020-11-01 MED ORDER — CHLORHEXIDINE GLUCONATE 4 % EX LIQD: 30.0000 mL | CUTANEOUS | Status: DC

## 2020-11-01 MED ORDER — ONDANSETRON HCL 4 MG/2ML IJ SOLN
INTRAMUSCULAR | Status: AC
  Filled 2020-11-01: qty 2

## 2020-11-01 MED ORDER — SODIUM CHLORIDE 0.9% FLUSH: 3.0000 mL | INTRAVENOUS | Status: DC | PRN

## 2020-11-01 MED ORDER — CHLORHEXIDINE GLUCONATE 0.12 % MT SOLN
15.0000 mL | Freq: Once | OROMUCOSAL | Status: AC
  Administered 2020-11-01: 15 mL via OROMUCOSAL
  Filled 2020-11-01: qty 15

## 2020-11-01 MED ORDER — CHLORHEXIDINE GLUCONATE 4 % EX LIQD: 60.0000 mL | Freq: Once | CUTANEOUS | Status: DC

## 2020-11-01 MED ORDER — ONDANSETRON HCL 4 MG/2ML IJ SOLN: 4.0000 mg | Freq: Four times a day (QID) | INTRAMUSCULAR | Status: DC | PRN

## 2020-11-01 MED ORDER — ACETAMINOPHEN 500 MG PO TABS
ORAL_TABLET | ORAL | Status: AC
  Administered 2020-11-01: 1000 mg via ORAL
  Filled 2020-11-01: qty 2

## 2020-11-01 MED ORDER — PHENYLEPHRINE 40 MCG/ML (10ML) SYRINGE FOR IV PUSH (FOR BLOOD PRESSURE SUPPORT)
PREFILLED_SYRINGE | INTRAVENOUS | Status: DC | PRN
  Administered 2020-11-01: 80 ug via INTRAVENOUS

## 2020-11-01 MED ORDER — FENTANYL CITRATE (PF) 250 MCG/5ML IJ SOLN
INTRAMUSCULAR | Status: AC
  Filled 2020-11-01: qty 5

## 2020-11-01 MED ORDER — LACTATED RINGERS IV SOLN: INTRAVENOUS | Status: DC | PRN

## 2020-11-01 MED ORDER — ROCURONIUM BROMIDE 10 MG/ML (PF) SYRINGE
PREFILLED_SYRINGE | INTRAVENOUS | Status: DC | PRN
Start: 2020-11-01 — End: 2020-11-01
  Administered 2020-11-01: 60 mg via INTRAVENOUS

## 2020-11-01 MED ORDER — CLOPIDOGREL BISULFATE 75 MG PO TABS
75.0000 mg | ORAL_TABLET | Freq: Every day | ORAL | Status: DC
Start: 2020-11-02 — End: 2020-11-02
  Administered 2020-11-02: 75 mg via ORAL
  Filled 2020-11-01: qty 1

## 2020-11-01 MED ORDER — LIDOCAINE 2% (20 MG/ML) 5 ML SYRINGE
INTRAMUSCULAR | Status: DC | PRN
  Administered 2020-11-01: 80 mg via INTRAVENOUS

## 2020-11-01 MED ORDER — SODIUM CHLORIDE 0.9 % IV SOLN: INTRAVENOUS | Status: DC

## 2020-11-01 MED ORDER — SODIUM CHLORIDE 0.9 % IV SOLN: 250.0000 mL | INTRAVENOUS | Status: DC | PRN

## 2020-11-01 MED ORDER — PANTOPRAZOLE SODIUM 40 MG PO TBEC
40.0000 mg | DELAYED_RELEASE_TABLET | Freq: Every day | ORAL | Status: DC
  Administered 2020-11-01 – 2020-11-02 (×2): 40 mg via ORAL
  Filled 2020-11-01 (×2): qty 1

## 2020-11-01 MED ORDER — HEPARIN 6000 UNIT IRRIGATION SOLUTION
Status: DC | PRN
  Administered 2020-11-01: 3

## 2020-11-01 MED ORDER — PROPOFOL 10 MG/ML IV BOLUS
INTRAVENOUS | Status: DC | PRN
  Administered 2020-11-01: 100 mg via INTRAVENOUS

## 2020-11-01 MED ORDER — PROTAMINE SULFATE 10 MG/ML IV SOLN
INTRAVENOUS | Status: DC | PRN
  Administered 2020-11-01: 80 mg via INTRAVENOUS

## 2020-11-01 MED ORDER — DEXAMETHASONE SODIUM PHOSPHATE 10 MG/ML IJ SOLN
INTRAMUSCULAR | Status: DC | PRN
  Administered 2020-11-01: 5 mg via INTRAVENOUS

## 2020-11-01 MED ORDER — PROPOFOL 10 MG/ML IV BOLUS
INTRAVENOUS | Status: AC
  Filled 2020-11-01: qty 20

## 2020-11-01 MED ORDER — SODIUM CHLORIDE 0.9 % IV SOLN: 250.0000 mL | INTRAVENOUS | Status: DC

## 2020-11-01 MED ORDER — OXYCODONE HCL 5 MG PO TABS
5.0000 mg | ORAL_TABLET | ORAL | Status: DC | PRN
  Administered 2020-11-01: 10 mg via ORAL
  Filled 2020-11-01: qty 2

## 2020-11-01 MED ORDER — ACETAMINOPHEN 325 MG PO TABS
650.0000 mg | ORAL_TABLET | Freq: Four times a day (QID) | ORAL | Status: DC | PRN
  Administered 2020-11-01: 650 mg via ORAL
  Filled 2020-11-01: qty 2

## 2020-11-01 MED ORDER — ACETAMINOPHEN 500 MG PO TABS: 1000.0000 mg | ORAL_TABLET | Freq: Once | ORAL | Status: AC

## 2020-11-01 MED ORDER — ACETAMINOPHEN 650 MG RE SUPP: 650.0000 mg | Freq: Four times a day (QID) | RECTAL | Status: DC | PRN

## 2020-11-01 MED ORDER — TRAMADOL HCL 50 MG PO TABS: 50.0000 mg | ORAL_TABLET | ORAL | Status: DC | PRN

## 2020-11-01 MED ORDER — FENTANYL CITRATE (PF) 250 MCG/5ML IJ SOLN
INTRAMUSCULAR | Status: DC | PRN
  Administered 2020-11-01: 100 ug via INTRAVENOUS

## 2020-11-01 MED ORDER — LIDOCAINE 2% (20 MG/ML) 5 ML SYRINGE
INTRAMUSCULAR | Status: AC
  Filled 2020-11-01: qty 5

## 2020-11-01 MED ORDER — IODIXANOL 320 MG/ML IV SOLN
INTRAVENOUS | Status: DC | PRN
  Administered 2020-11-01: 80.5 mL via INTRA_ARTERIAL

## 2020-11-01 MED ORDER — ASPIRIN 81 MG PO CHEW
81.0000 mg | CHEWABLE_TABLET | Freq: Every day | ORAL | Status: DC
  Administered 2020-11-02: 81 mg via ORAL
  Filled 2020-11-01: qty 1

## 2020-11-01 MED ORDER — PHENYLEPHRINE 40 MCG/ML (10ML) SYRINGE FOR IV PUSH (FOR BLOOD PRESSURE SUPPORT)
PREFILLED_SYRINGE | INTRAVENOUS | Status: AC
  Filled 2020-11-01: qty 10

## 2020-11-01 MED ORDER — SODIUM CHLORIDE 0.9 % IV SOLN: INTRAVENOUS | Status: AC

## 2020-11-01 MED ORDER — ONDANSETRON HCL 4 MG/2ML IJ SOLN
INTRAMUSCULAR | Status: DC | PRN
  Administered 2020-11-01: 4 mg via INTRAVENOUS

## 2020-11-01 MED ORDER — SODIUM CHLORIDE 0.9% FLUSH
3.0000 mL | Freq: Two times a day (BID) | INTRAVENOUS | Status: DC
  Administered 2020-11-01: 3 mL via INTRAVENOUS

## 2020-11-01 MED ORDER — MORPHINE SULFATE (PF) 2 MG/ML IV SOLN: 1.0000 mg | INTRAVENOUS | Status: DC | PRN

## 2020-11-01 MED ORDER — DEXAMETHASONE SODIUM PHOSPHATE 10 MG/ML IJ SOLN
INTRAMUSCULAR | Status: AC
  Filled 2020-11-01: qty 1

## 2020-11-01 MED ORDER — CEFAZOLIN SODIUM-DEXTROSE 2-4 GM/100ML-% IV SOLN
2.0000 g | Freq: Three times a day (TID) | INTRAVENOUS | Status: AC
  Administered 2020-11-01 (×2): 2 g via INTRAVENOUS
  Filled 2020-11-01 (×2): qty 100

## 2020-11-01 MED ORDER — HEPARIN 6000 UNIT IRRIGATION SOLUTION
Status: AC
  Filled 2020-11-01: qty 1500

## 2020-11-01 MED ORDER — ROCURONIUM BROMIDE 10 MG/ML (PF) SYRINGE
PREFILLED_SYRINGE | INTRAVENOUS | Status: AC
  Filled 2020-11-01: qty 10

## 2020-11-01 SURGICAL SUPPLY — 87 items
BAG COUNTER SPONGE SURGICOUNT (BAG) ×3 IMPLANT
BAG DECANTER FOR FLEXI CONT (MISCELLANEOUS) IMPLANT
BAG SNAP BAND KOVER 36X36 (MISCELLANEOUS) ×4 IMPLANT
BAG SURGICOUNT SPONGE COUNTING (BAG) ×1
BLADE CLIPPER SURG (BLADE) ×4 IMPLANT
BLADE STERNUM SYSTEM 6 (BLADE) IMPLANT
BLADE SURG 10 STRL SS (BLADE) IMPLANT
CABLE ADAPT CONN TEMP 6FT (ADAPTER) ×4 IMPLANT
CANISTER SUCT 3000ML PPV (MISCELLANEOUS) IMPLANT
CATH DIAG EXPO 6F AL1 (CATHETERS) IMPLANT
CATH DIAG EXPO 6F VENT PIG 145 (CATHETERS) ×8 IMPLANT
CATH EXTERNAL FEMALE PUREWICK (CATHETERS) IMPLANT
CATH INFINITI 6F AL2 (CATHETERS) IMPLANT
CATH S G BIP PACING (CATHETERS) ×4 IMPLANT
CHLORAPREP W/TINT 26 (MISCELLANEOUS) ×4 IMPLANT
CLIP VESOCCLUDE MED 24/CT (CLIP) IMPLANT
CLIP VESOCCLUDE SM WIDE 24/CT (CLIP) IMPLANT
CLOSURE MYNX CONTROL 6F/7F (Vascular Products) ×4 IMPLANT
CNTNR URN SCR LID CUP LEK RST (MISCELLANEOUS) ×4 IMPLANT
CONT SPEC 4OZ STRL OR WHT (MISCELLANEOUS) ×6
COVER BACK TABLE 80X110 HD (DRAPES) IMPLANT
DECANTER SPIKE VIAL GLASS SM (MISCELLANEOUS) ×4 IMPLANT
DERMABOND ADHESIVE PROPEN (GAUZE/BANDAGES/DRESSINGS) ×4
DERMABOND ADVANCED (GAUZE/BANDAGES/DRESSINGS) ×2
DERMABOND ADVANCED .7 DNX12 (GAUZE/BANDAGES/DRESSINGS) ×2 IMPLANT
DERMABOND ADVANCED .7 DNX6 (GAUZE/BANDAGES/DRESSINGS) ×4 IMPLANT
DEVICE CLOSURE PERCLS PRGLD 6F (VASCULAR PRODUCTS) ×4 IMPLANT
DRAPE INCISE IOBAN 66X45 STRL (DRAPES) IMPLANT
DRSG TEGADERM 4X4.75 (GAUZE/BANDAGES/DRESSINGS) ×8 IMPLANT
ELECT CAUTERY BLADE 6.4 (BLADE) IMPLANT
ELECT REM PT RETURN 9FT ADLT (ELECTROSURGICAL) ×8
ELECTRODE REM PT RTRN 9FT ADLT (ELECTROSURGICAL) ×4 IMPLANT
FELT TEFLON 6X6 (MISCELLANEOUS) ×4 IMPLANT
GAUZE SPONGE 4X4 12PLY STRL (GAUZE/BANDAGES/DRESSINGS) ×4 IMPLANT
GLOVE EUDERMIC 7 POWDERFREE (GLOVE) IMPLANT
GLOVE SURG ENC MOIS LTX SZ7.5 (GLOVE) IMPLANT
GLOVE SURG ENC MOIS LTX SZ8 (GLOVE) IMPLANT
GLOVE SURG ORTHO LTX SZ7.5 (GLOVE) IMPLANT
GOWN STRL REUS W/ TWL LRG LVL3 (GOWN DISPOSABLE) IMPLANT
GOWN STRL REUS W/ TWL XL LVL3 (GOWN DISPOSABLE) ×2 IMPLANT
GOWN STRL REUS W/TWL LRG LVL3 (GOWN DISPOSABLE)
GOWN STRL REUS W/TWL XL LVL3 (GOWN DISPOSABLE) ×4
GUIDEWIRE SAF TJ AMPL .035X180 (WIRE) ×4 IMPLANT
GUIDEWIRE SAFE TJ AMPLATZ EXST (WIRE) ×8 IMPLANT
INSERT FOGARTY SM (MISCELLANEOUS) IMPLANT
KIT BASIN OR (CUSTOM PROCEDURE TRAY) ×4 IMPLANT
KIT HEART LEFT (KITS) ×4 IMPLANT
KIT SAPIAN 3 ULTRA RESILIA 23 (Valve) ×4 IMPLANT
KIT SUCTION CATH 14FR (SUCTIONS) IMPLANT
KIT TURNOVER KIT B (KITS) ×4 IMPLANT
LOOP VESSEL MAXI BLUE (MISCELLANEOUS) IMPLANT
LOOP VESSEL MINI RED (MISCELLANEOUS) IMPLANT
NS IRRIG 1000ML POUR BTL (IV SOLUTION) ×4 IMPLANT
PACK ENDO MINOR (CUSTOM PROCEDURE TRAY) ×4 IMPLANT
PAD ARMBOARD 7.5X6 YLW CONV (MISCELLANEOUS) ×8 IMPLANT
PAD ELECT DEFIB RADIOL ZOLL (MISCELLANEOUS) ×4 IMPLANT
PENCIL BUTTON HOLSTER BLD 10FT (ELECTRODE) IMPLANT
PERCLOSE PROGLIDE 6F (VASCULAR PRODUCTS) ×8
POSITIONER HEAD DONUT 9IN (MISCELLANEOUS) ×4 IMPLANT
SET MICROPUNCTURE 5F STIFF (MISCELLANEOUS) ×4 IMPLANT
SHEATH BRITE TIP 7FR 35CM (SHEATH) ×4 IMPLANT
SHEATH PINNACLE 6F 10CM (SHEATH) ×4 IMPLANT
SHEATH PINNACLE 8F 10CM (SHEATH) ×4 IMPLANT
SHIELD RADPAD SCOOP 12X17 (MISCELLANEOUS) ×4 IMPLANT
SLEEVE REPOSITIONING LENGTH 30 (MISCELLANEOUS) ×4 IMPLANT
STOPCOCK MORSE 400PSI 3WAY (MISCELLANEOUS) ×8 IMPLANT
SUT ETHIBOND X763 2 0 SH 1 (SUTURE) IMPLANT
SUT GORETEX CV 4 TH 22 36 (SUTURE) IMPLANT
SUT GORETEX CV4 TH-18 (SUTURE) IMPLANT
SUT MNCRL AB 3-0 PS2 18 (SUTURE) IMPLANT
SUT PROLENE 5 0 C 1 36 (SUTURE) IMPLANT
SUT PROLENE 6 0 C 1 30 (SUTURE) IMPLANT
SUT SILK  1 MH (SUTURE) ×4
SUT SILK 1 MH (SUTURE) ×2 IMPLANT
SUT VIC AB 2-0 CT1 27 (SUTURE)
SUT VIC AB 2-0 CT1 TAPERPNT 27 (SUTURE) IMPLANT
SUT VIC AB 2-0 CTX 36 (SUTURE) IMPLANT
SUT VIC AB 3-0 SH 8-18 (SUTURE) IMPLANT
SYR 50ML LL SCALE MARK (SYRINGE) ×4 IMPLANT
SYR BULB IRRIG 60ML STRL (SYRINGE) IMPLANT
SYR MEDRAD MARK V 150ML (SYRINGE) ×4 IMPLANT
TOWEL GREEN STERILE (TOWEL DISPOSABLE) ×8 IMPLANT
TRANSDUCER W/STOPCOCK (MISCELLANEOUS) ×8 IMPLANT
TRAY FOLEY SLVR 14FR TEMP STAT (SET/KITS/TRAYS/PACK) ×4 IMPLANT
TUBE SUCT INTRACARD DLP 20F (MISCELLANEOUS) IMPLANT
WIRE EMERALD 3MM-J .035X150CM (WIRE) ×4 IMPLANT
WIRE EMERALD 3MM-J .035X260CM (WIRE) ×4 IMPLANT

## 2020-11-01 NOTE — Op Note (Signed)
[facility]   MULTIDISCIPLINARY HEART VALVE TEAM   TAVR OPERATIVE NOTE   Date of Procedure:  11/01/2020  Preoperative Diagnosis: Severe Aortic Stenosis   Postoperative Diagnosis: Same   Procedure:   Transcatheter Aortic Valve Replacement - Percutaneous Transfemoral Approach  Edwards Sapien 3 Ultra Resilia THV (size 23 mm, serial # [id number])   Co-Surgeons:  Gaye Pollack, MD, Lenna Sciara, MD, and Sherren Mocha, MD  Anesthesiologist:  Hoy Morn, MD  Echocardiographer:  Marry Guan, MD  Pre-operative Echo Findings: Severe aortic stenosis Normal left ventricular systolic function  Post-operative Echo Findings: Trace paravalvular leak Normal/unchanged left ventricular systolic function  BRIEF CLINICAL NOTE AND INDICATIONS FOR SURGERY  70 year old woman with severe, symptomatic aortic stenosis.  She was demonstrated to have a mean transaortic valve gradient of 46 mmHg and calculated valve area of 0.8 cm.She has developed New York Heart Association functional class II symptoms of exertional fatigue and dyspnea.  After undergoing extensive preoperative testing and multidisciplinary heart team evaluation, she is referred for TAVR.  During the course of the patient's preoperative work up they have been evaluated comprehensively by a multidisciplinary team of specialists coordinated through the [facility] in the [facility].  They have been demonstrated to suffer from symptomatic severe aortic stenosis as noted above. The patient has been counseled extensively as to the relative risks and benefits of all options for the treatment of severe aortic stenosis including long term medical therapy, conventional surgery for aortic valve replacement, and transcatheter aortic valve replacement.  The patient has been independently evaluated in formal cardiac surgical consultation by Dr Cyndia Bent, who deemed the patient appropriate  for TAVR. Based upon review of all of the patient's preoperative diagnostic tests they are felt to be candidate for transcatheter aortic valve replacement using the transfemoral approach as an alternative to conventional surgery.    Following the decision to proceed with transcatheter aortic valve replacement, a discussion has been held regarding what types of management strategies would be attempted intraoperatively in the event of life-threatening complications, including whether or not the patient would be considered a candidate for the use of cardiopulmonary bypass and/or conversion to open sternotomy for attempted surgical intervention.  The patient has been advised of a variety of complications that might develop peculiar to this approach including but not limited to risks of death, stroke, paravalvular leak, aortic dissection or other major vascular complications, aortic annulus rupture, device embolization, cardiac rupture or perforation, acute myocardial infarction, arrhythmia, heart block or bradycardia requiring permanent pacemaker placement, congestive heart failure, respiratory failure, renal failure, pneumonia, infection, other late complications related to structural valve deterioration or migration, or other complications that might ultimately cause a temporary or permanent loss of functional independence or other long term morbidity.  The patient provides full informed consent for the procedure as described and all questions were answered preoperatively.  DETAILS OF THE OPERATIVE PROCEDURE  PREPARATION:   The patient is brought to the operating room on the above mentioned date and central monitoring was established by the anesthesia team including placement of a radial arterial line. The patient is placed in the supine position on the operating table.  Intravenous antibiotics are administered. General endotracheal anesthesia is induced uneventfully.    Baseline transesophageal  echocardiogram is performed. The patient's chest, abdomen, both groins, and both lower extremities are prepared and draped in a sterile manner. A time out procedure is performed.   PERIPHERAL ACCESS:   Using ultrasound  guidance, femoral arterial and venous access is obtained with placement of 6 Fr sheaths on the left side.  [place] images are digitally captured and stored in the patient's chart. A pigtail diagnostic catheter was passed through the femoral arterial sheath under fluoroscopic guidance into the aortic root.  A temporary transvenous pacemaker catheter was passed through the femoral venous sheath under fluoroscopic guidance into the right ventricle.  The pacemaker was tested to ensure stable lead placement and pacemaker capture. Aortic root angiography was performed in order to determine the optimal angiographic angle for valve deployment.  TRANSFEMORAL ACCESS:  A micropuncture technique is used to access the right femoral artery under fluoroscopic and ultrasound guidance.  2 Perclose devices are deployed at 10' and 2' positions to 'PreClose' the femoral artery. An 8 French sheath is placed and then an Amplatz Superstiff wire is advanced through the sheath. This is changed out for a 14 French transfemoral E-Sheath after progressively dilating over the Superstiff wire.  An AL-1 catheter was used to direct a straight-tip exchange length wire across the native aortic valve into the left ventricle. This was exchanged out for a pigtail catheter and position was confirmed in the LV apex. Simultaneous LV and Ao pressures were recorded.  The pigtail catheter was exchanged for an Amplatz Extra-stiff wire in the LV apex.    BALLOON AORTIC VALVULOPLASTY:  Not performed  TRANSCATHETER HEART VALVE DEPLOYMENT:  An Edwards Sapien 3 transcatheter heart valve (size 23 mm) was prepared and crimped per manufacturer's guidelines, and the proper orientation of the valve is confirmed on the [employer] delivery  system. The valve was advanced through the introducer sheath using normal technique until in an appropriate position in the abdominal aorta beyond the sheath tip. The balloon was then retracted and using the fine-tuning wheel was centered on the valve. The valve was then advanced across the aortic arch using appropriate flexion of the catheter. The valve was carefully positioned across the aortic valve annulus. The Commander catheter was retracted using normal technique. Once final position of the valve has been confirmed by angiographic assessment, the valve is deployed while temporarily holding ventilation and during rapid ventricular pacing to maintain systolic blood pressure < 50 mmHg and pulse pressure < 10 mmHg. The balloon inflation is held for >3 seconds after reaching full deployment volume. Once the balloon has fully deflated the balloon is retracted into the ascending aorta and valve function is assessed using echocardiography. The patient's hemodynamic recovery following valve deployment is good.  The deployment balloon and guidewire are both removed. Echo demostrated acceptable post-procedural gradients, stable mitral valve function, and trace aortic insufficiency.    PROCEDURE COMPLETION:  The sheath was removed and femoral artery closure is performed using the 2 previously deployed Perclose devices.  Protamine is administered once femoral arterial repair was complete. The site is clear with no evidence of bleeding or hematoma after the sutures are tightened. The temporary pacemaker and pigtail catheters are removed. Mynx closure is used for contralateral femoral arterial hemostasis for the 6 Fr sheath.  The patient tolerated the procedure well and is transported to the recovery area in stable condition. There were no immediate intraoperative complications. All sponge instrument and needle counts are verified correct at completion of the operation.   The patient received a total of 80 mL of  intravenous contrast during the procedure.   Sherren Mocha, MD 11/01/2020 9:40 AM

## 2020-11-01 NOTE — Interval H&P Note (Signed)
History and Physical Interval Note:  11/01/2020 5:57 AM  Vivien F Connelley  has presented today for surgery, with the diagnosis of Severe Aortic Stenosis.  The various methods of treatment have been discussed with the patient and family. After consideration of risks, benefits and other options for treatment, the patient has consented to  Procedure(s): TRANSCATHETER AORTIC VALVE REPLACEMENT, TRANSFEMORAL (N/A) POSSIBLE TRANSCATHETER AORTIC VALVE REPLACEMENT, TRANSAPICAL (N/A) POSSIBLE TRANSCATHETER AORTIC VALVE REPLACEMENT, TRANSAORTIC (N/A) TRANSESOPHAGEAL ECHOCARDIOGRAM (TEE) (N/A) as a surgical intervention.  The patient's history has been reviewed, patient examined, no change in status, stable for surgery.  I have reviewed the patient's chart and labs.  Questions were answered to the patient's satisfaction.     Gaye Pollack

## 2020-11-01 NOTE — Op Note (Signed)
[facility]   MULTIDISCIPLINARY HEART VALVE TEAM   TAVR OPERATIVE NOTE   Date of Procedure:  11/01/2020  Preoperative Diagnosis: Severe Aortic Stenosis   Postoperative Diagnosis: Same   Procedure:   Transcatheter Aortic Valve Replacement - Percutaneous Right Transfemoral Approach  Edwards Sapien 3 Ultra RSLTHV (size 23 mm, model # 9755RSL serial # [id number])   Co-Surgeons:  Gaye Pollack, MD and Sherren Mocha, MD  and Lenna Sciara, MD   Anesthesiologist:  Hoy Morn, MD  Echocardiographer:  Marry Guan, MD  Pre-operative Echo Findings: Severe aortic stenosis Normal left ventricular systolic function  Post-operative Echo Findings: Trace paravalvular leak Normal left ventricular systolic function   BRIEF CLINICAL NOTE AND INDICATIONS FOR SURGERY  This 70 year old woman has stage D, severe, symptomatic aortic stenosis with New York Heart Association class II symptoms of exertional fatigue and shortness of breath as well as chest discomfort consistent with chronic diastolic congestive heart failure.  I have personally reviewed her 2D echocardiogram, cardiac catheterization, and CTA studies.  Her echocardiogram shows a severely calcified trileaflet aortic valve with severe leaflet thickening and restricted mobility.  The mean gradient is 46 mmHg with a valve area of 0.79 cm consistent with severe aortic stenosis.  There is mild aortic insufficiency.  The mitral valve appears rheumatic with mild calcification of the anterior and posterior leaflets with a mean gradient 4.5 mmHg and a peak gradient of 12 mmHg consistent with mild mitral valve stenosis.  There is mild mitral valve regurgitation.  Left ventricular ejection fraction was 55 to 60%.  I agree that aortic valve replacement is indicated in this patient for relief of her symptoms and to prevent progressive left ventricular deterioration.  She is a low risk patient but 70 years old with mild limitation due to  osteoarthritis and I think transcatheter aortic valve replacement would be a reasonable alternative to open surgical aortic valve replacement.  Her gated cardiac CTA shows anatomy suitable for transcatheter aortic valve placement using a SAPIEN 3 valve.  Her abdominal and pelvic CTA shows marginal size of the common femoral and external iliac arteries bilaterally.  There is no significant disease in them so I think it may be possible to pass a 14 [place] sheath.  Her subclavian and carotid arteries were also small.  I think transaortic or transapical insertion would be options if needed. The patient was counseled at length regarding treatment alternatives for management of severe symptomatic aortic stenosis. The risks and benefits of surgical intervention has been discussed in detail. Long-term prognosis with medical therapy was discussed. Alternative approaches such as conventional surgical aortic valve replacement, transcatheter aortic valve replacement, and palliative medical therapy were compared and contrasted at length. This discussion was placed in the context of the patient's own specific clinical presentation and past medical history. All of her questions have been addressed.    Following the decision to proceed with transcatheter aortic valve replacement, a discussion was held regarding what types of management strategies would be attempted intraoperatively in the event of life-threatening complications, including whether or not the patient would be considered a candidate for the use of cardiopulmonary bypass and/or conversion to open sternotomy for attempted surgical intervention.  She is a low surgical risk patient and I think she would be a candidate for emergent sternotomy to manage any intraoperative complications.  The patient is aware of the fact that transient use of cardiopulmonary bypass may be necessary. The patient has been advised of a variety of complications that  might develop including  but not limited to risks of death, stroke, paravalvular leak, aortic dissection or other major vascular complications, aortic annulus rupture, device embolization, cardiac rupture or perforation, mitral regurgitation, acute myocardial infarction, arrhythmia, heart block or bradycardia requiring permanent pacemaker placement, congestive heart failure, respiratory failure, renal failure, pneumonia, infection, other late complications related to structural valve deterioration or migration, or other complications that might ultimately cause a temporary or permanent loss of functional independence or other long term morbidity. The patient provides full informed consent for the procedure as described and all questions were answered.      DETAILS OF THE OPERATIVE PROCEDURE  PREPARATION:    The patient was brought to the operating room on the above mentioned date and appropriate monitoring was established by the anesthesia team. The patient was placed in the supine position on the operating table.  Intravenous antibiotics were administered.  General endotracheal anesthesia was induced uneventfully.  A Foley catheter was placed.  Baseline transesophageal echocardiogram was performed. The patient's chest,abdomen and both groins were prepped and draped in a sterile manner. A time out procedure was performed.   PERIPHERAL ACCESS:    Using the modified Seldinger technique, femoral arterial and venous access was obtained with placement of 6 Fr sheaths on the left side.  A pigtail diagnostic catheter was passed through the left arterial sheath under fluoroscopic guidance into the aortic root.  A temporary transvenous pacemaker catheter was passed through the left femoral venous sheath under fluoroscopic guidance into the right ventricle.  The pacemaker was tested to ensure stable lead placement and pacemaker capture. Aortic root angiography was performed in order to determine the optimal angiographic angle for valve  deployment.   TRANSFEMORAL ACCESS:   Percutaneous transfemoral access and sheath placement was performed using ultrasound guidance.  The right common femoral artery was cannulated using a micropuncture needle and appropriate location was verified using hand injection angiogram.  A pair of [employer] Perclose percutaneous closure devices were placed and a 6 French sheath replaced into the femoral artery.  The patient was heparinized systemically and ACT verified > 250 seconds.    A 14 Fr transfemoral E-sheath was introduced into the right common femoral artery after progressively dilating over an Amplatz superstiff wire. An AL-1 catheter was used to direct a straight-tip exchange length wire across the native aortic valve into the left ventricle. This was exchanged out for a pigtail catheter and position was confirmed in the LV apex. Simultaneous LV and Ao pressures were recorded.  The pigtail catheter was exchanged for an Amplatz Extra-stiff wire in the LV apex.    BALLOON AORTIC VALVULOPLASTY:   Not performed   TRANSCATHETER HEART VALVE DEPLOYMENT:   An Edwards Sapien 3 Ultra RSL transcatheter heart valve (size 23 mm + 1cc) was prepared and crimped per manufacturer's guidelines, and the proper orientation of the valve is confirmed on the [employer] delivery system. The valve was advanced through the introducer sheath using normal technique until in an appropriate position in the abdominal aorta beyond the sheath tip. The balloon was then retracted and using the fine-tuning wheel was centered on the valve. The valve was then advanced across the aortic arch using appropriate flexion of the catheter. The valve was carefully positioned across the aortic valve annulus. The Commander catheter was retracted using normal technique. Once final position of the valve has been confirmed by angiographic assessment, the valve is deployed while temporarily holding ventilation and during rapid ventricular pacing  to maintain systolic  blood pressure < 50 mmHg and pulse pressure < 10 mmHg. The balloon inflation is held for >3 seconds after reaching full deployment volume. Once the balloon has fully deflated the balloon is retracted into the ascending aorta and valve function is assessed using echocardiography. There is felt to be trace paravalvular leak and no central aortic insufficiency.  The patient's hemodynamic recovery following valve deployment is good.  The deployment balloon and guidewire are both removed.    PROCEDURE COMPLETION:   The sheath was removed and femoral artery closure performed.  Protamine was administered once femoral arterial repair was complete. The temporary pacemaker, pigtail catheters and femoral sheaths were removed with manual pressure used for hemostasis.  A Mynx femoral closure device was utilized following removal of the diagnostic sheath in the left femoral artery.  The patient tolerated the procedure well and is transported to the cath lab recovery area in stable condition. There were no immediate intraoperative complications. All sponge instrument and needle counts are verified correct at completion of the operation.   No blood products were administered during the operation.  The patient received a total of 80 mL of intravenous contrast during the procedure.   Gaye Pollack, MD 11/01/2020

## 2020-11-01 NOTE — Anesthesia Procedure Notes (Signed)
Arterial Line Insertion Start/End10/25/2022 7:00 AM, 11/01/2020 7:10 AM Performed by: Janace Litten, CRNA, CRNA  Patient location: Pre-op. Preanesthetic checklist: patient identified, IV checked, site marked, risks and benefits discussed, surgical consent, monitors and equipment checked, pre-op evaluation, timeout performed and anesthesia consent Lidocaine 1% used for infiltration Right, radial was placed Catheter size: 20 G Hand hygiene performed  and maximum sterile barriers used   Attempts: 1 Procedure performed without using ultrasound guided technique. Following insertion, dressing applied and Biopatch. Post procedure assessment: normal and unchanged

## 2020-11-01 NOTE — Transfer of Care (Signed)
Immediate Anesthesia Transfer of Care Note  Patient: JARIANA SHUMARD  Procedure(s) Performed: TRANSCATHETER AORTIC VALVE REPLACEMENT, TRANSFEMORAL (Chest) TRANSESOPHAGEAL ECHOCARDIOGRAM (TEE) (Chest)  Patient Location: Cath Lab  Anesthesia Type:General  Level of Consciousness: awake and alert   Airway & Oxygen Therapy: Patient Spontanous Breathing  Post-op Assessment: Report given to RN and Post -op Vital signs reviewed and stable  Post vital signs: Reviewed and stable  Last Vitals:  Vitals Value Taken Time  BP 141/64 11/01/20 0938  Temp 36.1 C 11/01/20 0938  Pulse 83 11/01/20 0941  Resp 14 11/01/20 0941  SpO2 100 % 11/01/20 0941  Vitals shown include unvalidated device data.  Last Pain:  Vitals:   11/01/20 0938  TempSrc: Temporal  PainSc: 0-No pain         Complications: No notable events documented.

## 2020-11-01 NOTE — Progress Notes (Addendum)
  [place] VALVE TEAM  Patient doing well s/p TAVR. She is hemodynamically stable. Groin sites stable. ECG with no high grade block. Arterial line discontinued and transferred to 4E. Plan for early ambulation after bedrest completed and hopeful discharge over the next 24-48 hours.   Kathyrn Drown NP-C Structural Heart Team  Pager: [phone]

## 2020-11-01 NOTE — H&P (Signed)
[name]       ,[place] [address]             [phone]      Cardiothoracic Surgery Admission History and Physical   PCP is Hasanaj, Samul Dada, MD Referring Provider is Sherren Mocha, MD Primary Cardiologist is Rozann Lesches, MD   Reason for admission: Severe aortic stenosis   HPI:   The patient is a 70 year old woman with no prior history of heart disease who was told that she had a heart murmur earlier this year during a gynecology evaluation.  She was referred back to her PCP and had an echocardiogram showing severe aortic stenosis.  The mean gradient was 38 mmHg with a dimensionless index of 0.19.  Left ventricular ejection fraction was greater than 55% with mild mitral regurgitation.  She was referred to Dr. Domenic Polite and then subsequently Dr. Burt Knack for evaluation for aortic valve replacement.  She reports some heaviness in her chest as well as shortness of breath with moderate levels of activity.  She has had fatigue and tires more easily than in the past.  She has had some dizziness particularly when looking up high.  She had an episode of syncope several months ago when she had COVID-19 but is fully recovered.   She had a follow-up echocardiogram on 08/25/2020 showing a trileaflet aortic valve with severe calcification and thickening.  The mean gradient was 46 mmHg with a valve area of 0.79 cm consistent with severe aortic stenosis.  There is mild mitral valve stenosis with a mean gradient of 4.5 mmHg and a peak gradient of 12.2 mmHg.  The valve appeared rheumatic with calcification of the anterior and posterior leaflets and mild MR.  Cardiac catheterization showed normal coronary arteries with a peak to peak gradient 29 mmHg and a mean gradient of 22 mmHg.  Right heart pressures were normal.   She is married and lives with her husband.  She has 2 daughters who both live locally.  She receives regular dental visits and has had no problems with her  teeth.       Past Medical History:  Diagnosis Date   Anxiety     Arthritis     COVID-26 July 2020   GERD (gastroesophageal reflux disease)     Recurrent UTI             Past Surgical History:  Procedure Laterality Date   ABDOMINAL HYSTERECTOMY   1994    TAH BSO   CARPAL TUNNEL RELEASE Bilateral     RIGHT/LEFT HEART CATH AND CORONARY ANGIOGRAPHY N/A 09/05/2020    Procedure: RIGHT/LEFT HEART CATH AND CORONARY ANGIOGRAPHY;  Surgeon: Sherren Mocha, MD;  Location: [facility];  Service: Cardiovascular;  Laterality: N/A;   ROTATOR CUFF REPAIR Right     TUBAL LIGATION               Family History  Problem Relation Age of Onset   Heart failure Mother     Stroke Father     Bone cancer Sister     Pancreatic cancer Sister     Colitis Daughter        Social History         Socioeconomic History   Marital status: Married      Spouse name: Not on file   Number of children: Not on file   Years of education: Not on file   Highest  education level: Not on file  Occupational History   Not on file  Tobacco Use   Smoking status: Never   Smokeless tobacco: Never  Vaping Use   Vaping Use: Never used  Substance and Sexual Activity   Alcohol use: No   Drug use: No   Sexual activity: Not on file  Other Topics Concern   Not on file  Social History Narrative   Not on file    Social Determinants of Health    Financial Resource Strain: Not on file  Food Insecurity: Not on file  Transportation Needs: Not on file  Physical Activity: Not on file  Stress: Not on file  Social Connections: Not on file  Intimate Partner Violence: Not on file             Prior to Admission medications   Medication Sig Start Date End Date Taking? Authorizing Provider  ALPRAZolam (XANAX) 0.25 MG tablet Take 0.125 mg by mouth daily as needed for anxiety (anxiety/nerves).     Yes [provider]  Ascorbic Acid (VITAMIN C PO) Take 2 tablets by mouth every evening.     Yes  [provider]  aspirin EC 81 MG tablet Take 81 mg by mouth once. Swallow whole. Pre cath     Yes [provider]  celecoxib (CELEBREX) 100 MG capsule Take 100 mg by mouth daily as needed (pain.).     Yes [provider]  Cholecalciferol (VITAMIN D3) 50 MCG (2000 UT) TABS Take 2,000 Units by mouth every evening.     Yes [provider]  fexofenadine (ALLEGRA) 180 MG tablet Take 180 mg by mouth daily as needed for allergies or rhinitis.     Yes [provider]  metoprolol tartrate (LOPRESSOR) 50 MG tablet Take 1 tablet by mouth as directed prior to 9/12 CT scan 09/06/20   Yes Sherren Mocha, MD  Misc Natural Products (YEAST BALANCE ICS) CAPS Take 2 capsules by mouth every evening.     Yes [provider]  traMADol (ULTRAM) 50 MG tablet Take 50 mg by mouth daily as needed (pain.).     Yes [provider]  triamcinolone (NASACORT) 55 MCG/ACT AERO nasal inhaler Place 2 sprays into the nose daily as needed (allergies.).     Yes [provider]  Zinc 50 MG TABS Take 50 mg by mouth every evening.     Yes [provider]            Current Outpatient Medications  Medication Sig Dispense Refill   ALPRAZolam (XANAX) 0.25 MG tablet Take 0.125 mg by mouth daily as needed for anxiety (anxiety/nerves).       Ascorbic Acid (VITAMIN C PO) Take 2 tablets by mouth every evening.       aspirin EC 81 MG tablet Take 81 mg by mouth once. Swallow whole. Pre cath       celecoxib (CELEBREX) 100 MG capsule Take 100 mg by mouth daily as needed (pain.).       Cholecalciferol (VITAMIN D3) 50 MCG (2000 UT) TABS Take 2,000 Units by mouth every evening.       fexofenadine (ALLEGRA) 180 MG tablet Take 180 mg by mouth daily as needed for allergies or rhinitis.       metoprolol tartrate (LOPRESSOR) 50 MG tablet Take 1 tablet by mouth as directed prior to 9/12 CT scan 1 tablet 0   Misc Natural Products (YEAST BALANCE ICS) CAPS Take 2 capsules by  mouth every evening.  traMADol (ULTRAM) 50 MG tablet Take 50 mg by mouth daily as needed (pain.).       triamcinolone (NASACORT) 55 MCG/ACT AERO nasal inhaler Place 2 sprays into the nose daily as needed (allergies.).       Zinc 50 MG TABS Take 50 mg by mouth every evening.                 Current Facility-Administered Medications  Medication Dose Route Frequency Provider Last Rate Last Admin   0.9 %  sodium chloride infusion  500 mL Intravenous Once Danis, Estill Cotta III, MD          No Known Allergies       Review of Systems:               General:                      no appetite, + decreased energy, no weight gain, no weight loss, no fever             Cardiac:                       + chest pain with exertion, no chest pain at rest, +SOB with moderate exertion, no resting SOB, no PND, + orthopnea, no palpitations, no arrhythmia, no atrial fibrillation, no LE edema, + dizzy spells, + syncopeno             Respiratory:                 + exertional shortness of breath, no home oxygen, no productive cough, no dry cough, no bronchitis, no wheezing, no hemoptysis, no asthma, no pain with inspiration or cough, no sleep apnea, no CPAP at night             GI:                               no difficulty swallowing, no reflux, + frequent heartburn, no hiatal hernia, + abdominal pain, no constipation, no diarrhea, no hematochezia, no hematemesis, no melena             GU:                              no dysuria,  no frequency, no urinary tract infection, no hematuria, no kidney stones, no kidney disease             Vascular:                     no pain suggestive of claudication, no pain in feet, no leg cramps, no varicose veins, no DVT, no non-healing foot ulcer             Neuro:                         no stroke, no TIA's, no seizures, no headaches, no temporary blindness one eye,  no slurred speech, + peripheral neuropathy in hands and arm from pinched nerve in neck, no chronic pain, no  instability of gait, no memory/cognitive dysfunction             Musculoskeletal:         + arthritis, no joint swelling, + myalgias, no difficulty walking, normal mobility  Skin:                            no rash, no itching, no skin infections, no pressure sores or ulcerations             Psych:                         + anxiety, no depression, no nervousness, no unusual recent stress             Eyes:                           no blurry vision, no floaters, no recent vision changes, + wears glasses              ENT:                            no hearing loss, no loose or painful teeth, + partial dentures, last saw dentist 3 years ago             Hematologic:               + easy bruising, no abnormal bleeding, no clotting disorder, no frequent epistaxis             Endocrine:                   no diabetes, does not check CBG's at home                            Physical Exam:               BP 127/76   Pulse 71   Resp 20   Ht 5\' 3"  (1.6 m)   Wt 170 lb (77.1 kg)   SpO2 93% Comment: RA  BMI 30.11 kg/m              General:                      Elderly but  well-appearing             HEENT:                       Unremarkable, NCAT, PERLA, EOMI             Neck:                           no JVD, no bruits, no adenopathy              Chest:                          clear to auscultation, symmetrical breath sounds, no wheezes, no rhonchi              CV:                              RRR, 3/6 systolic murmur RSB, no diastolic murmur             Abdomen:  soft, non-tender, no masses              Extremities:                 warm, well-perfused, pulses palpable at ankle, no lower extremity edema             Rectal/GU                   Deferred             Neuro:                         Grossly non-focal and symmetrical throughout             Skin:                            Clean and dry, no rashes, no breakdown   Diagnostic Tests:      ECHOCARDIOGRAM REPORT          Patient Name:   KRITHIKA TOME Date of Exam: 08/25/2020  Medical Rec #:  [id number]        Height:       63.0 in  Accession #:    [id number]       Weight:       165.2 lb  Date of Birth:  1950-04-22         BSA:          1.783 m  Patient Age:    71 years         BP:           122/64 mmHg  Patient Gender: F                HR:           88 bpm.  Exam Location:  [facility]   Procedure: 2D Echo, 3D Echo, Cardiac Doppler, Color Doppler and Strain  Analysis   Indications:    I35 Aortic stenosis     History:        Patient has prior history of Echocardiogram examinations,  most                  recent 07/01/2020. Aortic Valve Disease;  Signs/Symptoms:Murmur.                  COVID-19 06/2020.     Sonographer:    Basilia Jumbo BS, RDCS  Referring Phys: [place]     1. The aortic valve is tricuspid. There is severe calcifcation of the  aortic valve. There is severe thickening of the aortic valve. Aortic valve  regurgitation is mild. Severe aortic valve stenosis. Aortic valve area, by  VTI measures 0.79 cm. Aortic  valve mean gradient measures 46.0 mmHg. Aortic valve Vmax measures 4.20  m/s.   2. The mitral valve appears rheumatic with mild mitral stenosis (MG 4.5  mmHG @ 82 bpm). MVA by PHT 2.3 cm2. The mitral valve is rheumatic. Mild  mitral valve regurgitation. Mild mitral stenosis. The mean mitral valve  gradient is 4.5 mmHg with average  heart rate of 82 bpm.   3. Left ventricular ejection fraction, by estimation, is 55 to 60%. Left  ventricular ejection fraction by 3D volume is 59 %. The left ventricle has  normal function. The left ventricle has no regional wall motion  abnormalities. Left ventricular  diastolic   parameters are consistent with Grade I diastolic dysfunction (impaired  relaxation). The average left ventricular global longitudinal strain is  -20.6 %. The global longitudinal strain is normal.   4. Right ventricular systolic  function is normal. The right ventricular  size is normal. Tricuspid regurgitation signal is inadequate for assessing  PA pressure.   5. The inferior vena cava is normal in size with greater than 50%  respiratory variability, suggesting right atrial pressure of 3 mmHg.   Comparison(s): Report only 07/01/20 EF >55%.   FINDINGS   Left Ventricle: Left ventricular ejection fraction, by estimation, is 55  to 60%. Left ventricular ejection fraction by 3D volume is 59 %. The left  ventricle has normal function. The left ventricle has no regional wall  motion abnormalities. The average  left ventricular global longitudinal strain is -20.6 %. The global  longitudinal strain is normal. The left ventricular internal cavity size  was normal in size. There is no left ventricular hypertrophy. Left  ventricular diastolic parameters are consistent   with Grade I diastolic dysfunction (impaired relaxation).   Right Ventricle: The right ventricular size is normal. No increase in  right ventricular wall thickness. Right ventricular systolic function is  normal. Tricuspid regurgitation signal is inadequate for assessing PA  pressure.   Left Atrium: Left atrial size was normal in size.   Right Atrium: Right atrial size was normal in size.   Pericardium: Trivial pericardial effusion is present.   Mitral Valve: The mitral valve appears rheumatic with mild mitral stenosis  (MG 4.5 mmHG @ 82 bpm). MVA by PHT 2.3 cm2. The mitral valve is rheumatic.  There is mild calcification of the anterior and posterior mitral valve  leaflet(s). Mild mitral valve  regurgitation. Mild mitral valve stenosis. MV peak gradient, 12.2 mmHg.  The mean mitral valve gradient is 4.5 mmHg with average heart rate of 82  bpm.   Tricuspid Valve: The tricuspid valve is grossly normal. Tricuspid valve  regurgitation is trivial. No evidence of tricuspid stenosis.   Aortic Valve: The aortic valve is tricuspid. There is severe  calcifcation  of the aortic valve. There is severe thickening of the aortic valve.  Aortic valve regurgitation is mild. Aortic regurgitation PHT measures 461  msec. Severe aortic stenosis is  present. Aortic valve mean gradient measures 46.0 mmHg. Aortic valve peak  gradient measures 70.6 mmHg. Aortic valve area, by VTI measures 0.79 cm.   Pulmonic Valve: The pulmonic valve was grossly normal. Pulmonic valve  regurgitation is trivial. No evidence of pulmonic stenosis.   Aorta: The aortic root and ascending aorta are structurally normal, with  no evidence of dilitation.   Venous: The inferior vena cava is normal in size with greater than 50%  respiratory variability, suggesting right atrial pressure of 3 mmHg.   IAS/Shunts: The atrial septum is grossly normal.      LEFT VENTRICLE  PLAX 2D  LVIDd:         4.70 cm  LVIDs:         3.10 cm         2D  LV PW:         0.80 cm         Longitudinal  LV IVS:        0.90 cm         Strain  LVOT diam:     2.20 cm         2D Strain GLS  -21.9 %  LV SV:         85              (A2C):  LV SV Index:   48              2D Strain GLS  -22.4 %  LVOT Area:     3.80 cm        (A3C):                                 2D Strain GLS  -17.4 %                                 (A4C):                                 2D Strain GLS  -20.6 %                                 Avg:                                    3D Volume EF                                 LV 3D EF:    Left                                              ventricul                                              ar                                              ejection                                              fraction                                              by 3D                                              volume is  59 %.                                    3D Volume EF:                                 3D EF:        59 %                                  LV EDV:       100 ml                                 LV ESV:       41 ml                                 LV SV:        59 ml   RIGHT VENTRICLE  RV Basal diam:  3.40 cm  RV S prime:     11.70 cm/s  TAPSE (M-mode): 2.1 cm   LEFT ATRIUM             Index       RIGHT ATRIUM           Index  LA diam:        3.20 cm 1.79 cm/m  RA Pressure: 3.00 mmHg  LA Vol (A2C):   46.5 ml 26.08 ml/m RA Area:     10.40 cm  LA Vol (A4C):   30.6 ml 17.16 ml/m RA Volume:   21.90 ml  12.28 ml/m  LA Biplane Vol: 39.5 ml 22.16 ml/m   AORTIC VALVE  AV Area (Vmax):    0.88 cm  AV Area (Vmean):   0.78 cm  AV Area (VTI):     0.79 cm  AV Vmax:           420.00 cm/s  AV Vmean:          302.200 cm/s  AV VTI:            1.080 m  AV Peak Grad:      70.6 mmHg  AV Mean Grad:      46.0 mmHg  LVOT Vmax:         97.50 cm/s  LVOT Vmean:        61.800 cm/s  LVOT VTI:          0.224 m  LVOT/AV VTI ratio: 0.21  AI PHT:            461 msec     AORTA  Ao Root diam: 3.30 cm  Ao Asc diam:  3.30 cm   MITRAL VALVE                TRICUSPID VALVE  MV Area (PHT): 2.43 cm     Estimated RAP:  3.00 mmHg  MV Area VTI:   2.39 cm  MV Peak grad:  12.2 mmHg    SHUNTS  MV Mean grad:  4.5 mmHg     Systemic VTI:  0.22 m  MV Vmax:       1.75 m/s     Systemic  Diam: 2.20 cm  MV Vmean:      102.0 cm/s  MV Decel Time: 312 msec  MV E velocity: 102.00 cm/s  MV A velocity: 150.00 cm/s  MV E/A ratio:  0.68   Eleonore Chiquito MD  Electronically signed by Eleonore Chiquito MD  Signature Date/Time: 08/25/2020/7:21:00 PM         Final       Physicians   Panel Physicians Referring Physician Case Authorizing Physician  Sherren Mocha, MD (Primary)        Procedures   RIGHT/LEFT HEART CATH AND CORONARY ANGIOGRAPHY    Conclusion   1.  Angiographically normal coronary arteries. 2.  Calcification of the aortic valve with restricted leaflet opening, hemodynamic findings consistent with moderately severe aortic stenosis  with peak to peak gradient 29 mmHg, mean gradient 22 mmHg, calculated valve area 1.1 cm 3.  Normal right heart pressures   Recommend: Valve team discussion.  Echo parameters suggestive of severe aortic stenosis in the symptomatic patient.  Favor moving forward with TAVR evaluation.   Procedural Details   Technical Details INDICATION: Severe aortic stenosis  PROCEDURAL DETAILS: There was an indwelling IV in a right antecubital vein. Using normal sterile technique, the IV was changed out for a 5 Fr brachial sheath over a 0.018 inch wire. The right wrist was then prepped, draped, and anesthetized with 1% lidocaine. Using the modified Seldinger technique a 5/6 French Slender sheath was placed in the right radial artery. Intra-arterial verapamil was administered through the radial artery sheath. IV heparin was administered after a JR4 catheter was advanced into the central aorta. A Swan-Ganz catheter was used for the right heart catheterization. Standard protocol was followed for recording of right heart pressures and sampling of oxygen saturations. Fick cardiac output was calculated. Standard Judkins catheters were used for selective coronary angiography. LV pressure is recorded and an aortic valve pullback is performed. The aortic valve is crossed with a JR4 catheter and J wire. There were no immediate procedural complications. The patient was transferred to the post catheterization recovery area for further monitoring.      Estimated blood loss <50 mL.   During this procedure medications were administered to achieve and maintain moderate conscious sedation while the patient's heart rate, blood pressure, and oxygen saturation were continuously monitored and I was present face-to-face 100% of this time.    Medications (Filter: Administrations occurring from 1008 to 1053 on 09/05/20)  important  Continuous medications are totaled by the amount administered until 09/05/20 1053.    midazolam (VERSED)  injection (mg) Total dose:  1 mg Date/Time Rate/Dose/Volume Action    09/05/20 1022 1 mg Given      fentaNYL (SUBLIMAZE) injection (mcg) Total dose:  25 mcg Date/Time Rate/Dose/Volume Action    09/05/20 1022 25 mcg Given      lidocaine (PF) (XYLOCAINE) 1 % injection (mL) Total volume:  4 mL Date/Time Rate/Dose/Volume Action    09/05/20 1028 2 mL Given    1029 2 mL Given      Heparin (Porcine) in NaCl 1000-0.9 UT/500ML-% SOLN (mL) Total volume:  1,000 mL Date/Time Rate/Dose/Volume Action    09/05/20 1029 500 mL Given    1029 500 mL Given      Radial Cocktail/Verapamil only (mL) Total volume:  10 mL Date/Time Rate/Dose/Volume Action    09/05/20 1038 10 mL Given      heparin sodium (porcine) injection (Units) Total dose:  4,000 Units Date/Time Rate/Dose/Volume Action    09/05/20 1040 4,000  Units Given      iohexol (OMNIPAQUE) 350 MG/ML injection (mL) Total volume:  30 mL Date/Time Rate/Dose/Volume Action    09/05/20 1049 30 mL Given      Sedation Time   Sedation Time Physician-1: 24 minutes 48 seconds Contrast   Medication Name Total Dose  iohexol (OMNIPAQUE) 350 MG/ML injection 30 mL    Radiation/Fluoro   Fluoro time: 2.6 (min) DAP: 6469 (mGycm2) Cumulative Air Kerma: 114 (mGy) Coronary Findings   Diagnostic Dominance: Right Left Main  Vessel is angiographically normal.  Left Anterior Descending  The LAD is angiographically normal. The midportion has some tortuosity. The vessel has no significant stenosis throughout and it reaches the LV apex.  Left Circumflex  The circumflex is angiographically normal with no significant stenosis.  Right Coronary Artery  Vessel was injected. Vessel is normal in caliber. Vessel is angiographically normal. There is a mild fold in the proximal RCA with no associated stenosis  Intervention   No interventions have been documented. Left Heart   Aortic Valve The aortic valve is calcified. There is restricted aortic valve  motion.    Coronary Diagrams   Diagnostic Dominance: Right Intervention   Implants      No implant documentation for this case.    Syngo Images    Show images for CARDIAC CATHETERIZATION Images on Long Term Storage    Show images for Alica, [place] to Procedure Log   Procedure Log    Hemo Data   Flowsheet Row Most Recent Value  Fick Cardiac Output 5.41 L/min  Fick Cardiac Output Index 3.03 (L/min)/BSA  Aortic Mean Gradient 22 mmHg  Aortic Peak Gradient 29 mmHg  Aortic Valve Area 1.13  Aortic Value Area Index 0.63 cm2/BSA  RA A Wave 3 mmHg  RA V Wave 1 mmHg  RA Mean 0 mmHg  RV Systolic Pressure 25 mmHg  RV Diastolic Pressure 0 mmHg  RV EDP 4 mmHg  PA Systolic Pressure 23 mmHg  PA Diastolic Pressure 6 mmHg  PA Mean 13 mmHg  PW A Wave 11 mmHg  PW V Wave 11 mmHg  PW Mean 5 mmHg  AO Systolic Pressure 277 mmHg  AO Diastolic Pressure 61 mmHg  AO Mean 86 mmHg  LV Systolic Pressure 412 mmHg  LV Diastolic Pressure 4 mmHg  LV EDP 9 mmHg  AOp Systolic Pressure 878 mmHg  AOp Diastolic Pressure 59 mmHg  AOp Mean Pressure 86 mmHg  LVp Systolic Pressure 676 mmHg  LVp Diastolic Pressure 2 mmHg  LVp EDP Pressure 7 mmHg  QP/QS 1  TPVR Index 4.29 HRUI  TSVR Index 28.35 HRUI  TPVR/TSVR Ratio 0.15      ADDENDUM REPORT: 09/19/2020 18:05   CLINICAL DATA:  Severe Aortic Stenosis.   EXAM: Cardiac TAVR CT   TECHNIQUE: A non-contrast, gated CT scan was obtained with axial slices of 3 mm through the heart for aortic valve calcium scoring. A 110 kV retrospective, gated, contrast cardiac scan was obtained. Gantry rotation speed was 250 msecs and collimation was 0.6 mm. Nitroglycerin was not given. The 3D data set was reconstructed in 5% intervals of the 0-95% of the R-R cycle. Systolic and diastolic phases were analyzed on a dedicated workstation using MPR, MIP, and VRT modes. The patient received 100 cc of contrast.   FINDINGS: Image quality: Excellent.    Noise artifact is: Limited.   Valve Morphology: The aortic valve is tricuspid with severe calcifications and severe thickening. There is restricted movement in systole. The  [place] and RCC are diffusely calcified.   Aortic Valve Calcium score: 819   Aortic annular dimension:   Phase assessed: 30%   Annular area: 454 mm2   Annular perimeter: 77.0 mm   Max diameter: 27.0 mm   Min diameter: 22.5 mm   Annular and subannular calcification: None.   Optimal coplanar projection: RAO 5 CAU 2   Coronary Artery Height above Annulus:   Left Main: 10.7 mm   Right Coronary: 12.9 mm   Sinus of Valsalva Measurements:   Non-coronary: 25 mm   Right-coronary: 26 mm   Left-coronary: 26 mm   Average diameter: 26.5 mm   Sinus of Valsalva Height:   Non-coronary: 21.3 mm   Right-coronary: 18.5 mm   Left-coronary: 16.7 mm   Sinotubular Junction: 26 mm   Ascending Thoracic Aorta: 30 mm   Coronary Arteries: Normal coronary origin. Right dominance. The study was performed without use of NTG and is insufficient for plaque evaluation. Please refer to recent cardiac catheterization for coronary assessment.   Cardiac Morphology:   Right Atrium: Right atrial size is within normal limits.   Right Ventricle: The right ventricular cavity is within normal limits.   Left Atrium: Left atrial size is normal in size with no left atrial appendage filling defect. A small PFO is present.   Left Ventricle: The ventricular cavity size is within normal limits. There are no stigmata of prior infarction. There is no abnormal filling defect. Normal left ventricular function, EF=64%. No regional wall motion abnormalities.   Pulmonary arteries: Normal in size without proximal filling defect.   Pulmonary veins: Normal pulmonary venous drainage.   Pericardium: Normal thickness with no significant effusion or calcium present.   Mitral Valve: The mitral valve appears rheumatic with  mild calcification.   Extra-cardiac findings: See attached radiology report for non-cardiac structures.   IMPRESSION: 1. Tricuspid aortic valve with severe diffuse calcifications.   2. Annular measurements appropriate for 26 mm S3 (454 mm2).   3. No significant annular or subannular calcifications.   4. Shallow left main height (10.7 mm).   5. Optimal Fluoroscopic Angle for Delivery: RAO 5 CAU 2   6. Small PFO.   [place]ells T. Audie Box, MD     Electronically Signed   By: Eleonore Chiquito M.D.   On: 09/19/2020 18:05   Narrative & Impression  CLINICAL DATA:  Aortic valve stenosis. TAVR pre-intervention planning.   EXAM: CT ANGIOGRAPHY CHEST, ABDOMEN AND PELVIS   TECHNIQUE: Multidetector CT imaging through the chest, abdomen and pelvis was performed using the standard protocol during bolus administration of intravenous contrast. Multiplanar reconstructed images and MIPs were obtained and reviewed to evaluate the vascular anatomy.   CONTRAST:  15mL OMNIPAQUE IOHEXOL 350 MG/ML SOLN   COMPARISON:  07/15/2020 chest radiograph. 11/13/2016 CT abdomen/pelvis.   FINDINGS: CTA CHEST FINDINGS   Cardiovascular: Normal heart size. No significant pericardial effusion/thickening. Diffuse thickening and coarse calcification of the aortic valve. Great vessels are normal in course and caliber. No central pulmonary emboli.   Mediastinum/Nodes: No discrete thyroid nodules. Unremarkable esophagus. No pathologically enlarged axillary, mediastinal or hilar lymph nodes.   Lungs/Pleura: No pneumothorax. No pleural effusion. No acute consolidative airspace disease, lung masses or significant pulmonary nodules.   Musculoskeletal: No aggressive appearing focal osseous lesions. Soft tissue anchors in the right humeral head. Mild thoracic spondylosis.   CTA ABDOMEN AND PELVIS FINDINGS   Hepatobiliary: Normal liver size. Subcentimeter hypodense anterior right liver lesion is too small to  characterize and unchanged,  considered benign. No new liver lesions. Normal gallbladder with no radiopaque cholelithiasis. No biliary ductal dilatation.   Pancreas: Normal, with no mass or duct dilation.   Spleen: Normal size. No mass.   Adrenals/Urinary Tract: Normal adrenals. Exophytic homogeneous 3.0 cm hypodense renal cortical lesion in the posterior lower left kidney with density 26 HU (series 3/image 366), stable since 11/13/2016, compatible with a benign Bosniak category 2 renal cyst. Additional subcentimeter hypodense anterior lower right renal cortical lesion is too small to characterize and is unchanged, considered benign. No new contour deforming renal lesions. No hydronephrosis. Normal bladder.   Stomach/Bowel: Normal non-distended stomach. Normal caliber small bowel with no small bowel wall thickening. Normal appendix. Normal large bowel with no diverticulosis, large bowel wall thickening or pericolonic fat stranding.   Vascular/Lymphatic: Mildly atherosclerotic nonaneurysmal abdominal aorta. No pathologically enlarged lymph nodes in the abdomen or pelvis.   Reproductive: Status post hysterectomy, with no abnormal findings at the vaginal cuff. No adnexal mass.   Other: No pneumoperitoneum, ascites or focal fluid collection.   Musculoskeletal: No aggressive appearing focal osseous lesions. Moderate lumbar degenerative disc disease, most prominent at L1-2.   VASCULAR MEASUREMENTS PERTINENT TO TAVR:   AORTA:   Minimal Aortic Diameter-15.1 x 13.1 mm   Severity of Aortic Calcification-mild   RIGHT PELVIS:   Right Common Iliac Artery -   Minimal Diameter-7.9 x 7.4 mm   Tortuosity-mild   Calcification-mild   Right External Iliac Artery -   Minimal Diameter-4.7 x 4.6 mm   Tortuosity-mild   Calcification-none   Right Common Femoral Artery -   Minimal Diameter-5.3 x 5.1 mm   Tortuosity-mild   Calcification-none   LEFT PELVIS:   Left Common  Iliac Artery -   Minimal Diameter-7.9 x 7.7 mm   Tortuosity-mild   Calcification-none   Left External Iliac Artery -   Minimal Diameter-4.6 x 4.4 mm   Tortuosity-mild   Calcification-none   Left Common Femoral Artery -   Minimal Diameter-5.2 x 4.8 mm   Tortuosity-mild   Calcification-none   Review of the MIP images confirms the above findings.   IMPRESSION: 1. Vascular findings and measurements pertinent to potential TAVR procedure, as detailed. Note is made of somewhat diminutive external iliac and common femoral arteries bilaterally. 2. Diffuse thickening and coarse calcification of the aortic valve, compatible with the reported history of aortic stenosis. 3. Aortic Atherosclerosis (ICD10-I70.0).     Electronically Signed   By: Ilona Sorrel M.D.   On: 09/19/2020 13:22      STS RISK CALCULATOR: Isolated AVR: Risk of Mortality: 1.543% Renal Failure: 0.711% Permanent Stroke: 1.097% Prolonged Ventilation: 4.669% DSW Infection: 0.060% Reoperation: 2.825% Morbidity or Mortality: 8.090% Short Length of Stay: 51.519% Long Length of Stay: 2.829%     Impression:   This 70 year old woman has stage D, severe, symptomatic aortic stenosis with New York Heart Association class II symptoms of exertional fatigue and shortness of breath as well as chest discomfort consistent with chronic diastolic congestive heart failure.  I have personally reviewed her 2D echocardiogram, cardiac catheterization, and CTA studies.  Her echocardiogram shows a severely calcified trileaflet aortic valve with severe leaflet thickening and restricted mobility.  The mean gradient is 46 mmHg with a valve area of 0.79 cm consistent with severe aortic stenosis.  There is mild aortic insufficiency.  The mitral valve appears rheumatic with mild calcification of the anterior and posterior leaflets with a mean gradient 4.5 mmHg and a peak gradient of 12 mmHg consistent with mild  mitral valve  stenosis.  There is mild mitral valve regurgitation.  Left ventricular ejection fraction was 55 to 60%.  I agree that aortic valve replacement is indicated in this patient for relief of her symptoms and to prevent progressive left ventricular deterioration.  She is a low risk patient but 70 years old with mild limitation due to osteoarthritis and I think transcatheter aortic valve replacement would be a reasonable alternative to open surgical aortic valve replacement.  Her gated cardiac CTA shows anatomy suitable for transcatheter aortic valve placement using a SAPIEN 3 valve.  Her abdominal and pelvic CTA shows marginal size of the common femoral and external iliac arteries bilaterally.  There is no significant disease in them so I think it may be possible to pass a 14 [place] sheath.  Her subclavian and carotid arteries were also small.  I think transaortic or transapical insertion would be options if needed. The patient was counseled at length regarding treatment alternatives for management of severe symptomatic aortic stenosis. The risks and benefits of surgical intervention has been discussed in detail. Long-term prognosis with medical therapy was discussed. Alternative approaches such as conventional surgical aortic valve replacement, transcatheter aortic valve replacement, and palliative medical therapy were compared and contrasted at length. This discussion was placed in the context of the patient's own specific clinical presentation and past medical history. All of her questions have been addressed.    Following the decision to proceed with transcatheter aortic valve replacement, a discussion was held regarding what types of management strategies would be attempted intraoperatively in the event of life-threatening complications, including whether or not the patient would be considered a candidate for the use of cardiopulmonary bypass and/or conversion to open sternotomy for attempted surgical  intervention.  She is a low surgical risk patient and I think she would be a candidate for emergent sternotomy to manage any intraoperative complications.  The patient is aware of the fact that transient use of cardiopulmonary bypass may be necessary. The patient has been advised of a variety of complications that might develop including but not limited to risks of death, stroke, paravalvular leak, aortic dissection or other major vascular complications, aortic annulus rupture, device embolization, cardiac rupture or perforation, mitral regurgitation, acute myocardial infarction, arrhythmia, heart block or bradycardia requiring permanent pacemaker placement, congestive heart failure, respiratory failure, renal failure, pneumonia, infection, other late complications related to structural valve deterioration or migration, or other complications that might ultimately cause a temporary or permanent loss of functional independence or other long term morbidity. The patient provides full informed consent for the procedure as described and all questions were answered.       Plan:   Transfemoral transcatheter aortic valve replacement with backup being transaortic or transapical insertion.      Gaye Pollack, MD

## 2020-11-01 NOTE — Anesthesia Postprocedure Evaluation (Signed)
Anesthesia Post Note  Patient: Grace Thomas  Procedure(s) Performed: TRANSCATHETER AORTIC VALVE REPLACEMENT, TRANSFEMORAL (Chest) TRANSESOPHAGEAL ECHOCARDIOGRAM (TEE) (Chest)     Patient location during evaluation: PACU Anesthesia Type: General Level of consciousness: awake and alert Pain management: pain level controlled Vital Signs Assessment: post-procedure vital signs reviewed and stable Respiratory status: spontaneous breathing, nonlabored ventilation and respiratory function stable Cardiovascular status: blood pressure returned to baseline and stable Postop Assessment: no apparent nausea or vomiting Anesthetic complications: no   No notable events documented.  Last Vitals:  Vitals:   11/01/20 0955 11/01/20 1008  BP: (!) 129/57   Pulse: 80   Resp: 15   Temp:  (!) 36.2 C  SpO2: 100%     Last Pain:  Vitals:   11/01/20 0938  TempSrc: Temporal  PainSc: 0-No pain                 Catalina Gravel

## 2020-11-01 NOTE — Progress Notes (Signed)
Mobility Specialist: Progress Note   11/01/20 1530  Mobility  Activity Ambulated in hall  Level of Assistance Independent  Assistive Device None  Distance Ambulated (ft) 470 ft  Mobility Ambulated independently in hallway  Mobility Response Tolerated well  Mobility performed by Mobility specialist  Bed Position Chair  $Mobility charge 1 Mobility   Pre-Mobility: 81 HR, 118/63 BP, 96% SpO2 Post-Mobility: 87 HR, 122/73 BP, 97% SpO2  Pt to BR and then agreeable to ambulation. Pt had no c/o during ambulation. No new drainage noted at groin sites, RN present in the room. Pt to recliner with call bell and phone in reach.   [facility] Monae Topping Mobility Specialist Mobility Specialist Phone: [phone]

## 2020-11-01 NOTE — Progress Notes (Signed)
  Echocardiogram 2D Echocardiogram has been performed.  Grace Thomas 11/01/2020, 9:44 AM

## 2020-11-01 NOTE — Progress Notes (Signed)
Pt arrived from cath lab, VSS, CHG complete, Telebox [id number], orders released, groins level 0 no bleeding. Oriented to unit, call light within reach.   Chrisandra Carota, RN 11/01/2020 11:37 AM

## 2020-11-01 NOTE — Anesthesia Procedure Notes (Signed)
Procedure Name: Intubation Date/Time: 11/01/2020 7:42 AM Performed by: Ardyth Harps, CRNA Pre-anesthesia Checklist: Patient identified, Emergency Drugs available, Suction available and Patient being monitored Patient Re-evaluated:Patient Re-evaluated prior to induction Oxygen Delivery Method: Circle System Utilized Preoxygenation: Pre-oxygenation with 100% oxygen Induction Type: IV induction Ventilation: Mask ventilation without difficulty Laryngoscope Size: Mac and 3 Grade View: Grade I Tube type: Oral Tube size: 7.5 mm Number of attempts: 1 Airway Equipment and Method: Stylet and Oral airway Placement Confirmation: ETT inserted through vocal cords under direct vision, positive ETCO2 and breath sounds checked- equal and bilateral Secured at: 21 cm Tube secured with: Tape Dental Injury: Teeth and Oropharynx as per pre-operative assessment

## 2020-11-02 ENCOUNTER — Encounter (HOSPITAL_COMMUNITY): Payer: Self-pay | Admitting: Cardiovascular Disease

## 2020-11-02 ENCOUNTER — Inpatient Hospital Stay (HOSPITAL_COMMUNITY): Payer: Medicare Other

## 2020-11-02 ENCOUNTER — Other Ambulatory Visit (HOSPITAL_COMMUNITY): Payer: Self-pay

## 2020-11-02 DIAGNOSIS — Z952 Presence of prosthetic heart valve: Secondary | ICD-10-CM

## 2020-11-02 DIAGNOSIS — I350 Nonrheumatic aortic (valve) stenosis: Secondary | ICD-10-CM | POA: Diagnosis not present

## 2020-11-02 DIAGNOSIS — Z8616 Personal history of COVID-19: Secondary | ICD-10-CM | POA: Diagnosis not present

## 2020-11-02 DIAGNOSIS — Z006 Encounter for examination for normal comparison and control in clinical research program: Secondary | ICD-10-CM | POA: Diagnosis not present

## 2020-11-02 DIAGNOSIS — F419 Anxiety disorder, unspecified: Secondary | ICD-10-CM | POA: Diagnosis not present

## 2020-11-02 LAB — CBC
HCT: 36 % (ref 36.0–46.0)
Hemoglobin: 11.8 g/dL — ABNORMAL LOW (ref 12.0–15.0)
MCH: 31.1 pg (ref 26.0–34.0)
MCHC: 32.8 g/dL (ref 30.0–36.0)
MCV: 95 fL (ref 80.0–100.0)
Platelets: 217 10*3/uL (ref 150–400)
RBC: 3.79 MIL/uL — ABNORMAL LOW (ref 3.87–5.11)
RDW: 11.5 % (ref 11.5–15.5)
WBC: 13.9 10*3/uL — ABNORMAL HIGH (ref 4.0–10.5)
nRBC: 0 % (ref 0.0–0.2)

## 2020-11-02 LAB — ECHOCARDIOGRAM COMPLETE
AR max vel: 1.56 cm2
AV Area VTI: 1.58 cm2
AV Area mean vel: 1.71 cm2
AV Mean grad: 14 mmHg
AV Peak grad: 24.4 mmHg
Ao pk vel: 2.47 m/s
Area-P 1/2: 2.43 cm2
Height: 63 in
S' Lateral: 3 cm
Weight: 2649.6 oz

## 2020-11-02 LAB — BASIC METABOLIC PANEL
Anion gap: 6 (ref 5–15)
BUN: 11 mg/dL (ref 8–23)
CO2: 27 mmol/L (ref 22–32)
Calcium: 8.5 mg/dL — ABNORMAL LOW (ref 8.9–10.3)
Chloride: 104 mmol/L (ref 98–111)
Creatinine, Ser: 0.79 mg/dL (ref 0.44–1.00)
GFR, Estimated: >60 mL/min (ref >60)
Glucose, Bld: 127 mg/dL — ABNORMAL HIGH (ref 70–99)
Potassium: 3.8 mmol/L (ref 3.5–5.1)
Sodium: 137 mmol/L (ref 135–145)

## 2020-11-02 MED ORDER — PANTOPRAZOLE SODIUM 40 MG PO TBEC
40.0000 mg | DELAYED_RELEASE_TABLET | Freq: Every day | ORAL | 3 refills | Status: DC
  Filled 2020-11-02: qty 60, 60d supply, fill #0

## 2020-11-02 MED ORDER — CLOPIDOGREL BISULFATE 75 MG PO TABS
75.0000 mg | ORAL_TABLET | Freq: Every day | ORAL | 1 refills | Status: DC
  Filled 2020-11-02: qty 90, 90d supply, fill #0

## 2020-11-02 MED ORDER — ASPIRIN 81 MG PO CHEW: 81.0000 mg | CHEWABLE_TABLET | Freq: Every day | ORAL | Status: DC

## 2020-11-02 NOTE — Progress Notes (Signed)
Mobility Specialist: Progress Note   11/02/20 1048  Mobility  Activity Ambulated in hall  Level of Assistance Independent  Assistive Device None  Distance Ambulated (ft) 940 ft  Mobility Ambulated independently in hallway  Mobility Response Tolerated well  Mobility performed by Mobility specialist  $Mobility charge 1 Mobility   Post-Mobility: 81 HR  Pt asymptomatic during ambulation. Pt sitting EOB after walk with call bell and phone in reach.   [facility] Elysa Womac Mobility Specialist Mobility Specialist Phone: [phone]

## 2020-11-02 NOTE — Progress Notes (Signed)
Pt walking independently. No c/o. Discussed restrictions, exercise, diet, and CRPII. Will refer to [place].  [id number] Yves Dill CES, ACSM 11:06 AM 11/02/2020

## 2020-11-02 NOTE — Progress Notes (Signed)
Nursing dc note  Patient alert and oriented. Verbalized understanding of dc intructions. Toc meds and belongings given to patient. Ccmd made aware of dc order. Patient going home with daughter via car. Will transport to the main entrance.

## 2020-11-02 NOTE — Progress Notes (Signed)
  Echocardiogram 2D Echocardiogram has been performed.  Grace Thomas 11/02/2020, 9:19 AM

## 2020-11-02 NOTE — Discharge Summary (Addendum)
[place] VALVE TEAM  Discharge Summary    Patient ID: Grace Thomas MRN: [id number]; DOB: 1950-02-27  Admit date: 11/01/2020 Discharge date: 11/02/2020  Primary Care Provider: Neale Burly, MD  Primary Cardiologist: Rozann Lesches, MD /Dr. Burt Knack, MD/ Dr. Ali Lowe, MD, Dr. Cyndia Bent, MD (TAVR)  Discharge Diagnoses    Principal Problem:   S/P TAVR (transcatheter aortic valve replacement) Active Problems:   Anxiety   GERD (gastroesophageal reflux disease)   Severe aortic stenosis  Allergies No Known Allergies  Diagnostic Studies/Procedures    TAVR OPERATIVE NOTE     Date of Procedure:                11/01/2020   Preoperative Diagnosis:      Severe Aortic Stenosis    Postoperative Diagnosis:    Same    Procedure:        Transcatheter Aortic Valve Replacement - Percutaneous Right Transfemoral Approach             Edwards Sapien 3 Ultra RSLTHV (size 23 mm, model # [id number] serial # [id number])              Co-Surgeons:                        Gaye Pollack, MD and Sherren Mocha, MD  and Lenna Sciara, MD     Anesthesiologist:                  Hoy Morn, MD   Echocardiographer:              Marry Guan, MD   Pre-operative Echo Findings: Severe aortic stenosis Normal left ventricular systolic function   Post-operative Echo Findings: Trace paravalvular leak Normal left ventricular systolic function _____________   Echo 11/02/20: Completed but pending formal read at the time of discharge.   History of Present Illness     Grace Thomas is a 70 y.o. female with a history of GERD, anxiety and severe aortic stenosis who presented to [facility] on 11/01/20 for planned TAVR.   Hospital Course     [name] was recently seen in consultation by Dr. Domenic Polite after an echocardiogram at her primary care office demonstrated severe aortic stenosis. This reportedly showed an LVEF greater than 55% with mild mitral regurgitation,  mild aortic regurgitation, and severe calcific aortic stenosis with a mean transvalvular gradient of 38 mmHg and dimensionless index of 0.19. Given this, she was referred to Dr. Burt Knack for further evaluation and possible TAVR. At the time of consultation, she was having symptoms of chest heaviness with physical activity such as walking. She described it as an 'uncomfortable' feeling but not a pain. Additionally, she was having shortness of breath with moderate level activities and increased fatigue, and mild lightheadedness at times.    She is married and has two daughters who both live locally. She is retired from working in [employer] and also worked in a retirement home.The patient has had regular dental care and reports no problems with her teeth.   She underwent R/LHC which showed angiographically normal coronary arteries. Calcification of the aortic valve with restricted leaflet opening, hemodynamic findings consistent with moderately severe aortic stenosis with peak to peak gradient 29 mmHg, mean gradient 22 mmHg, calculated valve area 1.1 cm. Normal right heart pressures.   The patient was evaluated by the multidisciplinary valve team and felt to have severe, symptomatic aortic stenosis and  to be a suitable candidate for TAVR, which was set up for 11/01/20.    Severe AS: s/p successful TAVR with a 23 mm Edwards Sapien 3 Ultra THV via the TF approach on 11/01/20. Post operative echo performed and reviewed by Dr. Burt Knack who reports the valve is stable with a reduced gradient to 12-53mmHg. Groin sites are stable. ECG with NSR and no high grade heart block. She was started on Asprin and Plavix. She has walked with cardiac rehabilitation prior to discharge without issues. Post TAVR care reviewed and will be placed on her AVS. Creatinine 0.79. Hb 11.8 today   Incidental findings: Sub-centimeter hypodense anterior right liver lesion is too small to characterize and unchanged, considered benign. Exophytic  homogeneous 3.0cm hypodense renal cortical lesion in the posterior lower left kidney with density>>stable since11/06/2016, compatible with a benign Bosniak category 2 renal cyst. Additional sub-centimeter hypodense anterior lower right renal cortical lesion is too small to characterize and is unchanged, considered benign  Consultants: None    The patient has been seen and examined by Dr. Burt Knack who feels that she is stable and ready for discharge today, 11/02/20. _____________  Discharge Vitals Blood pressure (!) 105/55, pulse 87, temperature 98 F (36.7 C), temperature source Oral, resp. rate 18, height 5\' 3"  (1.6 m), weight 75.1 kg, SpO2 96 %.  Filed Weights   11/01/20 0552 11/02/20 0207  Weight: 75.8 kg 75.1 kg   General: Well developed, well nourished, NAD Neck: No JVD Lungs:Clear to ausculation bilaterally. Cardiovascular: RRR with S1 S2. Soft murmur Extremities: No edema. Neuro: Alert and oriented. No focal deficits. No facial asymmetry. MAE spontaneously. Psych: Responds to questions appropriately with normal affect.    Labs & Radiologic Studies    CBC Recent Labs    11/01/20 0953 11/02/20 0151  WBC  --  13.9*  HGB 12.6 11.8*  HCT 37.0 36.0  MCV  --  95.0  PLT  --  956   Basic Metabolic Panel Recent Labs    11/01/20 0953 11/02/20 0151  NA 140 137  K 3.7 3.8  CL 103 104  CO2  --  27  GLUCOSE 111* 127*  BUN 13 11  CREATININE 0.60 0.79  CALCIUM  --  8.5*   Liver Function Tests No results for input(s): AST, ALT, ALKPHOS, BILITOT, PROT, ALBUMIN in the last 72 hours. No results for input(s): LIPASE, AMYLASE in the last 72 hours. Cardiac Enzymes No results for input(s): CKTOTAL, CKMB, CKMBINDEX, TROPONINI in the last 72 hours. BNP Invalid input(s): POCBNP D-Dimer No results for input(s): DDIMER in the last 72 hours. Hemoglobin A1C No results for input(s): HGBA1C in the last 72 hours. Fasting Lipid Panel No results for input(s): CHOL, HDL, LDLCALC, TRIG,  CHOLHDL, LDLDIRECT in the last 72 hours. Thyroid Function Tests No results for input(s): TSH, T4TOTAL, T3FREE, THYROIDAB in the last 72 hours.  Invalid input(s): FREET3 _____________  DG Chest 2 View  Result Date: 10/28/2020 CLINICAL DATA:  70 year old female with preoperative chest x-ray EXAM: CHEST - 2 VIEW COMPARISON:  07/15/2020 FINDINGS: Cardiomediastinal silhouette unchanged in size and contour. No evidence of central vascular congestion. No interlobular septal thickening. No pneumothorax or pleural effusion. Coarsened interstitial markings, with no confluent airspace disease. No acute displaced fracture. Degenerative changes of the spine. IMPRESSION: No active cardiopulmonary disease. Electronically Signed   By: Corrie Mckusick D.O.   On: 10/28/2020 15:29   ECHO TEE  Result Date: 11/01/2020    TRANSESOPHOGEAL ECHO REPORT   Patient Name:  [name] Date of Exam: 11/01/2020 Medical Rec #:  [id number]        Height:       63.0 in Accession #:    [id number]       Weight:       167.0 lb Date of Birth:  Oct 05, 1950         BSA:          1.791 m Patient Age:    72 years         BP:           124/83 mmHg Patient Gender: F                HR:           74 bpm. Exam Location:  Inpatient Procedure: 3D Echo, Transesophageal Echo, Cardiac Doppler and Color Doppler Indications:     I35.0 Nonrheumatic aortic (valve) stenosis  History:         Patient has prior history of Echocardiogram examinations, most                  recent 08/25/2020. Aortic Valve Disease. Severe aortic stenosis.  Sonographer:     Roseanna Rainbow RDCS Referring Phys:  [place] Diagnosing Phys: Eleonore Chiquito MD  Sonographer Comments: TAVR procedure. PROCEDURE: After discussion of the risks and benefits of a TEE, an informed consent was obtained from the patient. The transesophogeal probe was passed without difficulty through the esophogus of the patient. Imaged were obtained with the patient in a supine position. Sedation performed  by different physician. The patient was monitored while under deep sedation. Image quality was excellent. The patient's vital signs; including heart rate, blood pressure, and oxygen saturation; remained stable throughout the procedure. The patient developed no complications during the procedure. IMPRESSIONS  1. TEE guided TAVR. 23 mm S3 deployed in appropriate position with no immediate complications. Pre deployment Vmax 3.2 m/s, MG 26 mmHG, EOA 1.05 cm2, DI 0.28, consistent with severe AS. Post deployment Vmax 1.7 m/s, MG 5 mmHG, EOA 2.41 cm2, DI 0.63. Trivial paravalvular leak noted. Valve was well seated and functioning normally. The aortic valve has been repaired/replaced. Aortic valve regurgitation is trivial. Procedure Date: 11/01/2020.  2. Left ventricular ejection fraction, by estimation, is 60 to 65%. The left ventricle has normal function. The left ventricle has no regional wall motion abnormalities.  3. Right ventricular systolic function is normal. The right ventricular size is normal.  4. No left atrial/left atrial appendage thrombus was detected.  5. The mitral valve is rheumatic. Mild mitral valve regurgitation. No evidence of mitral stenosis. The mean mitral valve gradient is 2.0 mmHg with average heart rate of 69 bpm.  6. There is mild (Grade II) layered plaque involving the descending aorta and transverse aorta. FINDINGS  Left Ventricle: Left ventricular ejection fraction, by estimation, is 60 to 65%. The left ventricle has normal function. The left ventricle has no regional wall motion abnormalities. The left ventricular internal cavity size was normal in size. Right Ventricle: The right ventricular size is normal. No increase in right ventricular wall thickness. Right ventricular systolic function is normal. Left Atrium: Left atrial size was normal in size. No left atrial/left atrial appendage thrombus was detected. Right Atrium: Right atrial size was normal in size. Pericardium: There is no  evidence of pericardial effusion. Mitral Valve: The mitral valve is rheumatic. Mild mitral valve regurgitation. No evidence of mitral valve stenosis. MV peak gradient, 3.5 mmHg. The mean mitral  valve gradient is 2.0 mmHg with average heart rate of 69 bpm. Tricuspid Valve: The tricuspid valve is grossly normal. Tricuspid valve regurgitation is trivial. No evidence of tricuspid stenosis. Aortic Valve: TEE guided TAVR. 23 mm S3 deployed in appropriate position with no immediate complications. Pre deployment Vmax 3.2 m/s, MG 26 mmHG, EOA 1.05 cm2, DI 0.28, consistent with severe AS. Post deployment Vmax 1.7 m/s, MG 5 mmHG, EOA 2.41 cm2, DI  0.63. Trivial paravalvular leak noted. Valve was well seated and functioning normally. The aortic valve has been repaired/replaced. Aortic valve regurgitation is trivial. Aortic valve mean gradient measures 5.0 mmHg. Aortic valve peak gradient measures 12.0 mmHg. Aortic valve area, by VTI measures 2.41 cm. There is a 23 mm Sapien prosthetic, stented (TAVR) valve present in the aortic position. Pulmonic Valve: The pulmonic valve was grossly normal. Pulmonic valve regurgitation is trivial. No evidence of pulmonic stenosis. Aorta: The aortic root and ascending aorta are structurally normal, with no evidence of dilitation. There is mild (Grade II) layered plaque involving the descending aorta and transverse aorta. Venous: The left upper pulmonary vein, left lower pulmonary vein, right lower pulmonary vein and right upper pulmonary vein are normal. IAS/Shunts: The atrial septum is grossly normal.  LEFT VENTRICLE PLAX 2D LVOT diam:     2.20 cm LV SV:         85 LV SV Index:   47 LVOT Area:     3.80 cm  AORTIC VALVE AV Area (Vmax):    1.92 cm AV Area (Vmean):   1.51 cm AV Area (VTI):     2.41 cm AV Vmax:           173.00 cm/s AV Vmean:          150.560 cm/s AV VTI:            0.352 m AV Peak Grad:      12.0 mmHg AV Mean Grad:      5.0 mmHg LVOT Vmax:         87.20 cm/s LVOT Vmean:         59.900 cm/s LVOT VTI:          0.223 m LVOT/AV VTI ratio: 0.63  AORTA Ao Root diam: 2.30 cm Ao Asc diam:  2.97 cm MITRAL VALVE MV Peak grad: 3.5 mmHg  SHUNTS MV Mean grad: 2.0 mmHg  Systemic VTI:  0.22 m MV Vmax:      0.93 m/s  Systemic Diam: 2.20 cm MV Vmean:     59.9 cm/s Eleonore Chiquito MD Electronically signed by Eleonore Chiquito MD Signature Date/Time: 11/01/2020/9:59:19 AM    Final    Structural Heart Procedure  Result Date: 11/01/2020 See surgical note for result.  Disposition   Pt is being discharged home today in good condition.  Follow-up Plans & Appointments    Follow-up Information     Eileen Stanford, PA-C. Go on 11/09/2020.   Specialties: Cardiology, Radiology Why: at 2:30pm. Please arrive at 2:15pm Contact information: [place] [id number] [phone]                Discharge Instructions     Amb Referral to Cardiac Rehabilitation   Complete by: As directed    Diagnosis: Valve Replacement   Valve: Aortic Comment - TAVR   After initial evaluation and assessments completed: Virtual Based Care may be provided alone or in conjunction with Phase 2 Cardiac Rehab based on patient barriers.: Yes   Call  MD for:  difficulty breathing, headache or visual disturbances   Complete by: As directed    Call MD for:  extreme fatigue   Complete by: As directed    Call MD for:  hives   Complete by: As directed    Call MD for:  persistant dizziness or light-headedness   Complete by: As directed    Call MD for:  persistant nausea and vomiting   Complete by: As directed    Call MD for:  redness, tenderness, or signs of infection (pain, swelling, redness, odor or green/yellow discharge around incision site)   Complete by: As directed    Call MD for:  severe uncontrolled pain   Complete by: As directed    Call MD for:  temperature >100.4   Complete by: As directed    Diet - low sodium heart healthy   Complete by: As directed    Discharge  instructions   Complete by: As directed    ACTIVITY AND EXERCISE  Daily activity and exercise are an important part of your recovery. People recover at different rates depending on their general health and type of valve procedure.  Most people recovering from TAVR feel better relatively quickly   No lifting, pushing, pulling more than 10 pounds (examples to avoid: groceries, vacuuming, gardening, golfing):             - For one week with a procedure through the groin.             - For six weeks for procedures through the chest wall or neck. NOTE: You will typically see one of our providers 7-14 days after your procedure to discuss [place] the above activities.      DRIVING  Do not drive until you are seen for follow up and cleared by a provider. Generally, we ask patient to not drive for 1 week after their procedure.  If you have been told by your doctor in the past that you may not drive, you must talk with him/her before you begin driving again.   DRESSING  Groin site: you may leave the clear dressing over the site for up to one week or until it falls off.   HYGIENE  If you had a femoral (leg) procedure, you may take a shower when you return home. After the shower, pat the site dry. Do NOT use powder, oils or lotions in your groin area until the site has completely healed.  If you had a chest procedure, you may shower when you return home unless specifically instructed not to by your discharging practitioner.             - DO NOT scrub incision; pat dry with a towel.             - DO NOT apply any lotions, oils, powders to the incision.             - No tub baths / swimming for at least 2 weeks.  If you notice any fevers, chills, increased pain, swelling, bleeding or pus, please contact your doctor.   ADDITIONAL INFORMATION  If you are going to have an upcoming dental procedure, please contact our office as you will require antibiotics ahead of time to prevent infection  on your heart valve.    If you have any questions or concerns you can call the structural heart phone during normal business hours 8am-4pm. If you have an urgent need after hours or weekends please  call [phone] to talk to the on call provider for general cardiology. If you have an emergency that requires immediate attention, please call 911.    After TAVR Checklist  Check  Test Description  Follow up appointment in 1-2 weeks  You will see our structural heart advanced practice provider. Your incision sites will be checked and you will be cleared to drive and resume all normal activities if you are doing well.    1 month echo and follow up  You will have an echo to check on your new heart valve and be seen back in the office by a structural heart advanced practice provider.  Follow up with your primary cardiologist You will need to be seen by your primary cardiologist in the following 3-6 months after your 1 month appointment in the valve clinic. Often times your Plavix or Aspirin will be discontinued during this time, but this is decided on a case by case basis.   1 year echo and follow up You will have another echo to check on your heart valve after 1 year and be seen back in the office by a structural heart advanced practice provider. This your last structural heart visit.  Bacterial endocarditis prophylaxis  You will have to take antibiotics for the rest of your life before all dental procedures (even teeth cleanings) to protect your heart valve. Antibiotics are also required before some surgeries. Please check with your cardiologist before scheduling any surgeries. Also, please make sure to tell us if you have a penicillin allergy as you will require an alternative antibiotic.   If the dressing is still on your incision site when you go home, remove it on the third day after your surgery date. Remove dressing if it begins to fall off, or if it is dirty or damaged before the third day.    Complete by: As directed    Increase activity slowly   Complete by: As directed       Discharge Medications   Allergies as of 11/02/2020   No Known Allergies      Medication List     STOP taking these medications    omeprazole 40 MG capsule Commonly known as: PRILOSEC Replaced by: pantoprazole 40 MG tablet       TAKE these medications    acetaminophen 500 MG tablet Commonly known as: TYLENOL Take 1,000 mg by mouth every 6 (six) hours as needed for moderate pain or mild pain.   ALPRAZolam 0.25 MG tablet Commonly known as: XANAX Take 0.125 mg by mouth daily as needed for anxiety (anxiety/nerves).   aspirin 81 MG chewable tablet Chew 1 tablet (81 mg total) by mouth daily. Start taking on: November 03, 2020   celecoxib 100 MG capsule Commonly known as: CELEBREX Take 100 mg by mouth daily as needed (pain.).   clopidogrel 75 MG tablet Commonly known as: PLAVIX Take 1 tablet (75 mg total) by mouth daily with breakfast. Start taking on: November 03, 2020   fexofenadine 180 MG tablet Commonly known as: ALLEGRA Take 180 mg by mouth daily as needed for allergies or rhinitis.   pantoprazole 40 MG tablet Commonly known as: PROTONIX Take 1 tablet (40 mg total) by mouth daily. Start taking on: November 03, 2020 Replaces: omeprazole 40 MG capsule   traMADol 50 MG tablet Commonly known as: ULTRAM Take 50 mg by mouth daily as needed (pain.).   triamcinolone 55 MCG/ACT Aero nasal inhaler Commonly known as: NASACORT Place 2 sprays into the nose  daily as needed (allergies.).   VITAMIN C PO Take 2 tablets by mouth every evening.   Vitamin D3 50 MCG (2000 UT) Tabs Take 2,000 Units by mouth every evening.   Yeast Balance ICS Caps Take 2 capsules by mouth every evening.   Zinc 50 MG Tabs Take 50 mg by mouth every evening.               Discharge Care Instructions  (From admission, onward)           Start     Ordered   11/02/20 0000  If the dressing  is still on your incision site when you go home, remove it on the third day after your surgery date. Remove dressing if it begins to fall off, or if it is dirty or damaged before the third day.        11/02/20 1125            Outstanding Labs/Studies   None   Duration of Discharge Encounter   Greater than 30 minutes including physician time.  SignedKathyrn Drown, NP 11/02/2020, 11:25 AM [phone]   Patient seen, examined. Available data reviewed. Agree with findings, assessment, and plan as outlined by Kathyrn Drown, NP.  On my exam the patient is alert, oriented, in no distress.  JVP is normal, lungs are clear, heart is regular rate and rhythm with a 1/6 systolic ejection murmur at the right upper sternal border, no diastolic murmur, abdomen soft, nontender, no organomegaly, extremities with no edema, bilateral groin sites are clear.  Telemetry is reviewed and shows normal sinus rhythm with no high-grade AV block and no significant arrhythmia.  The patient's postoperative day #1 echo is personally reviewed and shows a mean gradient between 12 and 14 mmHg and no paravalvular regurgitation.  I think the patient is medically stable for hospital discharge today.  I reviewed post-TAVR discharge instructions with her.  Follow-up is arranged as above.  Sherren Mocha, M.D. 11/02/2020 1:22 PM

## 2020-11-03 ENCOUNTER — Telehealth: Payer: Self-pay | Admitting: Cardiovascular Disease

## 2020-11-03 ENCOUNTER — Telehealth: Payer: Self-pay | Admitting: Cardiology

## 2020-11-03 MED FILL — Magnesium Sulfate Inj 50%: INTRAMUSCULAR | Qty: 10 | Status: AC

## 2020-11-03 MED FILL — Heparin Sodium (Porcine) Inj 1000 Unit/ML: Qty: 1000 | Status: AC

## 2020-11-03 MED FILL — Potassium Chloride Inj 2 mEq/ML: INTRAVENOUS | Qty: 40 | Status: AC

## 2020-11-03 NOTE — Telephone Encounter (Signed)
Pt and daughter calling in to speak with Lauren RN or Valetta Fuller RN with Structural team, about pts incision site from recent TAVR procedure.  Both report that they did remove the pts dressing from her incision about an hour ago, and noted there is very minimal clear drainage "oozing" coming from the right side of her incision.  Daughter states there is no bleeding coming from the incision.  Daughter states the drainage is very minimal and has no foul odor.  Pt has no excessive bleeding, foul odor, or thick, green, yellowish fluid coming from the incision.   Both report no obvious swelling or redness to the site.  Pt does not c/o fever/chills.  Both parties just wanted to make sure this is nothing to worry about.  Pt is also inquiring when she can "fully wash with soap and water."  Went over hospital discharge instructions with both the pt and daughter.  Did inform both parties that her minimal drainage doesn't sound like a problem, but I will forward this message to our Structural RN Navigator to further follow-up and advise on this matter with them. Both verbalized understanding and agrees with this plan.

## 2020-11-03 NOTE — Telephone Encounter (Signed)
  [facility]   MULTIDISCIPLINARY HEART VALVE TEAM   I spoke with the pt and her daughter in regards to groin site.  The pt denies any further drainage from her site at this time.  I advised the pt that when she removed the guaze that part of the fresh tissue may have been attached and that could have contributed to the clear drainage from the site.  The pt and daughter will continue to monitor this area and contact the office with any additional questions or concerns.

## 2020-11-03 NOTE — Telephone Encounter (Signed)
  [place] VALVE TEAM   Patient contacted regarding discharge from [facility] on 11/03/20 s/p TAVR   Patient understands to follow up with provider Angelena Form, PA on 11/2 at 2:30pm at [place].  Patient understands discharge instructions? Yes  Patient understands medications and regimen? Yes  Patient understands to bring all medications to this visit? yes   Kathyrn Drown NP-C Structural Heart Team  Pager: [phone]

## 2020-11-03 NOTE — Telephone Encounter (Signed)
Patient took off the dressings from her incisions . She said that the incisions are oozing  a little and she was not sure if that is normal or not.  Please call the daughter at her house

## 2020-11-09 ENCOUNTER — Encounter: Payer: Self-pay | Admitting: Physician Assistant

## 2020-11-09 ENCOUNTER — Ambulatory Visit (INDEPENDENT_AMBULATORY_CARE_PROVIDER_SITE_OTHER): Payer: Medicare Other | Admitting: Physician Assistant

## 2020-11-09 ENCOUNTER — Other Ambulatory Visit: Payer: Self-pay

## 2020-11-09 VITALS — BP 110/60 | HR 80 | Ht 63.0 in | Wt 164.0 lb

## 2020-11-09 DIAGNOSIS — M1990 Unspecified osteoarthritis, unspecified site: Secondary | ICD-10-CM

## 2020-11-09 DIAGNOSIS — Z952 Presence of prosthetic heart valve: Secondary | ICD-10-CM | POA: Diagnosis not present

## 2020-11-09 DIAGNOSIS — K219 Gastro-esophageal reflux disease without esophagitis: Secondary | ICD-10-CM

## 2020-11-09 MED ORDER — AMOXICILLIN 500 MG PO TABS: 500.0000 mg | ORAL_TABLET | ORAL | 6 refills | Status: DC

## 2020-11-09 NOTE — Progress Notes (Signed)
[facility] [place]                                     Cardiology Office Note:    Date:  11/09/2020   ID:  [name], DOB 1950/05/17, MRN [id number]  PCP:  Neale Burly, MD  [facility] Cardiologist:  Rozann Lesches, MD / Dr. Burt Knack, MD / Dr. Cyndia Bent, MD (TAVR)   [facility] Electrophysiologist:  None   Referring MD: Neale Burly, MD   [facility] s/p TAVR   History of Present Illness:    Grace Thomas is a 70 y.o. female with a hx of GERD, anxiety and severe aortic stenosis s/p TAVR (11/01/20) who presents to clinic for follow up.   Grace Thomas was recently seen in consultation by Dr. Domenic Polite after an echocardiogram at her primary care office demonstrated severe aortic stenosis. This reportedly showed an LVEF greater than 55% with mild mitral regurgitation, mild aortic regurgitation, and severe calcific aortic stenosis with a mean transvalvular gradient of 38 mmHg and dimensionless index of 0.19. Given this, she was referred to Dr. Burt Knack for further evaluation and possible TAVR. At the time of consultation, she was having symptoms of chest heaviness and dyspnea. She underwent R/LHC which showed angiographically normal coronary arteries. Calcification of the aortic valve with restricted leaflet opening, hemodynamic findings consistent with moderately severe aortic stenosis with peak to peak gradient 29 mmHg, mean gradient 22 mmHg,   She was evaluated by the multidisciplinary valve team and underwent successful TAVR with a 23 mm Edwards Sapien 3 Ultra THV via the TF approach on 11/01/20. Post operative echo showed EF 55%, normally functioning TAVR with a mean gradient of 14 mmHg and trivial PVL. She was discharged home aspirin and plavix.   Today the patient presents to clinic for follow up. Here with daughter. No CP or SOB. No LE edema, orthopnea or PND. No dizziness or syncope. No blood in stool or urine. No palpitations. Feels  better and excited to get back to her busy life. Has noticed a lot of bruising on arms with no trauma.    Past Medical History:  Diagnosis Date   Anxiety    Arthritis    COVID-26 July 2020   GERD (gastroesophageal reflux disease)    Recurrent UTI    S/P TAVR (transcatheter aortic valve replacement) 11/01/2020   Edwards 108mm S3U TF approach with Dr. Lupe Carney    Past Surgical History:  Procedure Laterality Date   ABDOMINAL HYSTERECTOMY  1994   TAH BSO   CARDIAC CATHETERIZATION     CARPAL TUNNEL RELEASE Bilateral    RIGHT/LEFT HEART CATH AND CORONARY ANGIOGRAPHY N/A 09/05/2020   Procedure: RIGHT/LEFT HEART CATH AND CORONARY ANGIOGRAPHY;  Surgeon: Sherren Mocha, MD;  Location: [place] [facility];  Service: Cardiovascular;  Laterality: N/A;   ROTATOR CUFF REPAIR Right    TEE WITHOUT CARDIOVERSION N/A 11/01/2020   Procedure: TRANSESOPHAGEAL ECHOCARDIOGRAM (TEE);  Surgeon: Sherren Mocha, MD;  Location: [place];  Service: Open Heart Surgery;  Laterality: N/A;   TRANSCATHETER AORTIC VALVE REPLACEMENT, TRANSFEMORAL N/A 11/01/2020   Procedure: TRANSCATHETER AORTIC VALVE REPLACEMENT, TRANSFEMORAL;  Surgeon: Sherren Mocha, MD;  Location: [place];  Service: Open Heart Surgery;  Laterality: N/A;   TUBAL LIGATION      Current Medications: Current Meds  Medication Sig   acetaminophen (TYLENOL)  500 MG tablet Take 1,000 mg by mouth every 6 (six) hours as needed for moderate pain or mild pain.   ALPRAZolam (XANAX) 0.25 MG tablet Take 0.125 mg by mouth daily as needed for anxiety (anxiety/nerves).   amoxicillin (AMOXIL) 500 MG tablet Take 1 tablet (500 mg total) by mouth as directed. Take 4 tablets by mouth 1 hour prior to dental procedures and cleanings   Ascorbic Acid (VITAMIN C PO) Take 2 tablets by mouth every evening.   aspirin 81 MG chewable tablet Chew 1 tablet (81 mg total) by mouth daily.   celecoxib (CELEBREX) 100 MG capsule Take 100 mg by mouth daily as needed (pain.).    Cholecalciferol (VITAMIN D3) 50 MCG (2000 UT) TABS Take 2,000 Units by mouth every evening.   fexofenadine (ALLEGRA) 180 MG tablet Take 180 mg by mouth daily as needed for allergies or rhinitis.   Misc Natural Products (YEAST BALANCE ICS) CAPS Take 2 capsules by mouth every evening.   pantoprazole (PROTONIX) 40 MG tablet Take 1 tablet (40 mg total) by mouth daily.   traMADol (ULTRAM) 50 MG tablet Take 50 mg by mouth daily as needed (pain.).   triamcinolone (NASACORT) 55 MCG/ACT AERO nasal inhaler Place 2 sprays into the nose daily as needed (allergies.).   Zinc 50 MG TABS Take 50 mg by mouth every evening.   [DISCONTINUED] clopidogrel (PLAVIX) 75 MG tablet Take 1 tablet (75 mg total) by mouth daily with breakfast.   Current Facility-Administered Medications for the 11/09/20 encounter (Office Visit) with Eileen Stanford, PA-C  Medication   0.9 %  sodium chloride infusion     Allergies:   Patient has no known allergies.   Social History   Socioeconomic History   Marital status: Married    Spouse name: Not on file   Number of children: Not on file   Years of education: Not on file   Highest education level: Not on file  Occupational History   Not on file  Tobacco Use   Smoking status: Never   Smokeless tobacco: Never  Vaping Use   Vaping Use: Never used  Substance and Sexual Activity   Alcohol use: No   Drug use: No   Sexual activity: Not on file  Other Topics Concern   Not on file  Social History Narrative   Not on file   Social Determinants of Health   Financial Resource Strain: Not on file  Food Insecurity: Not on file  Transportation Needs: Not on file  Physical Activity: Not on file  Stress: Not on file  Social Connections: Not on file     Family History: The patient's family history includes Bone cancer in her sister; Colitis in her daughter; Heart failure in her mother; Pancreatic cancer in her sister; Stroke in her father.  ROS:   Please see the history  of present illness.    All other systems reviewed and are negative.  EKGs/Labs/Other Studies Reviewed:    The following studies were reviewed today:  TAVR OPERATIVE NOTE     Date of Procedure:                11/01/2020   Preoperative Diagnosis:      Severe Aortic Stenosis    Postoperative Diagnosis:    Same    Procedure:        Transcatheter Aortic Valve Replacement - Percutaneous Right Transfemoral Approach             Edwards Sapien 3 Ultra RSLTHV (  size 23 mm, model # 9755RSL serial # [id number])              Co-Surgeons:                        Gaye Pollack, MD and Sherren Mocha, MD  and Lenna Sciara, MD     Anesthesiologist:                  Hoy Morn, MD   Echocardiographer:              Marry Guan, MD   Pre-operative Echo Findings: Severe aortic stenosis Normal left ventricular systolic function   Post-operative Echo Findings: Trace paravalvular leak Normal left ventricular systolic function  _____________    Echo 11/02/20: IMPRESSIONS   1. 23 mm S3 TAVR. V max 2.4 m/s, MG 14 mmHG, EOA 1.58, DI 0.43. Trivial  paravalvular leak. No significant regurgitation. Normal prosthesis. The  aortic valve has been repaired/replaced. Aortic valve regurgitation is  trivial. There is a 23 mm Edwards  Sapien prosthetic (TAVR) valve present in the aortic position. Procedure  Date: 11/01/2020.   2. Left ventricular ejection fraction, by estimation, is 55 to 60%. The  left ventricle has normal function. The left ventricle has no regional  wall motion abnormalities. Left ventricular diastolic parameters are  consistent with Grade I diastolic  dysfunction (impaired relaxation).   3. Right ventricular systolic function is normal. The right ventricular  size is normal. Tricuspid regurgitation signal is inadequate for assessing  PA pressure.   4. The mitral valve is rheumatic. Mild mitral valve regurgitation. No  evidence of mitral stenosis.   5. The inferior vena cava is  normal in size with greater than 50%  respiratory variability, suggesting right atrial pressure of 3 mmHg.    EKG:  EKG is ordered today.  The ekg ordered today demonstrates sinus HR 80b  Recent Labs: 10/28/2020: ALT 20 11/02/2020: BUN 11; Creatinine, Ser 0.79; Hemoglobin 11.8; Platelets 217; Potassium 3.8; Sodium 137  Recent Lipid Panel No results found for: CHOL, TRIG, HDL, CHOLHDL, VLDL, LDLCALC, LDLDIRECT   Risk Assessment/Calculations:       Physical Exam:    VS:  BP 110/60 (BP Location: Left Arm, Patient Position: Sitting, Cuff Size: Normal)   Pulse 80   Ht 5\' 3"  (1.6 m)   Wt 164 lb (74.4 kg)   SpO2 98%   BMI 29.05 kg/m     Wt Readings from Last 3 Encounters:  11/09/20 164 lb (74.4 kg)  11/02/20 165 lb 9.6 oz (75.1 kg)  10/28/20 167 lb (75.8 kg)     GEN:  Well nourished, well developed in no acute distress HEENT: Normal NECK: No JVD LYMPHATICS: No lymphadenopathy CARDIAC: RRR, no murmurs, rubs, gallops RESPIRATORY:  Clear to auscultation without rales, wheezing or rhonchi  ABDOMEN: Soft, non-tender, non-distended MUSCULOSKELETAL:  No edema; No deformity  SKIN: Warm and dry.  Groin sites clear without hematoma or ecchymosis  NEUROLOGIC:  Alert and oriented x 3 PSYCHIATRIC:  Normal affect   ASSESSMENT:    1. S/P TAVR (transcatheter aortic valve replacement)   2. Gastroesophageal reflux disease, unspecified whether esophagitis present   3. Arthritis    PLAN:    In order of problems listed above:  Severe AS s/p TAVR: doing great 1 week out from TAVR. Groin sites healing well. ECG with no HAVB. SBE prophylaxis discussed; I have RX'd amoxicillin. On aspirin and plavix but also takes Celebrex.  Will stop plavix at this time. I will see her back later this month for follow up and echo.   GERD: continue protonix.   Arthritis: continue on Celebrex     Cardiac Rehabilitation Eligibility Assessment    Patient declines cardiac rehab as she ia very active at  baseline.        Medication Adjustments/Labs and Tests Ordered: Current medicines are reviewed at length with the patient today.  Concerns regarding medicines are outlined above.  Orders Placed This Encounter  Procedures   EKG 12-Lead    Meds ordered this encounter  Medications   amoxicillin (AMOXIL) 500 MG tablet    Sig: Take 1 tablet (500 mg total) by mouth as directed. Take 4 tablets by mouth 1 hour prior to dental procedures and cleanings    Dispense:  12 tablet    Refill:  6     Patient Instructions  Medication Instructions:  1) Discontinue Plavix   2) Start Amoxicillin 500 mg, take 4 tablets by mouth 1 hour prior to dental procedures and cleanings   *If you need a refill on your cardiac medications before your next appointment, please call your pharmacy*   Lab Work: None ordered   If you have labs (blood work) drawn today and your tests are completely normal, you will receive your results only by: [place] (if you have MyChart) OR A paper copy in the mail If you have any lab test that is abnormal or we need to change your treatment, we will call you to review the results.   Testing/Procedures: None ordered    Follow-Up: Follow up as scheduled    Other Instructions None     Signed, Angelena Form, PA-C  11/09/2020 3:13 PM    [facility]

## 2020-11-09 NOTE — Patient Instructions (Addendum)
Medication Instructions:  1) Discontinue Plavix   2) Start Amoxicillin 500 mg, take 4 tablets by mouth 1 hour prior to dental procedures and cleanings   *If you need a refill on your cardiac medications before your next appointment, please call your pharmacy*   Lab Work: None ordered   If you have labs (blood work) drawn today and your tests are completely normal, you will receive your results only by: [place] (if you have MyChart) OR A paper copy in the mail If you have any lab test that is abnormal or we need to change your treatment, we will call you to review the results.   Testing/Procedures: None ordered    Follow-Up: Follow up as scheduled    Other Instructions None

## 2020-11-11 ENCOUNTER — Telehealth: Payer: Self-pay | Admitting: Pharmacist

## 2020-11-11 NOTE — Telephone Encounter (Signed)
Transitions of Care Pharmacy       Call attempted for a pharmacy transitions of care follow-up. Spoke with patient's husband and informed patient is out of town until following week.     Call attempt #1. Will follow-up in 2-3 days.

## 2020-11-21 ENCOUNTER — Telehealth: Payer: Self-pay

## 2020-11-21 NOTE — Telephone Encounter (Signed)
  [facility]   MULTIDISCIPLINARY HEART VALVE TEAM  Pt's daughter called the office due to concerns with the amount of bruising the pt currently has all over her body.  The pt did stop plavix based on instructions from 11/2 office visit. Per Almyra Free the pt has had to increase her frequency of taking Celebrex and this may be contributing to bruising. Discussed with Nell Range PA-C and the pt can have a CBC drawn to check blood count.  I made Almyra Free aware of this and she will speak to the pt about having lab work.  The pt has a pending appointment with the Structural team on 11/30 and if she does not want blood work prior to this visit then we can discuss further at that time. Almyra Free agreed with plan.

## 2020-12-03 NOTE — Progress Notes (Signed)
HE[facility] AND [place]                                     Cardiology Office Note:    Date:  12/08/2020   ID:  Grace Thomas, DOB [birth year], MRN [id number]  PCP:  Neale Burly, MD  [facility] Cardiologist:  Rozann Lesches, MD / Dr. Burt Knack, MD / Dr. Cyndia Bent, MD (TAVR)   [facility] Electrophysiologist:  None   Referring MD: Neale Burly, MD   1 month s/p TAVR   History of Present Illness:    Grace Thomas is a 70 y.o. female with a hx of GERD, anxiety and severe aortic stenosis s/p TAVR (11/01/20) who presents to clinic for follow up.   Grace Thomas was recently seen in consultation by Dr. Domenic Polite after an echocardiogram at her primary care office demonstrated severe aortic stenosis. This reportedly showed an LVEF greater than 55% with mild mitral regurgitation, mild aortic regurgitation, and severe calcific aortic stenosis with a mean transvalvular gradient of 38 mmHg and dimensionless index of 0.19. Given this, she was referred to Dr. Burt Knack for further evaluation and possible TAVR. At the time of consultation, she was having symptoms of chest heaviness and dyspnea. She underwent R/LHC which showed angiographically normal coronary arteries. Calcification of the aortic valve with restricted leaflet opening, hemodynamic findings consistent with moderately severe aortic stenosis with peak to peak gradient 29 mmHg, mean gradient 22 mmHg,   She was evaluated by the multidisciplinary valve team and underwent successful TAVR with a 23 mm Edwards Sapien 3 Ultra THV via the TF approach on 11/01/20. Post operative echo showed EF 55%, normally functioning TAVR with a mean gradient of 14 mmHg and trivial PVL. She was discharged home aspirin and plavix. Later Plavix was discontinued due to easy bruising and need to take Celebrex  Today the patient presents to clinic for follow up. Here with her daughter. She is doing very well. No CP or SOB.  No LE edema, orthopnea or PND. No dizziness or syncope. No blood in stool or urine. No palpitations.      Past Medical History:  Diagnosis Date   Anxiety    Arthritis    COVID-26 July 2020   GERD (gastroesophageal reflux disease)    Recurrent UTI    S/P TAVR (transcatheter aortic valve replacement) 11/01/2020   Edwards 11mm S3U TF approach with Dr. Lupe Carney    Past Surgical History:  Procedure Laterality Date   ABDOMINAL HYSTERECTOMY  1994   TAH BSO   CARDIAC CATHETERIZATION     CARPAL TUNNEL RELEASE Bilateral    RIGHT/LEFT HEART CATH AND CORONARY ANGIOGRAPHY N/A 09/05/2020   Procedure: RIGHT/LEFT HEART CATH AND CORONARY ANGIOGRAPHY;  Surgeon: Sherren Mocha, MD;  Location: [place] [facility];  Service: Cardiovascular;  Laterality: N/A;   ROTATOR CUFF REPAIR Right    TEE WITHOUT CARDIOVERSION N/A 11/01/2020   Procedure: TRANSESOPHAGEAL ECHOCARDIOGRAM (TEE);  Surgeon: Sherren Mocha, MD;  Location: [place];  Service: Open Heart Surgery;  Laterality: N/A;   TRANSCATHETER AORTIC VALVE REPLACEMENT, TRANSFEMORAL N/A 11/01/2020   Procedure: TRANSCATHETER AORTIC VALVE REPLACEMENT, TRANSFEMORAL;  Surgeon: Sherren Mocha, MD;  Location: [place];  Service: Open Heart Surgery;  Laterality: N/A;   TUBAL LIGATION      Current Medications: Current Meds  Medication Sig   acetaminophen (TYLENOL) 500  MG tablet Take 1,000 mg by mouth every 6 (six) hours as needed for moderate pain or mild pain.   ALPRAZolam (XANAX) 0.25 MG tablet Take 0.125 mg by mouth daily as needed for anxiety (anxiety/nerves).   amoxicillin (AMOXIL) 500 MG tablet Take 1 tablet (500 mg total) by mouth as directed. Take 4 tablets by mouth 1 hour prior to dental procedures and cleanings   Ascorbic Acid (VITAMIN C PO) Take 2 tablets by mouth every evening.   aspirin 81 MG chewable tablet Chew 1 tablet (81 mg total) by mouth daily.   celecoxib (CELEBREX) 100 MG capsule Take 100 mg by mouth daily as needed (pain.).    Cholecalciferol (VITAMIN D3) 50 MCG (2000 UT) TABS Take 2,000 Units by mouth every evening.   fexofenadine (ALLEGRA) 180 MG tablet Take 180 mg by mouth daily as needed for allergies or rhinitis.   Misc Natural Products (YEAST BALANCE ICS) CAPS Take 2 capsules by mouth every evening.   pantoprazole (PROTONIX) 40 MG tablet Take 1 tablet (40 mg total) by mouth daily.   traMADol (ULTRAM) 50 MG tablet Take 50 mg by mouth daily as needed (pain.).   triamcinolone (NASACORT) 55 MCG/ACT AERO nasal inhaler Place 2 sprays into the nose daily as needed (allergies.).   Zinc 50 MG TABS Take 50 mg by mouth every evening.   Current Facility-Administered Medications for the 12/07/20 encounter (Office Visit) with Eileen Stanford, PA-C  Medication   0.9 %  sodium chloride infusion     Allergies:   Patient has no known allergies.   Social History   Socioeconomic History   Marital status: Married    Spouse name: Not on file   Number of children: Not on file   Years of education: Not on file   Highest education level: Not on file  Occupational History   Not on file  Tobacco Use   Smoking status: Never   Smokeless tobacco: Never  Vaping Use   Vaping Use: Never used  Substance and Sexual Activity   Alcohol use: No   Drug use: No   Sexual activity: Not on file  Other Topics Concern   Not on file  Social History Narrative   Not on file   Social Determinants of Health   Financial Resource Strain: Not on file  Food Insecurity: Not on file  Transportation Needs: Not on file  Physical Activity: Not on file  Stress: Not on file  Social Connections: Not on file     Family History: The patient's family history includes Bone cancer in her sister; Colitis in her daughter; Heart failure in her mother; Pancreatic cancer in her sister; Stroke in her father.  ROS:   Please see the history of present illness.    All other systems reviewed and are negative.  EKGs/Labs/Other Studies Reviewed:     The following studies were reviewed today:  TAVR OPERATIVE NOTE     Date of Procedure:                11/01/2020   Preoperative Diagnosis:      Severe Aortic Stenosis    Postoperative Diagnosis:    Same    Procedure:        Transcatheter Aortic Valve Replacement - Percutaneous Right Transfemoral Approach             Edwards Sapien 3 Ultra RSLTHV (size 23 mm, model # 9755RSL serial # [id number])  Co-Surgeons:                        Gaye Pollack, MD and Sherren Mocha, MD  and Lenna Sciara, MD     Anesthesiologist:                  Hoy Morn, MD   Echocardiographer:              Marry Guan, MD   Pre-operative Echo Findings: Severe aortic stenosis Normal left ventricular systolic function   Post-operative Echo Findings: Trace paravalvular leak Normal left ventricular systolic function  _____________    Echo 11/02/20: IMPRESSIONS   1. 23 mm S3 TAVR. V max 2.4 m/s, MG 14 mmHG, EOA 1.58, DI 0.43. Trivial  paravalvular leak. No significant regurgitation. Normal prosthesis. The  aortic valve has been repaired/replaced. Aortic valve regurgitation is  trivial. There is a 23 mm Edwards  Sapien prosthetic (TAVR) valve present in the aortic position. Procedure  Date: 11/01/2020.   2. Left ventricular ejection fraction, by estimation, is 55 to 60%. The  left ventricle has normal function. The left ventricle has no regional  wall motion abnormalities. Left ventricular diastolic parameters are  consistent with Grade I diastolic  dysfunction (impaired relaxation).   3. Right ventricular systolic function is normal. The right ventricular  size is normal. Tricuspid regurgitation signal is inadequate for assessing  PA pressure.   4. The mitral valve is rheumatic. Mild mitral valve regurgitation. No  evidence of mitral stenosis.   5. The inferior vena cava is normal in size with greater than 50%  respiratory variability, suggesting right atrial pressure of 3 mmHg.    __________________  Echo 12/07/20 IMPRESSIONS   1. Left ventricular ejection fraction, by estimation, is 60 to 65%. The  left ventricle has normal function. The left ventricle has no regional  wall motion abnormalities. Left ventricular diastolic parameters are  indeterminate.   2. Right ventricular systolic function is normal. The right ventricular  size is normal. There is normal pulmonary artery systolic pressure.   3. The mitral valve is rheumatic. Trivial mitral valve regurgitation.  Mild mitral stenosis. MG 24mmHg at 82 bpm, MVA 2.0 cm^2 by PHT   4. The inferior vena cava is normal in size with greater than 50%  respiratory variability, suggesting right atrial pressure of 3 mmHg.   5. There is a 23 mm Edwards Sapien prosthetic, stented (TAVR) valve  present in the aortic position      Aortic valve regurgitation is trivial. Echo findings are consistent  with normal structure and function of the aortic valve prosthesis. Vmax  2.3 m/s, MG 12 mmHg, DI 0.41   EKG:  EKG is NOT ordered today.    Recent Labs: 10/28/2020: ALT 20 11/02/2020: BUN 11; Creatinine, Ser 0.79; Hemoglobin 11.8; Platelets 217; Potassium 3.8; Sodium 137  Recent Lipid Panel No results found for: CHOL, TRIG, HDL, CHOLHDL, VLDL, LDLCALC, LDLDIRECT   Risk Assessment/Calculations:       Physical Exam:    VS:  BP 108/68   Pulse 81   Ht 5\' 3"  (1.6 m)   Wt 166 lb (75.3 kg)   SpO2 95%   BMI 29.41 kg/m     Wt Readings from Last 3 Encounters:  12/07/20 166 lb (75.3 kg)  11/09/20 164 lb (74.4 kg)  11/02/20 165 lb 9.6 oz (75.1 kg)     GEN:  Well nourished, well developed in no acute distress HEENT: Normal  NECK: No JVD LYMPHATICS: No lymphadenopathy CARDIAC: RRR, no murmurs, rubs, gallops RESPIRATORY:  Clear to auscultation without rales, wheezing or rhonchi  ABDOMEN: Soft, non-tender, non-distended MUSCULOSKELETAL:  No edema; No deformity  SKIN: Warm and dry.   NEUROLOGIC:  Alert and oriented x  3 PSYCHIATRIC:  Normal affect   ASSESSMENT:    1. S/P TAVR (transcatheter aortic valve replacement)   2. Gastroesophageal reflux disease, unspecified whether esophagitis present   3. Arthritis     PLAN:    In order of problems listed above:  Severe AS s/p TAVR: echo today shows EF 60%, normally functioning TAVR with a mean gradient of 8 mm hg and trivial PVL. She has NYHA class I symptoms. She will continue on aspirin indefinitely and has amoxicillin for SBE prophylaxis. I will see her back next year for follow up with echo.  GERD: continue protonix.   Arthritis: continue on Celebrex   Medication Adjustments/Labs and Tests Ordered: Current medicines are reviewed at length with the patient today.  Concerns regarding medicines are outlined above.  Orders Placed This Encounter  Procedures   CBC   ECHOCARDIOGRAM COMPLETE     No orders of the defined types were placed in this encounter.    Patient Instructions  Medication Instructions:  Your physician recommends that you continue on your current medications as directed. Please refer to the Current Medication list given to you today.  *If you need a refill on your cardiac medications before your next appointment, please call your pharmacy*   Lab Work: None ordered   If you have labs (blood work) drawn today and your tests are completely normal, you will receive your results only by: [place] (if you have MyChart) OR A paper copy in the mail If you have any lab test that is abnormal or we need to change your treatment, we will call you to review the results.   Testing/Procedures: Your physician has requested that you have an echocardiogram in October 2023 Echocardiography is a painless test that uses sound waves to create images of your heart. It provides your doctor with information about the size and shape of your heart and how well your heart's chambers and valves are working. This procedure takes approximately  one hour. There are no restrictions for this procedure.     Follow-Up: Follow up as scheduled    Other Instructions None ordered     Signed, Angelena Form, PA-C  12/08/2020 2:09 AM    [facility]

## 2020-12-07 ENCOUNTER — Other Ambulatory Visit (HOSPITAL_COMMUNITY): Payer: Medicare Other

## 2020-12-07 ENCOUNTER — Ambulatory Visit (HOSPITAL_COMMUNITY): Payer: Medicare Other | Attending: Cardiology

## 2020-12-07 ENCOUNTER — Ambulatory Visit (INDEPENDENT_AMBULATORY_CARE_PROVIDER_SITE_OTHER): Payer: Medicare Other | Admitting: Physician Assistant

## 2020-12-07 ENCOUNTER — Other Ambulatory Visit: Payer: Self-pay

## 2020-12-07 ENCOUNTER — Encounter: Payer: Self-pay | Admitting: Physician Assistant

## 2020-12-07 VITALS — BP 108/68 | HR 81 | Ht 63.0 in | Wt 166.0 lb

## 2020-12-07 DIAGNOSIS — Z952 Presence of prosthetic heart valve: Secondary | ICD-10-CM

## 2020-12-07 DIAGNOSIS — K219 Gastro-esophageal reflux disease without esophagitis: Secondary | ICD-10-CM | POA: Diagnosis not present

## 2020-12-07 DIAGNOSIS — M1990 Unspecified osteoarthritis, unspecified site: Secondary | ICD-10-CM

## 2020-12-07 LAB — ECHOCARDIOGRAM COMPLETE
AV Mean grad: 10.8 mmHg
AV Peak grad: 19.5 mmHg
Ao pk vel: 2.21 m/s
Area-P 1/2: 3.48 cm2
P 1/2 time: 291 msec
S' Lateral: 2.9 cm

## 2020-12-07 NOTE — Patient Instructions (Addendum)
Medication Instructions:  Your physician recommends that you continue on your current medications as directed. Please refer to the Current Medication list given to you today.  *If you need a refill on your cardiac medications before your next appointment, please call your pharmacy*   Lab Work: None ordered   If you have labs (blood work) drawn today and your tests are completely normal, you will receive your results only by: [place] (if you have MyChart) OR A paper copy in the mail If you have any lab test that is abnormal or we need to change your treatment, we will call you to review the results.   Testing/Procedures: Your physician has requested that you have an echocardiogram in October 2023 Echocardiography is a painless test that uses sound waves to create images of your heart. It provides your doctor with information about the size and shape of your heart and how well your heart's chambers and valves are working. This procedure takes approximately one hour. There are no restrictions for this procedure.     Follow-Up: Follow up as scheduled    Other Instructions None ordered

## 2020-12-14 DIAGNOSIS — M67441 Ganglion, right hand: Secondary | ICD-10-CM | POA: Diagnosis not present

## 2020-12-14 DIAGNOSIS — M79644 Pain in right finger(s): Secondary | ICD-10-CM | POA: Insufficient documentation

## 2021-01-06 ENCOUNTER — Other Ambulatory Visit: Payer: Self-pay

## 2021-01-06 MED ORDER — PANTOPRAZOLE SODIUM 40 MG PO TBEC: 40.0000 mg | DELAYED_RELEASE_TABLET | Freq: Every day | ORAL | 3 refills | Status: DC

## 2021-01-11 DIAGNOSIS — M545 Low back pain, unspecified: Secondary | ICD-10-CM | POA: Diagnosis not present

## 2021-01-11 DIAGNOSIS — M5412 Radiculopathy, cervical region: Secondary | ICD-10-CM | POA: Diagnosis not present

## 2021-01-11 DIAGNOSIS — M542 Cervicalgia: Secondary | ICD-10-CM | POA: Diagnosis not present

## 2021-01-18 ENCOUNTER — Other Ambulatory Visit (HOSPITAL_COMMUNITY): Payer: Self-pay | Admitting: Neurosurgery

## 2021-01-18 DIAGNOSIS — M542 Cervicalgia: Secondary | ICD-10-CM

## 2021-01-27 ENCOUNTER — Ambulatory Visit (HOSPITAL_COMMUNITY)
Admission: RE | Admit: 2021-01-27 | Discharge: 2021-01-27 | Disposition: A | Discharge status: Home / Self Care | Payer: Medicare Other | Source: Ambulatory Visit | Attending: Neurosurgery | Admitting: Neurosurgery

## 2021-01-27 ENCOUNTER — Other Ambulatory Visit: Payer: Self-pay

## 2021-01-27 DIAGNOSIS — M4312 Spondylolisthesis, cervical region: Secondary | ICD-10-CM | POA: Diagnosis not present

## 2021-01-27 DIAGNOSIS — M542 Cervicalgia: Secondary | ICD-10-CM | POA: Diagnosis not present

## 2021-01-27 DIAGNOSIS — M2578 Osteophyte, vertebrae: Secondary | ICD-10-CM | POA: Diagnosis not present

## 2021-02-06 DIAGNOSIS — M5412 Radiculopathy, cervical region: Secondary | ICD-10-CM | POA: Diagnosis not present

## 2021-02-08 ENCOUNTER — Telehealth: Payer: Self-pay | Admitting: *Deleted

## 2021-02-08 NOTE — Telephone Encounter (Signed)
° °  Pre-operative Risk Assessment    Patient Name: Grace Thomas  DOB: 02/11/1950 MRN: [id number]      Request for Surgical Clearance    Procedure:   Cervical Fusion  Date of Surgery:  Clearance TBD                                 Surgeon:  Elwin Sleight MD Surgeon's Group or Practice Name:  [facility] Phone number:  [phone] Fax number:  [phone]   Type of Clearance Requested:   - Medical    Type of Anesthesia:  General    Additional requests/questions:  Please fax a copy of office notes (attention: Nikki) to the surgeon's office.  Phineas Inches   02/08/2021, 9:58 AM

## 2021-02-08 NOTE — Telephone Encounter (Signed)
° ° °  Patient Name: Grace Thomas  DOB: 10/29/1950 MRN: [id number]  Primary Cardiologist: Rozann Lesches, MD  Chart reviewed as part of pre-operative protocol coverage. Given past medical history and time since last visit, based on ACC/AHA guidelines, [name] would be at acceptable risk for the planned procedure without further cardiovascular testing.   Recent cardiac catheterization demonstrated normal coronary arteries.  Since the TAVR procedure, repeat echocardiogram obtained on 12/07/2020 demonstrated normal EF, normal functioning TAVR valve.  The patient was advised that if she develops new symptoms prior to surgery to contact our office to arrange for a follow-up visit, and she verbalized understanding.  I will route this recommendation to the requesting party via Epic fax function and remove from pre-op pool.  Please call with questions.  Almyra Deforest, [place] 02/08/2021, 10:33 AM

## 2021-02-13 DIAGNOSIS — M25561 Pain in right knee: Secondary | ICD-10-CM | POA: Diagnosis not present

## 2021-02-13 DIAGNOSIS — Z1331 Encounter for screening for depression: Secondary | ICD-10-CM | POA: Diagnosis not present

## 2021-02-13 DIAGNOSIS — Z0000 Encounter for general adult medical examination without abnormal findings: Secondary | ICD-10-CM | POA: Diagnosis not present

## 2021-02-13 DIAGNOSIS — K219 Gastro-esophageal reflux disease without esophagitis: Secondary | ICD-10-CM | POA: Diagnosis not present

## 2021-02-13 DIAGNOSIS — M5441 Lumbago with sciatica, right side: Secondary | ICD-10-CM | POA: Diagnosis not present

## 2021-02-13 DIAGNOSIS — F064 Anxiety disorder due to known physiological condition: Secondary | ICD-10-CM | POA: Diagnosis not present

## 2021-02-13 DIAGNOSIS — I358 Other nonrheumatic aortic valve disorders: Secondary | ICD-10-CM | POA: Diagnosis not present

## 2021-02-14 ENCOUNTER — Telehealth: Payer: Self-pay | Admitting: Cardiology

## 2021-02-14 NOTE — Telephone Encounter (Addendum)
° °  Patient Name: Grace Thomas  DOB: [birth year] MRN: [id number]  Primary Cardiologist: Rozann Lesches, MD  Chart reviewed as part of pre-operative protocol coverage. Clearance was addressed in other phone note, 02/08/21. Will re-fax to requesting number which is also for [facility]. Per original surgical request will also bundle last office note.  Charlie Pitter, PA-C 02/14/2021, 3:02 PM

## 2021-02-14 NOTE — Telephone Encounter (Signed)
Follow Up:    Patient said she was told by her surgeon to call and check on the status of her clearance. They said they had not received it. Please fax ,to  [phone].

## 2021-02-15 ENCOUNTER — Other Ambulatory Visit: Payer: Self-pay | Admitting: Neurological Surgery

## 2021-02-21 ENCOUNTER — Other Ambulatory Visit: Payer: Self-pay | Admitting: Neurological Surgery

## 2021-02-22 NOTE — Progress Notes (Signed)
Surgical Instructions    Your procedure is scheduled on Tuesday, February 21st, 2023.   Report to [facility] Main Entrance "A" at 10:40 A.M., then check in with the Admitting office.  Call this number if you have problems the morning of surgery:  [phone]   If you have any questions prior to your surgery date call [phone]: Open Monday-Friday 8am-4pm    Remember:  Do not eat after midnight the night before your surgery  You may drink clear liquids until 09:40 the morning of your surgery.   Clear liquids allowed are: Water, Non-Citrus Juices (without pulp), Carbonated Beverages, Clear Tea, Black Coffee ONLY (NO MILK, CREAM OR POWDERED CREAMER of any kind), and Gatorade    Take these medicines the morning of surgery with A SIP OF WATER:   pantoprazole (PROTONIX)  If needed:  acetaminophen (TYLENOL)  ALPRAZolam (XANAX)  fexofenadine (ALLEGRA)  traMADol (ULTRAM)  Follow your surgeon's instructions on when to stop Aspirin.  If no instructions were given by your surgeon then you will need to call the office to get those instructions.     As of today, STOP taking any Aspirin (unless otherwise instructed by your surgeon) Aleve, Naproxen, Ibuprofen, Motrin, Advil, Goody's, BC's, all herbal medications, fish oil, and all vitamins.    The day of surgery:          Do not wear jewelry or makeup Do not wear lotions, powders, perfumes, or deodorant. Do not shave 48 hours prior to surgery.   Do not bring valuables to the hospital. Do not wear nail polish, gel polish, artificial nails, or any other type of covering on natural nails (fingers and toes) If you have artificial nails or gel coating that need to be removed by a nail salon, please have this removed prior to surgery. Artificial nails or gel coating may interfere with anesthesia's ability to adequately monitor your vital signs.   [facility] is not responsible for any belongings or valuables. .   Do NOT Smoke  (Tobacco/Vaping)  24 hours prior to your procedure  If you use a CPAP at night, you may bring your mask for your overnight stay.   Contacts, glasses, hearing aids, dentures or partials may not be worn into surgery, please bring cases for these belongings   For patients admitted to the hospital, discharge time will be determined by your treatment team.   Patients discharged the day of surgery will not be allowed to drive home, and someone needs to stay with them for 24 hours.  NO VISITORS WILL BE ALLOWED IN PRE-OP WHERE PATIENTS ARE PREPPED FOR SURGERY.  ONLY 1 SUPPORT PERSON MAY BE PRESENT IN THE WAITING ROOM WHILE YOU ARE IN SURGERY.  IF YOU ARE TO BE ADMITTED, ONCE YOU ARE IN YOUR ROOM YOU WILL BE ALLOWED TWO (2) VISITORS. 1 (ONE) VISITOR MAY STAY OVERNIGHT BUT MUST ARRIVE TO THE ROOM BY 8pm.  Minor children may have two parents present. Special consideration for safety and communication needs will be reviewed on a case by case basis.  Special instructions:    Oral Hygiene is also important to reduce your risk of infection.  Remember - BRUSH YOUR TEETH THE MORNING OF SURGERY WITH YOUR REGULAR TOOTHPASTE   [facility]- Preparing For Surgery  Before surgery, you can play an important role. Because skin is not sterile, your skin needs to be as free of germs as possible. You can reduce the number of germs on your skin by washing with CHG (chlorahexidine  gluconate) Soap before surgery.  CHG is an antiseptic cleaner which kills germs and bonds with the skin to continue killing germs even after washing.     Please do not use if you have an allergy to CHG or antibacterial soaps. If your skin becomes reddened/irritated stop using the CHG.  Do not shave (including legs and underarms) for at least 48 hours prior to first CHG shower. It is OK to shave your face.  Please follow these instructions carefully.     Shower the NIGHT BEFORE SURGERY and the MORNING OF SURGERY with CHG Soap.   If you chose  to wash your hair, wash your hair first as usual with your normal shampoo. After you shampoo, rinse your hair and body thoroughly to remove the shampoo.  Then [employer] and genitals (private parts) with your normal soap and rinse thoroughly to remove soap.  After that Use CHG Soap as you would any other liquid soap. You can apply CHG directly to the skin and wash gently with a scrungie or a clean washcloth.   Apply the CHG Soap to your body ONLY FROM THE NECK DOWN.  Do not use on open wounds or open sores. Avoid contact with your eyes, ears, mouth and genitals (private parts). Wash Face and genitals (private parts)  with your normal soap.   Wash thoroughly, paying special attention to the area where your surgery will be performed.  Thoroughly rinse your body with warm water from the neck down.  DO NOT shower/wash with your normal soap after using and rinsing off the CHG Soap.  Pat yourself dry with a CLEAN TOWEL.  Wear CLEAN PAJAMAS to bed the night before surgery  Place CLEAN SHEETS on your bed the night before your surgery  DO NOT SLEEP WITH PETS.   Day of Surgery:  Take a shower with CHG soap. Wear Clean/Comfortable clothing the morning of surgery Do not apply any deodorants/lotions.   Remember to brush your teeth WITH YOUR REGULAR TOOTHPASTE.    COVID testing  If you are going to stay overnight or be admitted after your procedure/surgery and require a pre-op COVID test, please follow these instructions after your COVID test   You are not required to quarantine however you are required to wear a well-fitting mask when you are out and around people not in your household.  If your mask becomes wet or soiled, replace with a new one.  Wash your hands often with soap and water for 20 seconds or clean your hands with an alcohol-based hand sanitizer that contains at least 60% alcohol.  Do not share personal items.  Notify your provider: if you are in close contact with someone  who has COVID  or if you develop a fever of 100.4 or greater, sneezing, cough, sore throat, shortness of breath or body aches.    Please read over the following fact sheets that you were given.

## 2021-02-23 ENCOUNTER — Encounter (HOSPITAL_COMMUNITY)
Admission: RE | Admit: 2021-02-23 | Discharge: 2021-02-23 | Disposition: A | Discharge status: Home / Self Care | Payer: Medicare Other | Source: Ambulatory Visit | Attending: Neurological Surgery | Admitting: Neurological Surgery

## 2021-02-23 ENCOUNTER — Other Ambulatory Visit: Payer: Self-pay

## 2021-02-23 ENCOUNTER — Encounter (HOSPITAL_COMMUNITY): Payer: Self-pay

## 2021-02-23 VITALS — BP 123/68 | HR 84 | Temp 98.6°F | Resp 18 | Ht 63.0 in | Wt 169.3 lb

## 2021-02-23 DIAGNOSIS — I2510 Atherosclerotic heart disease of native coronary artery without angina pectoris: Secondary | ICD-10-CM

## 2021-02-23 DIAGNOSIS — M5412 Radiculopathy, cervical region: Secondary | ICD-10-CM | POA: Insufficient documentation

## 2021-02-23 DIAGNOSIS — I350 Nonrheumatic aortic (valve) stenosis: Secondary | ICD-10-CM | POA: Insufficient documentation

## 2021-02-23 DIAGNOSIS — Z952 Presence of prosthetic heart valve: Secondary | ICD-10-CM | POA: Insufficient documentation

## 2021-02-23 DIAGNOSIS — Z01812 Encounter for preprocedural laboratory examination: Secondary | ICD-10-CM | POA: Insufficient documentation

## 2021-02-23 DIAGNOSIS — Z01818 Encounter for other preprocedural examination: Secondary | ICD-10-CM

## 2021-02-23 LAB — BASIC METABOLIC PANEL
Anion gap: 9 (ref 5–15)
BUN: 13 mg/dL (ref 8–23)
CO2: 25 mmol/L (ref 22–32)
Calcium: 9 mg/dL (ref 8.9–10.3)
Chloride: 104 mmol/L (ref 98–111)
Creatinine, Ser: 0.88 mg/dL (ref 0.44–1.00)
GFR, Estimated: >60 mL/min (ref >60)
Glucose, Bld: 95 mg/dL (ref 70–99)
Potassium: 4 mmol/L (ref 3.5–5.1)
Sodium: 138 mmol/L (ref 135–145)

## 2021-02-23 LAB — CBC
HCT: 41.5 % (ref 36.0–46.0)
Hemoglobin: 13.4 g/dL (ref 12.0–15.0)
MCH: 30.9 pg (ref 26.0–34.0)
MCHC: 32.3 g/dL (ref 30.0–36.0)
MCV: 95.6 fL (ref 80.0–100.0)
Platelets: 257 10*3/uL (ref 150–400)
RBC: 4.34 MIL/uL (ref 3.87–5.11)
RDW: 11.7 % (ref 11.5–15.5)
WBC: 8.3 10*3/uL (ref 4.0–10.5)
nRBC: 0 % (ref 0.0–0.2)

## 2021-02-23 LAB — TYPE AND SCREEN
ABO/RH(D): A POS
Antibody Screen: NEGATIVE

## 2021-02-23 LAB — SURGICAL PCR SCREEN
MRSA, PCR: NEGATIVE
Staphylococcus aureus: NEGATIVE

## 2021-02-23 NOTE — Progress Notes (Signed)
PCP - Dr. Stoney Bang Cardiologist - Dr. Rozann Lesches  PPM/ICD - n/a  Chest x-ray - 10/28/20 EKG - 11/09/20 Stress Test - denies ECHO - 12/07/20 Cardiac Cath - 09/05/20  Sleep Study - denies CPAP - denies  Blood Thinner Instructions: n/a Aspirin Instructions: LD 02/21/21  ERAS Protcol -Clear liquids until 0930 DOS. PRE-SURGERY Ensure or G2- none ordered  COVID TEST- Pt will need covid testing DOS. Pt lives over an hour away and PAT appt was 5 days out. Pt will arrive 3 hours early for testing.   Anesthesia review: s/p TAVR procedure 4 months ago.  Patient denies shortness of breath, fever, cough and chest pain at PAT appointment   All instructions explained to the patient, with a verbal understanding of the material. Patient agrees to go over the instructions while at home for a better understanding. Patient also instructed to self quarantine after being tested for COVID-19. The opportunity to ask questions was provided.

## 2021-02-24 NOTE — Anesthesia Preprocedure Evaluation (Addendum)
Anesthesia Evaluation  Patient identified by MRN, date of birth, ID band Patient awake    Reviewed: Allergy & Precautions, NPO status , Patient's Chart, lab work & pertinent test results  History of Anesthesia Complications Negative for: history of anesthetic complications  Airway Mallampati: II  TM Distance: >3 FB Neck ROM: Full    Dental  (+) Dental Advisory Given, Teeth Intact, Caps, Partial Lower   Pulmonary neg pulmonary ROS,  02/28/2021 SARS coronavirus NEG   breath sounds clear to auscultation       Cardiovascular (-) angina(-) CAD + Valvular Problems/Murmurs (now s/p TAVR) AS  Rhythm:Regular Rate:Normal  11/2020 Echo: 1. EF 60-65%. The LV has normal function, no regional wall motion abnormalities. 2. RVF normal, with normal pulmonary artery systolic pressure.  3. The mitral valve is rheumatic. Trivial MR. Mild MS with mean grad 51mmHg at 82 bpm, MVA 2.0 cm^2 by PHT  4. The inferior vena cava is normal in size with greater than 50% respiratory variability, suggesting right atrial pressure of 3 mmHg.  5. There is a 23 mm Edwards Sapien prosthetic, stented (TAVR) valve present in the aortic position. AI is trivial, normal structure and function of the aortic valve prosthesis. Vmax 2.3 m/s, mean grad 12 mmHg   Neuro/Psych Anxiety R arm weakness    GI/Hepatic Neg liver ROS, GERD  Medicated and Controlled,  Endo/Other  negative endocrine ROS  Renal/GU negative Renal ROS     Musculoskeletal   Abdominal   Peds  Hematology negative hematology ROS (+)   Anesthesia Other Findings   Reproductive/Obstetrics                            Anesthesia Physical Anesthesia Plan  ASA: 3  Anesthesia Plan: General   Post-op Pain Management: Tylenol PO (pre-op)*   Induction: Intravenous  PONV Risk Score and Plan: 3 and Ondansetron, Dexamethasone and Treatment may vary due to age or medical  condition  Airway Management Planned: Oral ETT  Additional Equipment: None  Intra-op Plan:   Post-operative Plan: Extubation in OR  Informed Consent: I have reviewed the patients History and Physical, chart, labs and discussed the procedure including the risks, benefits and alternatives for the proposed anesthesia with the patient or authorized representative who has indicated his/her understanding and acceptance.     Dental advisory given  Plan Discussed with: CRNA and Surgeon  Anesthesia Plan Comments: (See APP note by Durel Salts, FNP )      Anesthesia Quick Evaluation

## 2021-02-24 NOTE — Progress Notes (Signed)
Anesthesia Chart Review:   Case: [id number] Date/Time: 02/28/21 1222   Procedure: C4-5 C5-6 ACDF - 3C/RM 19   Anesthesia type: General   Pre-op diagnosis: Cervical radiculopathy   Location: [facility] OR ROOM 19 / [facility]   Surgeons: Karsten Ro, DO       DISCUSSION: Pt is 71 years old with hx severe AS s/p TAVR 11/01/20  VS: BP 123/68    Pulse 84    Temp 37 C (Oral)    Resp 18    Ht 5\' 3"  (1.6 m)    Wt 76.8 kg    SpO2 98%    BMI 29.99 kg/m   PROVIDERS: - PCP is Neale Burly, MD - Cardiologist is Rozann Lesches, MD. Last officev isit 12/07/20 with Angelena Form, PA. Cleared for surgery 02/08/21 at acceptable risk by Almyra Deforest, PA   LABS: Labs reviewed: Acceptable for surgery. (all labs ordered are listed, but only abnormal results are displayed)  Labs Reviewed  SURGICAL PCR SCREEN  CBC  BASIC METABOLIC PANEL  TYPE AND SCREEN     IMAGES: CXR 10/28/20:  - No active cardiopulmonary disease   EKG 11/09/20: NSR   CV: Echo 12/07/20: 1. Left ventricular ejection fraction, by estimation, is 60 to 65%. The left ventricle has normal function. The left ventricle has no regional wall motion abnormalities. Left ventricular diastolic parameters are indeterminate.   2. Right ventricular systolic function is normal. The right ventricular size is normal. There is normal pulmonary artery systolic pressure.   3. The mitral valve is rheumatic. Trivial mitral valve regurgitation. Mild mitral stenosis. MG 110mmHg at 82 bpm, MVA 2.0 cm^2 by PHT   4. The inferior vena cava is normal in size with greater than 50% respiratory variability, suggesting right atrial pressure of 3 mmHg.   5. There is a 23 mm Edwards Sapien prosthetic, stented (TAVR) valve present in the aortic position. Aortic valve regurgitation is trivial. Echo findings are consistent  with normal structure and function of the aortic valve prosthesis. Vmax 2.3 m/s, MG 12 mmHg, DI 0.41    Carotid duplex 09/19/20:  - Right Carotid: The  extracranial vessels were near-normal with only minimal wall thickening or plaque.  - Left Carotid: The extracranial vessels were near-normal with only minimal wall thickening or plaque.  - Vertebrals:  Bilateral vertebral arteries demonstrate antegrade flow.  - Subclavians: Normal flow hemodynamics were seen in bilateral subclavian arteries.   Cardiac cath 09/05/20:  1.  Angiographically normal coronary arteries. 2.  Calcification of the aortic valve with restricted leaflet opening, hemodynamic findings consistent with moderately severe aortic stenosis with peak to peak gradient 29 mmHg, mean gradient 22 mmHg, calculated valve area 1.1 cm 3.  Normal right heart pressures   Past Medical History:  Diagnosis Date   Anxiety    Arthritis    COVID-26 July 2020   GERD (gastroesophageal reflux disease)    Recurrent UTI    S/P TAVR (transcatheter aortic valve replacement) 11/01/2020   Edwards 67mm S3U TF approach with Dr. Lupe Carney    Past Surgical History:  Procedure Laterality Date   ABDOMINAL HYSTERECTOMY  1994   TAH BSO   CARDIAC CATHETERIZATION     CARPAL TUNNEL RELEASE Bilateral    RIGHT/LEFT HEART CATH AND CORONARY ANGIOGRAPHY N/A 09/05/2020   Procedure: RIGHT/LEFT HEART CATH AND CORONARY ANGIOGRAPHY;  Surgeon: Sherren Mocha, MD;  Location: [facility];  Service: Cardiovascular;  Laterality: N/A;   ROTATOR CUFF REPAIR  Right    TEE WITHOUT CARDIOVERSION N/A 11/01/2020   Procedure: TRANSESOPHAGEAL ECHOCARDIOGRAM (TEE);  Surgeon: Sherren Mocha, MD;  Location: [facility];  Service: Open Heart Surgery;  Laterality: N/A;   TRANSCATHETER AORTIC VALVE REPLACEMENT, TRANSFEMORAL N/A 11/01/2020   Procedure: TRANSCATHETER AORTIC VALVE REPLACEMENT, TRANSFEMORAL;  Surgeon: Sherren Mocha, MD;  Location: [facility];  Service: Open Heart Surgery;  Laterality: N/A;   TUBAL LIGATION      MEDICATIONS:  acetaminophen (TYLENOL) 500 MG tablet   ALPRAZolam (XANAX) 0.25 MG tablet    amoxicillin (AMOXIL) 500 MG tablet   Ascorbic Acid (VITAMIN C PO)   aspirin 81 MG chewable tablet   celecoxib (CELEBREX) 100 MG capsule   Cholecalciferol (VITAMIN D3) 50 MCG (2000 UT) TABS   fexofenadine (ALLEGRA) 180 MG tablet   Misc Natural Products (YEAST BALANCE ICS) CAPS   pantoprazole (PROTONIX) 40 MG tablet   traMADol (ULTRAM) 50 MG tablet   triamcinolone (NASACORT) 55 MCG/ACT AERO nasal inhaler   Zinc 50 MG TABS    0.9 %  sodium chloride infusion    If no changes, I anticipate pt can proceed with surgery as scheduled.   Willeen Cass, PhD, FNP-BC [facility]/Anesthesiology Phone: [phone] 02/24/2021 2:31 PM

## 2021-02-28 ENCOUNTER — Ambulatory Visit (HOSPITAL_COMMUNITY): Payer: Medicare Other

## 2021-02-28 ENCOUNTER — Other Ambulatory Visit: Payer: Self-pay

## 2021-02-28 ENCOUNTER — Encounter (HOSPITAL_COMMUNITY)
Admission: RE | Disposition: A | Discharge status: Home / Self Care | Payer: Self-pay | Source: Ambulatory Visit | Attending: Neurological Surgery

## 2021-02-28 ENCOUNTER — Encounter (HOSPITAL_COMMUNITY): Payer: Self-pay | Admitting: Neurological Surgery

## 2021-02-28 ENCOUNTER — Ambulatory Visit (HOSPITAL_COMMUNITY): Payer: Medicare Other | Admitting: Emergency Medicine

## 2021-02-28 ENCOUNTER — Observation Stay (HOSPITAL_COMMUNITY)
Admission: RE | Admit: 2021-02-28 | Discharge: 2021-03-01 | Disposition: A | Discharge status: Home / Self Care | Payer: Medicare Other | Source: Ambulatory Visit | Attending: Neurological Surgery | Admitting: Neurological Surgery

## 2021-02-28 ENCOUNTER — Ambulatory Visit (HOSPITAL_BASED_OUTPATIENT_CLINIC_OR_DEPARTMENT_OTHER): Payer: Medicare Other | Admitting: Certified Registered Nurse Anesthetist

## 2021-02-28 DIAGNOSIS — M50121 Cervical disc disorder at C4-C5 level with radiculopathy: Principal | ICD-10-CM | POA: Insufficient documentation

## 2021-02-28 DIAGNOSIS — F419 Anxiety disorder, unspecified: Secondary | ICD-10-CM

## 2021-02-28 DIAGNOSIS — Z952 Presence of prosthetic heart valve: Secondary | ICD-10-CM | POA: Insufficient documentation

## 2021-02-28 DIAGNOSIS — Z7982 Long term (current) use of aspirin: Secondary | ICD-10-CM | POA: Diagnosis not present

## 2021-02-28 DIAGNOSIS — I350 Nonrheumatic aortic (valve) stenosis: Secondary | ICD-10-CM | POA: Diagnosis not present

## 2021-02-28 DIAGNOSIS — Z20822 Contact with and (suspected) exposure to covid-19: Secondary | ICD-10-CM | POA: Insufficient documentation

## 2021-02-28 DIAGNOSIS — Z8616 Personal history of COVID-19: Secondary | ICD-10-CM | POA: Insufficient documentation

## 2021-02-28 DIAGNOSIS — M50122 Cervical disc disorder at C5-C6 level with radiculopathy: Secondary | ICD-10-CM | POA: Insufficient documentation

## 2021-02-28 DIAGNOSIS — M4322 Fusion of spine, cervical region: Secondary | ICD-10-CM | POA: Diagnosis not present

## 2021-02-28 DIAGNOSIS — Z419 Encounter for procedure for purposes other than remedying health state, unspecified: Secondary | ICD-10-CM

## 2021-02-28 DIAGNOSIS — M5412 Radiculopathy, cervical region: Secondary | ICD-10-CM | POA: Diagnosis present

## 2021-02-28 DIAGNOSIS — K219 Gastro-esophageal reflux disease without esophagitis: Secondary | ICD-10-CM

## 2021-02-28 DIAGNOSIS — M4722 Other spondylosis with radiculopathy, cervical region: Secondary | ICD-10-CM | POA: Diagnosis not present

## 2021-02-28 HISTORY — PX: ANTERIOR CERVICAL DECOMP/DISCECTOMY FUSION: SHX1161

## 2021-02-28 LAB — PROTIME-INR
INR: 1.1 (ref 0.8–1.2)
Prothrombin Time: 13.9 seconds (ref 11.4–15.2)

## 2021-02-28 LAB — APTT: aPTT: 33 seconds (ref 24–36)

## 2021-02-28 LAB — SARS CORONAVIRUS 2 BY RT PCR (HOSPITAL ORDER, PERFORMED IN ~~LOC~~ HOSPITAL LAB): SARS Coronavirus 2: NEGATIVE

## 2021-02-28 SURGERY — ANTERIOR CERVICAL DECOMPRESSION/DISCECTOMY FUSION 2 LEVELS
Anesthesia: General | Site: Spine Cervical

## 2021-02-28 MED ORDER — CEFAZOLIN SODIUM-DEXTROSE 2-4 GM/100ML-% IV SOLN
2.0000 g | Freq: Three times a day (TID) | INTRAVENOUS | Status: AC
  Administered 2021-02-28 – 2021-03-01 (×2): 2 g via INTRAVENOUS
  Filled 2021-02-28 (×2): qty 100

## 2021-02-28 MED ORDER — SUGAMMADEX SODIUM 200 MG/2ML IV SOLN
INTRAVENOUS | Status: DC | PRN
  Administered 2021-02-28: 200 mg via INTRAVENOUS

## 2021-02-28 MED ORDER — THROMBIN 5000 UNITS EX SOLR
OROMUCOSAL | Status: DC | PRN
  Administered 2021-02-28: 5 mL

## 2021-02-28 MED ORDER — FENTANYL CITRATE (PF) 250 MCG/5ML IJ SOLN
INTRAMUSCULAR | Status: AC
  Filled 2021-02-28: qty 5

## 2021-02-28 MED ORDER — LACTATED RINGERS IV SOLN: INTRAVENOUS | Status: DC

## 2021-02-28 MED ORDER — ALPRAZOLAM 0.25 MG PO TABS: 0.1250 mg | ORAL_TABLET | Freq: Every day | ORAL | Status: DC | PRN

## 2021-02-28 MED ORDER — DEXAMETHASONE SODIUM PHOSPHATE 10 MG/ML IJ SOLN
INTRAMUSCULAR | Status: AC
  Filled 2021-02-28: qty 1

## 2021-02-28 MED ORDER — PROMETHAZINE HCL 25 MG/ML IJ SOLN: 6.2500 mg | INTRAMUSCULAR | Status: DC | PRN

## 2021-02-28 MED ORDER — DOCUSATE SODIUM 100 MG PO CAPS
100.0000 mg | ORAL_CAPSULE | Freq: Two times a day (BID) | ORAL | Status: DC
  Administered 2021-02-28: 100 mg via ORAL
  Filled 2021-02-28: qty 1

## 2021-02-28 MED ORDER — ACETAMINOPHEN 500 MG PO TABS: 1000.0000 mg | ORAL_TABLET | Freq: Once | ORAL | Status: DC

## 2021-02-28 MED ORDER — ONDANSETRON HCL 4 MG/2ML IJ SOLN
INTRAMUSCULAR | Status: DC | PRN
  Administered 2021-02-28: 4 mg via INTRAVENOUS

## 2021-02-28 MED ORDER — HYDROMORPHONE HCL 1 MG/ML IJ SOLN
INTRAMUSCULAR | Status: AC
  Filled 2021-02-28: qty 1

## 2021-02-28 MED ORDER — PROPOFOL 10 MG/ML IV BOLUS
INTRAVENOUS | Status: DC | PRN
  Administered 2021-02-28: 120 mg via INTRAVENOUS

## 2021-02-28 MED ORDER — DEXAMETHASONE SODIUM PHOSPHATE 10 MG/ML IJ SOLN
INTRAMUSCULAR | Status: DC | PRN
  Administered 2021-02-28: 5 mg via INTRAVENOUS

## 2021-02-28 MED ORDER — MIDAZOLAM HCL 2 MG/2ML IJ SOLN: 0.5000 mg | Freq: Once | INTRAMUSCULAR | Status: DC | PRN

## 2021-02-28 MED ORDER — ROCURONIUM BROMIDE 10 MG/ML (PF) SYRINGE
PREFILLED_SYRINGE | INTRAVENOUS | Status: DC | PRN
  Administered 2021-02-28: 30 mg via INTRAVENOUS
  Administered 2021-02-28: 60 mg via INTRAVENOUS
  Administered 2021-02-28: 10 mg via INTRAVENOUS

## 2021-02-28 MED ORDER — TRIAMCINOLONE ACETONIDE 40 MG/ML IJ SUSP
40.0000 mg | INTRAMUSCULAR | Status: AC
  Administered 2021-02-28: 40 mg via INTRAMUSCULAR
  Filled 2021-02-28: qty 1

## 2021-02-28 MED ORDER — SODIUM CHLORIDE 0.9% FLUSH: 3.0000 mL | Freq: Two times a day (BID) | INTRAVENOUS | Status: DC

## 2021-02-28 MED ORDER — MEPERIDINE HCL 25 MG/ML IJ SOLN: 6.2500 mg | INTRAMUSCULAR | Status: DC | PRN

## 2021-02-28 MED ORDER — CEFAZOLIN SODIUM-DEXTROSE 2-4 GM/100ML-% IV SOLN
2.0000 g | INTRAVENOUS | Status: AC
  Administered 2021-02-28: 2 g via INTRAVENOUS
  Filled 2021-02-28: qty 100

## 2021-02-28 MED ORDER — EPHEDRINE SULFATE-NACL 50-0.9 MG/10ML-% IV SOSY
PREFILLED_SYRINGE | INTRAVENOUS | Status: DC | PRN
  Administered 2021-02-28: 5 mg via INTRAVENOUS

## 2021-02-28 MED ORDER — HYDROCODONE-ACETAMINOPHEN 5-325 MG PO TABS: 2.0000 | ORAL_TABLET | ORAL | Status: DC | PRN

## 2021-02-28 MED ORDER — SODIUM CHLORIDE 0.9% FLUSH: 3.0000 mL | INTRAVENOUS | Status: DC | PRN

## 2021-02-28 MED ORDER — PROPOFOL 10 MG/ML IV BOLUS
INTRAVENOUS | Status: AC
  Filled 2021-02-28: qty 20

## 2021-02-28 MED ORDER — FENTANYL CITRATE (PF) 250 MCG/5ML IJ SOLN
INTRAMUSCULAR | Status: DC | PRN
  Administered 2021-02-28: 100 ug via INTRAVENOUS
  Administered 2021-02-28 (×3): 50 ug via INTRAVENOUS

## 2021-02-28 MED ORDER — ALPRAZOLAM 0.25 MG PO TABS
0.1250 mg | ORAL_TABLET | Freq: Every day | ORAL | Status: DC | PRN
  Filled 2021-02-28: qty 1

## 2021-02-28 MED ORDER — PHENYLEPHRINE 40 MCG/ML (10ML) SYRINGE FOR IV PUSH (FOR BLOOD PRESSURE SUPPORT)
PREFILLED_SYRINGE | INTRAVENOUS | Status: AC
  Filled 2021-02-28: qty 10

## 2021-02-28 MED ORDER — THROMBIN 5000 UNITS EX SOLR
CUTANEOUS | Status: DC | PRN
  Administered 2021-02-28 (×2): 5000 [IU] via TOPICAL

## 2021-02-28 MED ORDER — ONDANSETRON HCL 4 MG/2ML IJ SOLN: 4.0000 mg | Freq: Four times a day (QID) | INTRAMUSCULAR | Status: DC | PRN

## 2021-02-28 MED ORDER — EPHEDRINE 5 MG/ML INJ
INTRAVENOUS | Status: AC
  Filled 2021-02-28: qty 5

## 2021-02-28 MED ORDER — HYDROMORPHONE HCL 1 MG/ML IJ SOLN
0.2500 mg | INTRAMUSCULAR | Status: DC | PRN
  Administered 2021-02-28 (×3): 0.5 mg via INTRAVENOUS

## 2021-02-28 MED ORDER — MENTHOL 3 MG MT LOZG: 1.0000 | LOZENGE | OROMUCOSAL | Status: DC | PRN

## 2021-02-28 MED ORDER — SODIUM CHLORIDE 0.9 % IV SOLN
250.0000 mL | INTRAVENOUS | Status: DC
  Administered 2021-02-28: 250 mL via INTRAVENOUS

## 2021-02-28 MED ORDER — THROMBIN 5000 UNITS EX SOLR
CUTANEOUS | Status: AC
  Filled 2021-02-28: qty 15000

## 2021-02-28 MED ORDER — CHLORHEXIDINE GLUCONATE 0.12 % MT SOLN
15.0000 mL | Freq: Once | OROMUCOSAL | Status: AC
  Administered 2021-02-28: 15 mL via OROMUCOSAL
  Filled 2021-02-28: qty 15

## 2021-02-28 MED ORDER — METHOCARBAMOL 500 MG PO TABS
500.0000 mg | ORAL_TABLET | Freq: Four times a day (QID) | ORAL | Status: DC | PRN
  Administered 2021-02-28 – 2021-03-01 (×2): 500 mg via ORAL
  Filled 2021-02-28 (×2): qty 1

## 2021-02-28 MED ORDER — PANTOPRAZOLE SODIUM 40 MG PO TBEC
40.0000 mg | DELAYED_RELEASE_TABLET | Freq: Every day | ORAL | Status: DC | PRN
Start: 2021-02-28 — End: 2021-03-01

## 2021-02-28 MED ORDER — OXYCODONE HCL 5 MG/5ML PO SOLN: 5.0000 mg | Freq: Once | ORAL | Status: DC | PRN

## 2021-02-28 MED ORDER — HYDROCODONE-ACETAMINOPHEN 5-325 MG PO TABS
1.0000 | ORAL_TABLET | ORAL | Status: DC | PRN
  Administered 2021-02-28 – 2021-03-01 (×2): 1 via ORAL
  Filled 2021-02-28 (×2): qty 1

## 2021-02-28 MED ORDER — MORPHINE SULFATE (PF) 2 MG/ML IV SOLN: 2.0000 mg | INTRAVENOUS | Status: DC | PRN

## 2021-02-28 MED ORDER — PHENYLEPHRINE 40 MCG/ML (10ML) SYRINGE FOR IV PUSH (FOR BLOOD PRESSURE SUPPORT)
PREFILLED_SYRINGE | INTRAVENOUS | Status: DC | PRN
  Administered 2021-02-28: 120 ug via INTRAVENOUS
  Administered 2021-02-28: 80 ug via INTRAVENOUS

## 2021-02-28 MED ORDER — TRIAMCINOLONE ACETONIDE 55 MCG/ACT NA AERO
2.0000 | INHALATION_SPRAY | Freq: Every day | NASAL | Status: DC | PRN
  Filled 2021-02-28: qty 21.6

## 2021-02-28 MED ORDER — METHOCARBAMOL 1000 MG/10ML IJ SOLN
500.0000 mg | Freq: Four times a day (QID) | INTRAVENOUS | Status: DC | PRN
  Filled 2021-02-28: qty 5

## 2021-02-28 MED ORDER — PHENOL 1.4 % MT LIQD: 1.0000 | OROMUCOSAL | Status: DC | PRN

## 2021-02-28 MED ORDER — PHENYLEPHRINE HCL-NACL 20-0.9 MG/250ML-% IV SOLN
INTRAVENOUS | Status: DC | PRN
  Administered 2021-02-28: 25 ug/min via INTRAVENOUS

## 2021-02-28 MED ORDER — OXYCODONE HCL 5 MG PO TABS: 5.0000 mg | ORAL_TABLET | Freq: Once | ORAL | Status: DC | PRN

## 2021-02-28 MED ORDER — ONDANSETRON HCL 4 MG PO TABS: 4.0000 mg | ORAL_TABLET | Freq: Four times a day (QID) | ORAL | Status: DC | PRN

## 2021-02-28 MED ORDER — ONDANSETRON HCL 4 MG/2ML IJ SOLN
INTRAMUSCULAR | Status: AC
  Filled 2021-02-28: qty 2

## 2021-02-28 MED ORDER — ACETAMINOPHEN 650 MG RE SUPP: 650.0000 mg | RECTAL | Status: DC | PRN

## 2021-02-28 MED ORDER — ACETAMINOPHEN 325 MG PO TABS: 650.0000 mg | ORAL_TABLET | ORAL | Status: DC | PRN

## 2021-02-28 MED ORDER — CHLORHEXIDINE GLUCONATE CLOTH 2 % EX PADS: 6.0000 | MEDICATED_PAD | Freq: Once | CUTANEOUS | Status: DC

## 2021-02-28 MED ORDER — ORAL CARE MOUTH RINSE: 15.0000 mL | Freq: Once | OROMUCOSAL | Status: AC

## 2021-02-28 MED ORDER — LIDOCAINE 2% (20 MG/ML) 5 ML SYRINGE
INTRAMUSCULAR | Status: DC | PRN
  Administered 2021-02-28: 40 mg via INTRAVENOUS

## 2021-02-28 MED ORDER — 0.9 % SODIUM CHLORIDE (POUR BTL) OPTIME
TOPICAL | Status: DC | PRN
  Administered 2021-02-28: 1000 mL

## 2021-02-28 SURGICAL SUPPLY — 61 items
BAG COUNTER SPONGE SURGICOUNT (BAG) ×2 IMPLANT
BAND RUBBER #18 3X1/16 STRL (MISCELLANEOUS) ×4 IMPLANT
BENZOIN TINCTURE PRP APPL 2/3 (GAUZE/BANDAGES/DRESSINGS) IMPLANT
BIT DRILL NEURO 2X3.1 SFT TUCH (MISCELLANEOUS) ×1 IMPLANT
BLADE CLIPPER SURG (BLADE) IMPLANT
BUR CARBIDE MATCH 3.0 (BURR) ×2 IMPLANT
CANISTER SUCT 3000ML PPV (MISCELLANEOUS) ×2 IMPLANT
CARTRIDGE OIL MAESTRO DRILL (MISCELLANEOUS) ×1 IMPLANT
COVER MAYO STAND STRL (DRAPES) ×4 IMPLANT
DIFFUSER DRILL AIR PNEUMATIC (MISCELLANEOUS) ×2 IMPLANT
DRAPE C-ARM 42X72 X-RAY (DRAPES) ×2 IMPLANT
DRAPE HALF SHEET 40X57 (DRAPES) IMPLANT
DRAPE LAPAROTOMY 100X72X124 (DRAPES) ×2 IMPLANT
DRAPE MICROSCOPE LEICA (MISCELLANEOUS) ×2 IMPLANT
DRILL NEURO 2X3.1 SOFT TOUCH (MISCELLANEOUS) ×2
DURAPREP 6ML APPLICATOR 50/CS (WOUND CARE) ×2 IMPLANT
ELECT COATED BLADE 2.86 ST (ELECTRODE) ×2 IMPLANT
ELECT REM PT RETURN 9FT ADLT (ELECTROSURGICAL) ×2
ELECTRODE REM PT RTRN 9FT ADLT (ELECTROSURGICAL) ×1 IMPLANT
EVACUATOR 1/8 PVC DRAIN (DRAIN) IMPLANT
GAUZE 4X4 16PLY ~~LOC~~+RFID DBL (SPONGE) IMPLANT
GAUZE SPONGE 4X4 12PLY STRL (GAUZE/BANDAGES/DRESSINGS) ×1 IMPLANT
GLOVE EXAM NITRILE LRG STRL (GLOVE) IMPLANT
GLOVE EXAM NITRILE XL STR (GLOVE) IMPLANT
GLOVE EXAM NITRILE XS STR PU (GLOVE) IMPLANT
GLOVE SRG 8 PF TXTR STRL LF DI (GLOVE) ×1 IMPLANT
GLOVE SURG LTX SZ8 (GLOVE) ×4 IMPLANT
GLOVE SURG UNDER POLY LF SZ8 (GLOVE) ×1
GOWN STRL REUS W/ TWL LRG LVL3 (GOWN DISPOSABLE) IMPLANT
GOWN STRL REUS W/ TWL XL LVL3 (GOWN DISPOSABLE) ×1 IMPLANT
GOWN STRL REUS W/TWL 2XL LVL3 (GOWN DISPOSABLE) IMPLANT
GOWN STRL REUS W/TWL LRG LVL3 (GOWN DISPOSABLE)
GOWN STRL REUS W/TWL XL LVL3 (GOWN DISPOSABLE) ×1
HEMOSTAT POWDER KIT SURGIFOAM (HEMOSTASIS) ×2 IMPLANT
IMPL CERV LORD 12X14X6 7D (Neuro Prosthesis/Implant) IMPLANT
IMPLANT CERV LORD 12X14X6 7D (Neuro Prosthesis/Implant) ×2 IMPLANT
KIT BASIN OR (CUSTOM PROCEDURE TRAY) ×2 IMPLANT
KIT TURNOVER KIT B (KITS) ×2 IMPLANT
NDL SPNL 18GX3.5 QUINCKE PK (NEEDLE) ×1 IMPLANT
NEEDLE HYPO 22GX1.5 SAFETY (NEEDLE) ×2 IMPLANT
NEEDLE SPNL 18GX3.5 QUINCKE PK (NEEDLE) ×2 IMPLANT
NS IRRIG 1000ML POUR BTL (IV SOLUTION) ×2 IMPLANT
OIL CARTRIDGE MAESTRO DRILL (MISCELLANEOUS) ×2
PACK LAMINECTOMY NEURO (CUSTOM PROCEDURE TRAY) ×2 IMPLANT
PAD ARMBOARD 7.5X6 YLW CONV (MISCELLANEOUS) ×6 IMPLANT
PIN DISTRACTION 14MM (PIN) ×4 IMPLANT
PLATE CERV CONS OZARK 2X36 (Plate) ×1 IMPLANT
PUTTY BONE 100 VESUVIUS 2.5CC (Putty) ×1 IMPLANT
SCREW FIXED ST OZARK 4X16 (Screw) ×2 IMPLANT
SCREW VA ST OZARK 4X14 (Screw) ×2 IMPLANT
SCREW VA ST OZARK 4X16 (Plate) ×2 IMPLANT
SPACER LORD CASC 14X12X7 7D (Spacer) ×1 IMPLANT
SPONGE INTESTINAL PEANUT (DISPOSABLE) ×2 IMPLANT
SPONGE SURGIFOAM ABS GEL SZ50 (HEMOSTASIS) ×2 IMPLANT
STAPLER VISISTAT 35W (STAPLE) IMPLANT
STRIP CLOSURE SKIN 1/2X4 (GAUZE/BANDAGES/DRESSINGS) ×2 IMPLANT
TAPE SURG TRANSPORE 1 IN (GAUZE/BANDAGES/DRESSINGS) ×1 IMPLANT
TAPE SURGICAL TRANSPORE 1 IN (GAUZE/BANDAGES/DRESSINGS) ×2
TOWEL GREEN STERILE (TOWEL DISPOSABLE) ×2 IMPLANT
TOWEL GREEN STERILE FF (TOWEL DISPOSABLE) ×2 IMPLANT
WATER STERILE IRR 1000ML POUR (IV SOLUTION) ×2 IMPLANT

## 2021-02-28 NOTE — H&P (Signed)
Providing Compassionate, Quality Care - Together  NEUROSURGERY HISTORY & PHYSICAL   Grace Thomas is an 71 y.o. female.   Chief Complaint: Right upper extremity radiculopathy and neck pain HPI: This is a 71 year old female with complaints of cervicalgia and right greater than left C5 and C6 radiculopathy.  Imaging revealed bilateral neuroforaminal narrowing at C4-5 and C5-6.  She presents today for ACDF C 4-6.  Past Medical History:  Diagnosis Date   Anxiety    Arthritis    COVID-26 July 2020   GERD (gastroesophageal reflux disease)    Recurrent UTI    S/P TAVR (transcatheter aortic valve replacement) 11/01/2020   Edwards 93mm S3U TF approach with Dr. Lupe Carney    Past Surgical History:  Procedure Laterality Date   ABDOMINAL HYSTERECTOMY  1994   TAH BSO   CARDIAC CATHETERIZATION     CARPAL TUNNEL RELEASE Bilateral    RIGHT/LEFT HEART CATH AND CORONARY ANGIOGRAPHY N/A 09/05/2020   Procedure: RIGHT/LEFT HEART CATH AND CORONARY ANGIOGRAPHY;  Surgeon: Sherren Mocha, MD;  Location: [facility];  Service: Cardiovascular;  Laterality: N/A;   ROTATOR CUFF REPAIR Right    TEE WITHOUT CARDIOVERSION N/A 11/01/2020   Procedure: TRANSESOPHAGEAL ECHOCARDIOGRAM (TEE);  Surgeon: Sherren Mocha, MD;  Location: [facility];  Service: Open Heart Surgery;  Laterality: N/A;   TRANSCATHETER AORTIC VALVE REPLACEMENT, TRANSFEMORAL N/A 11/01/2020   Procedure: TRANSCATHETER AORTIC VALVE REPLACEMENT, TRANSFEMORAL;  Surgeon: Sherren Mocha, MD;  Location: [facility];  Service: Open Heart Surgery;  Laterality: N/A;   TUBAL LIGATION      Family History  Problem Relation Age of Onset   Heart failure Mother    Stroke Father    Bone cancer Sister    Pancreatic cancer Sister    Colitis Daughter    Social History:  reports that she has never smoked. She has never used smokeless tobacco. She reports that she does not drink alcohol and does not use drugs.  Allergies: No Known  Allergies  Facility-Administered Medications Prior to Admission  Medication Dose Route Frequency Provider Last Rate Last Admin   0.9 %  sodium chloride infusion  500 mL Intravenous Once Doran Stabler, MD       Medications Prior to Admission  Medication Sig Dispense Refill   acetaminophen (TYLENOL) 500 MG tablet Take 1,000 mg by mouth every 4 (four) hours as needed for moderate pain or mild pain.     ALPRAZolam (XANAX) 0.25 MG tablet Take 0.125 mg by mouth daily as needed for anxiety (anxiety/nerves).     amoxicillin (AMOXIL) 500 MG tablet Take 1 tablet (500 mg total) by mouth as directed. Take 4 tablets by mouth 1 hour prior to dental procedures and cleanings 12 tablet 6   Ascorbic Acid (VITAMIN C PO) Take 2 tablets by mouth every evening.     Cholecalciferol (VITAMIN D3) 50 MCG (2000 UT) TABS Take 2,000 Units by mouth every evening.     miconazole (MONISTAT 7) 2 % vaginal cream Place 1 Applicatorful vaginally at bedtime as needed (yeast infection).     Misc Natural Products (YEAST BALANCE ICS) CAPS Take 1 capsule by mouth every evening. AZO     pantoprazole (PROTONIX) 40 MG tablet Take 1 tablet (40 mg total) by mouth daily. (Patient taking differently: Take 40 mg by mouth daily as needed (acid reflux).) 60 tablet 3   traMADol (ULTRAM) 50 MG tablet Take 50 mg by mouth daily as needed (pain.).  triamcinolone (NASACORT) 55 MCG/ACT AERO nasal inhaler Place 2 sprays into the nose daily as needed (allergies.).     Zinc 50 MG TABS Take 50 mg by mouth every evening.     aspirin 81 MG chewable tablet Chew 1 tablet (81 mg total) by mouth daily.     fexofenadine (ALLEGRA) 180 MG tablet Take 180 mg by mouth daily as needed for allergies or rhinitis.     fluconazole (DIFLUCAN) 150 MG tablet Take 150 mg by mouth every other day.      Results for orders placed or performed during the hospital encounter of 02/28/21 (from the past 48 hour(s))  SARS Coronavirus 2 by RT PCR (hospital order, performed  in [place] hospital lab) Nasopharyngeal Nasopharyngeal Swab     Status: None   Collection Time: 02/28/21  9:23 AM   Specimen: Nasopharyngeal Swab  Result Value Ref Range   SARS Coronavirus 2 NEGATIVE NEGATIVE    Comment: (NOTE) SARS-CoV-2 target nucleic acids are NOT DETECTED.  The SARS-CoV-2 RNA is generally detectable in upper and lower respiratory specimens during the acute phase of infection. The lowest concentration of SARS-CoV-2 viral copies this assay can detect is 250 copies / mL. A negative result does not preclude SARS-CoV-2 infection and should not be used as the sole basis for treatment or other patient management decisions.  A negative result may occur with improper specimen collection / handling, submission of specimen other than nasopharyngeal swab, presence of viral mutation(s) within the areas targeted by this assay, and inadequate number of viral copies (<250 copies / mL). A negative result must be combined with clinical observations, patient history, and epidemiological information.  Fact Sheet for Patients:   StrictlyIdeas.no  Fact Sheet for Healthcare Providers: BankingDealers.co.za  This test is not yet approved or  cleared by the [place] FDA and has been authorized for detection and/or diagnosis of SARS-CoV-2 by FDA under an Emergency Use Authorization (EUA).  This EUA will remain in effect (meaning this test can be used) for the duration of the COVID-19 declaration under Section 564(b)(1) of the Act, 21 U.S.C. section 360bbb-3(b)(1), unless the authorization is terminated or revoked sooner.  Performed at [facility], [place] [address] [place], [place] [address]   APTT     Status: None   Collection Time: 02/28/21 10:07 AM  Result Value Ref Range   aPTT 33 24 - 36 seconds    Comment: Performed at [facility] [address], [place], [place] [address]  Protime - INR     Status: None    Collection Time: 02/28/21 10:07 AM  Result Value Ref Range   Prothrombin Time 13.9 11.4 - 15.2 seconds   INR 1.1 0.8 - 1.2    Comment: (NOTE) INR goal varies based on device and disease states. Performed at [facility], [place] [address], [place],  [address]    No results found.  ROS All pertinent positives and negatives are listed in HPI above  Blood pressure 121/73, pulse 81, temperature 98.3 F (36.8 C), temperature source Oral, resp. rate 18, height 5\' 3"  (1.6 m), weight 77.1 kg, SpO2 94 %. Physical Exam  Awake alert oriented x3, no acute distress PERRLA EOMI Cranial nerves II through XII intact Bilateral upper extremity 4+/5 throughout Bilateral lower extremity 5/5 throughout  decreased sensation to light touch in the right upper extremity C6 distribution Speech fluent and appropriate  Assessment/Plan 71 year old female with  Cervical spondylosis with radiculopathy, C4-6  -  OR today for ACDF C4-6.  We discussed all risks, benefits, expected outcomes and alternatives to treatment.  She failed conservative measures and agrees to proceed with surgical intervention.  Informed consent obtained.   Thank you for allowing me to participate in this patient's care.  Please do not hesitate to call with questions or concerns.   Elwin Sleight, [facility] Cell: [phone]

## 2021-02-28 NOTE — Op Note (Signed)
Providing Compassionate, Quality Care - Together  Date of service: 02/28/2021  PREOP DIAGNOSIS: Cervical spondylosis with radiculopathy, C4-5, C5-6, right greater than left  POSTOP DIAGNOSIS: Same  PROCEDURE: 1. Arthrodesis C4-5, C5-6, anterior interbody technique  2. Placement of intervertebral biomechanical device C4-5, C5-6, K2 M [place] titanium interbody, C4-5: 7 x 12 x 14 mm, C5-6: 6 x 12 x 14 mm titanium interbody's 3. Placement of anterior instrumentation consisting of interbody plate and screws -K2 M [place] 36 mm plate with 4.0 x 16 mm screws in C4, 4.0 x 14 mm screws at C5, 4.0 x 16 mm at C6 4. Discectomy at C4-5, C5-6 for decompression of spinal cord and exiting nerve roots  5. Use of morselized bone allograft  6. Use of intraoperative microscope  SURGEON: Dr. Elwin Sleight, DO  ASSISTANT: Dr. Duffy Rhody, MD  ANESTHESIA: General Endotracheal  EBL: 50 cc  SPECIMENS: None  DRAINS: None  COMPLICATIONS: None immediate  CONDITION: Hemodynamically stable to PACU  HISTORY: Grace Thomas is a 71 y.o. y.o. female who initially presented to the outpatient clinic with signs and symptoms consistent with neck pain and right greater than left upper extremity C5 and C6 radiculopathy. MRI demonstrated degenerative disc disease with spondylosis and neuroforaminal narrowing at C4-5 and C5-6.  Her pain was uncontrolled despite pain control and this was altering her lifestyle. Treatment options were discussed including epidural steroid injections, physical therapy, continued pain control or surgical decompression and fusion in the form of an ACDF C4-6.  Given her failure of conservative measures, I recommended surgical intervention in the form of ACDF C4-6.  We discussed all risks, benefits and expected outcomes as well as alternatives. After all questions were answered, informed consent was obtained.  PROCEDURE IN DETAIL: The patient was brought to the operating room and  transferred to the operative table. After induction of general anesthesia, the patient was positioned on the operative table in the supine position with all pressure points meticulously padded. The skin of the neck was then prepped and draped in the usual sterile fashion.  Physician driven timeout was performed.  After timeout was conducted, skin incision was then made sharply with a 10 blade and Bovie electrocautery was used to dissect the subcutaneous tissue until the platysma was identified. The platysma was then divided and undermined. The sternocleidomastoid muscle was then identified and, utilizing natural fascial planes in the neck, the prevertebral fascia was identified and the carotid sheath was retracted laterally and the trachea and esophagus retracted medially. Again using fluoroscopy, the correct disc space was identified. Bovie electrocautery was used to dissect in the subperiosteal plane and elevate the bilateral longus coli muscles at C4, C5, C6. Self-retaining retractors were then placed under the longus coli muscles bilaterally. At this point, the microscope was draped and brought into the field, and the remainder of the case was done under the microscope using microdissecting technique.  ACDF C4-5: Distraction pins were placed in midline above and below the disc space at C4-5.  The disc  space was placed in distraction.  The disc space was incised sharply and rongeurs were use to initially complete a discectomy. The high-speed drill was then used to complete discectomy until the posterior annulus was identified and removed and the posterior longitudinal ligament was identified. Using microcurettes, the PLL was elevated, and Kerrison rongeurs were used to remove the posterior longitudinal ligament and the ventral thecal sac was identified. Using a combination of curettes and ronguers, complete decompression of the thecal  sac and exiting nerve roots at this level was completed, and verified  using micro-nerve hook. The disc space was taken out of distraction.  Epidural hemostasis was achieved with Surgifoam.  Having completed our decompression, attention was turned to placement of the intervertebral device. Trial spacers were used to select a 6mm graft. This graft was then filled with morcellized allograft, and inserted under live fluoroscopy.  ACDF C5-6: Distraction pins were placed in midline above and below the disc space at C 5-6.  The disc  space was placed in distraction.  The disc space was incised sharply and rongeurs were use to initially complete a discectomy. The high-speed drill was then used to complete discectomy until the posterior annulus was identified and removed and the posterior longitudinal ligament was identified. Using microcurettes, the PLL was elevated, and Kerrison rongeurs were used to remove the posterior longitudinal ligament and the ventral thecal sac was identified. Using a combination of curettes and ronguers, complete decompression of the thecal sac and exiting nerve roots at this level was completed, and verified using micro-nerve hook. The disc space was taken out of distraction.  Epidural hemostasis was achieved with Surgifoam.  Having completed our decompression, attention was turned to placement of the intervertebral device. Trial spacers were used to select a 6 mm graft. This graft was then filled with morcellized allograft, and inserted under live fluoroscopy.  After placement of the intervertebral devices, the above anterior cervical plate was selected, and placed across the interspace. Using a high-speed drill, the cortex of the cervical vertebral bodies was punctured, and screws inserted in C4, C5, C6. Final fluoroscopic images in AP and lateral projections were taken to confirm good hardware placement.  The plate was final tightened to the manufacturer's recommendation and the screws were locked in place.  At this point, after all counts were  verified to be correct, meticulous hemostasis was secured using a combination of bipolar electrocautery and passive hemostatics.  Skin was closed with staples.  Sterile dressing was applied.  The patient tolerated the procedure well and was extubated in the room and taken to the postanesthesia care unit in stable condition.

## 2021-02-28 NOTE — Transfer of Care (Signed)
Immediate Anesthesia Transfer of Care Note  Patient: Grace Thomas  Procedure(s) Performed: Cervical Four-Five Cervical Five-Six Anterior Cervical Decompression Fusion (Spine Cervical)  Patient Location: PACU  Anesthesia Type:General  Level of Consciousness: drowsy and patient cooperative  Airway & Oxygen Therapy: Patient Spontanous Breathing and Patient connected to nasal cannula oxygen  Post-op Assessment: Report given to RN and Post -op Vital signs reviewed and stable  Post vital signs: Reviewed and stable  Last Vitals:  Vitals Value Taken Time  BP    Temp    Pulse 80 02/28/21 1620  Resp 18 02/28/21 1620  SpO2 98 % 02/28/21 1620  Vitals shown include unvalidated device data.  Last Pain:  Vitals:   02/28/21 0950  TempSrc:   PainSc: 4       Patients Stated Pain Goal: 0 (21/19/41 7408)  Complications: No notable events documented.

## 2021-02-28 NOTE — Anesthesia Procedure Notes (Signed)
Procedure Name: Intubation Date/Time: 02/28/2021 2:06 PM Performed by: Colin Benton, CRNA Pre-anesthesia Checklist: Patient identified, Emergency Drugs available, Suction available and Patient being monitored Patient Re-evaluated:Patient Re-evaluated prior to induction Oxygen Delivery Method: Circle system utilized Preoxygenation: Pre-oxygenation with 100% oxygen Induction Type: IV induction Ventilation: Mask ventilation without difficulty Laryngoscope Size: Miller and 2 Grade View: Grade I Tube type: Oral Tube size: 7.0 mm Number of attempts: 1 Airway Equipment and Method: Stylet Placement Confirmation: ETT inserted through vocal cords under direct vision, positive ETCO2 and breath sounds checked- equal and bilateral Secured at: 22 cm Tube secured with: Tape Dental Injury: Teeth and Oropharynx as per pre-operative assessment

## 2021-02-28 NOTE — Progress Notes (Signed)
Orthopedic Tech Progress Note Patient Details:  Grace Thomas 09/21/1950 [id number]  PACU RN called requesting an [place]   Patient ID: Grace Thomas, female   DOB: 12-13-1950, 71 y.o.   MRN: [id number]  Janit Pagan 02/28/2021, 4:45 PM

## 2021-02-28 NOTE — Anesthesia Postprocedure Evaluation (Signed)
Anesthesia Post Note  Patient: Grace Thomas  Procedure(s) Performed: Cervical Four-Five Cervical Five-Six Anterior Cervical Decompression Fusion (Spine Cervical)     Patient location during evaluation: PACU Anesthesia Type: General Level of consciousness: awake and alert, patient cooperative and oriented Pain management: pain level controlled Vital Signs Assessment: post-procedure vital signs reviewed and stable Respiratory status: spontaneous breathing, nonlabored ventilation and respiratory function stable Cardiovascular status: blood pressure returned to baseline and stable Postop Assessment: no apparent nausea or vomiting and able to ambulate Anesthetic complications: no   No notable events documented.  Last Vitals:  Vitals:   02/28/21 1650 02/28/21 1705  BP: 130/64 (!) 128/56  Pulse: 82 86  Resp: 14 12  Temp:    SpO2: 97% 97%    Last Pain:  Vitals:   02/28/21 1705  TempSrc:   PainSc: Asleep                 Cartier Washko,E. Reyce Lubeck

## 2021-03-01 ENCOUNTER — Ambulatory Visit: Payer: Medicare Other | Admitting: Cardiology

## 2021-03-01 ENCOUNTER — Encounter (HOSPITAL_COMMUNITY): Payer: Self-pay | Admitting: Neurological Surgery

## 2021-03-01 DIAGNOSIS — Z8616 Personal history of COVID-19: Secondary | ICD-10-CM | POA: Diagnosis not present

## 2021-03-01 DIAGNOSIS — M50122 Cervical disc disorder at C5-C6 level with radiculopathy: Secondary | ICD-10-CM | POA: Diagnosis not present

## 2021-03-01 DIAGNOSIS — Z952 Presence of prosthetic heart valve: Secondary | ICD-10-CM | POA: Diagnosis not present

## 2021-03-01 DIAGNOSIS — M50121 Cervical disc disorder at C4-C5 level with radiculopathy: Secondary | ICD-10-CM | POA: Diagnosis not present

## 2021-03-01 DIAGNOSIS — Z7982 Long term (current) use of aspirin: Secondary | ICD-10-CM | POA: Diagnosis not present

## 2021-03-01 DIAGNOSIS — Z20822 Contact with and (suspected) exposure to covid-19: Secondary | ICD-10-CM | POA: Diagnosis not present

## 2021-03-01 MED ORDER — METHOCARBAMOL 500 MG PO TABS: 500.0000 mg | ORAL_TABLET | Freq: Four times a day (QID) | ORAL | 2 refills | Status: DC

## 2021-03-01 MED ORDER — HYDROCODONE-ACETAMINOPHEN 5-325 MG PO TABS: 1.0000 | ORAL_TABLET | ORAL | 0 refills | Status: DC | PRN

## 2021-03-01 NOTE — Care Management (Signed)
Plan for discharge to home. No HH needs identified, and unit staff will provide any DME needed from [facility]stock.

## 2021-03-01 NOTE — Plan of Care (Signed)
Patient alert and oriented, mae's well, voiding adequate amount of urine, swallowing without difficulty, no c/o pain at time of discharge. Patient discharged home with family. Script and discharged instructions given to patient. Patient and family stated understanding of instructions given. Patient has an appointment with Dr. Dawley 

## 2021-03-01 NOTE — Discharge Instructions (Signed)
Wound Care Remove outer dressing in 3 days after shower Leave incision open to air. You may shower. Do not scrub directly on incision.  Do not put any creams, lotions, or ointments on incision. Activity Walk each and every day, increasing distance each day. No lifting greater than 5 lbs.  Avoid excessive neck motion. No driving for 2 weeks; may ride as a passenger locally. Wear neck brace at all times except when showering.   Diet Resume your normal diet.   Call Your Doctor If Any of These Occur Redness, drainage, or swelling at the wound.  Temperature greater than 101 degrees. Severe pain not relieved by pain medication. Increased difficulty swallowing. Incision starts to come apart. Follow Up Appt Call today for appointment in 2-3 weeks ([phone]) or for problems.  If you have any hardware placed in your spine, you will need an x-ray before your appointment.

## 2021-03-01 NOTE — Evaluation (Signed)
Occupational Therapy Evaluation Patient Details Name: Grace Thomas MRN: [id number] DOB: 1950-12-12 Today's Date: 03/01/2021   History of Present Illness Pt is a 71 y/o female who presents s/p C4-C6 ACDF on 02/28/2021. PMH significant for TAVR 2022, R rotator cuff repair.   Clinical Impression   Pt admitted for concerns listed above. PTA Pt reported that she was independent with all ADL's and IADL's, including driving and helping out at church functions. At this time, pt is able to complete all ADL's and functional mobility with no assist, following her cervical precautions. Pt educated on compensatory strategies for ADL's.  She has no further OT needs and acute OT will sign off.       Recommendations for follow up therapy are one component of a multi-disciplinary discharge planning process, led by the attending physician.  Recommendations may be updated based on patient status, additional functional criteria and insurance authorization.   Follow Up Recommendations  No OT follow up    Assistance Recommended at Discharge Set up Supervision/Assistance  Patient can return home with the following A little help with bathing/dressing/bathroom;Assistance with cooking/housework    Functional Status Assessment  Patient has had a recent decline in their functional status and demonstrates the ability to make significant improvements in function in a reasonable and predictable amount of time.  Equipment Recommendations  None recommended by OT    Recommendations for Other Services       Precautions / Restrictions Precautions Precautions: Fall;Cervical Precaution Booklet Issued: Yes (comment) Precaution Comments: Reviewed handout and pt was cued for precautions during ADL's Required Braces or Orthoses: Cervical Brace Cervical Brace: Hard collar Restrictions Weight Bearing Restrictions: No      Mobility Bed Mobility Overal bed mobility: Modified Independent             General bed  mobility comments: HOB flat and rails lowered to simulate home environment. Pt was able to demonstrate good log roll technique    Transfers Overall transfer level: Modified independent Equipment used: None               General transfer comment: Pt was able to power up to full stand without assistance.      Balance Overall balance assessment: No apparent balance deficits (not formally assessed)                                         ADL either performed or assessed with clinical judgement   ADL Overall ADL's : Modified independent                                       General ADL Comments: Pt able to complete all BADL's with no assist, following compensatory strategies and precautions well.     Vision Baseline Vision/History: 1 Wears glasses Ability to See in Adequate Light: 0 Adequate Patient Visual Report: No change from baseline Vision Assessment?: No apparent visual deficits     Perception     Praxis      Pertinent Vitals/Pain Pain Assessment Pain Assessment: Faces Faces Pain Scale: Hurts a little bit Pain Location: Incision site Pain Descriptors / Indicators: Operative site guarding Pain Intervention(s): Monitored during session     Hand Dominance Right   Extremity/Trunk Assessment Upper Extremity Assessment Upper Extremity Assessment: Overall WFL for tasks assessed  Lower Extremity Assessment Lower Extremity Assessment: Defer to PT evaluation   Cervical / Trunk Assessment Cervical / Trunk Assessment: Neck Surgery   Communication Communication Communication: No difficulties   Cognition Arousal/Alertness: Awake/alert Behavior During Therapy: WFL for tasks assessed/performed Overall Cognitive Status: Within Functional Limits for tasks assessed                                       General Comments  VSS on RA, educated on donning and doffing her brace as well as wearing schedule.    Exercises      Shoulder Instructions      Home Living Family/patient expects to be discharged to:: Private residence Living Arrangements: Spouse/significant other Available Help at Discharge: Family;Available 24 hours/day Type of Home: House Home Access: Stairs to enter [employer] of Steps: 2 Entrance Stairs-Rails: None Home Layout: One level     Bathroom Shower/Tub: Tub/shower unit;Walk-in shower   Bathroom Toilet: Standard     Home Equipment: Hand held shower head;Shower seat          Prior Functioning/Environment Prior Level of Function : Independent/Modified Independent                        OT Problem List: Decreased strength;Decreased range of motion;Decreased activity tolerance      OT Treatment/Interventions:      OT Goals(Current goals can be found in the care plan section) Acute Rehab OT Goals Patient Stated Goal: To go home OT Goal Formulation: All assessment and education complete, DC therapy Time For Goal Achievement: 03/01/21 Potential to Achieve Goals: Good  OT Frequency:      Co-evaluation              AM-PAC OT "6 Clicks" Daily Activity     Outcome Measure Help from another person eating meals?: None Help from another person taking care of personal grooming?: None Help from another person toileting, which includes using toliet, bedpan, or urinal?: None Help from another person bathing (including washing, rinsing, drying)?: None Help from another person to put on and taking off regular upper body clothing?: None Help from another person to put on and taking off regular lower body clothing?: None 6 Click Score: 24   End of Session Equipment Utilized During Treatment: Cervical collar Nurse Communication: Mobility status  Activity Tolerance: Patient tolerated treatment well Patient left: in bed;with call bell/phone within reach  OT Visit Diagnosis: Unsteadiness on feet (R26.81);Other abnormalities of gait and mobility  (R26.89);Muscle weakness (generalized) (M62.81)                Time: 7680-8811 OT Time Calculation (min): 43 min Charges:  OT General Charges $OT Visit: 1 Visit OT Evaluation $OT Eval Moderate Complexity: 1 Mod OT Treatments $Self Care/Home Management : 23-37 mins  Dalaysia Harms H., OTR/L Acute Rehabilitation  Junko Ohagan Elane Mei Suits 03/01/2021, 11:12 AM

## 2021-03-01 NOTE — Discharge Summary (Signed)
Physician Discharge Summary  Patient ID: Grace Thomas MRN: [id number] DOB/AGE: 10-07-1950 71 y.o.  Admit date: 02/28/2021 Discharge date: 03/01/2021  Admission Diagnoses: Cervical spondylosis with radiculopathy, C4-5, C5-6, right greater than left  Discharge Diagnoses: Cervical spondylosis with radiculopathy, C4-5, C5-6, right greater than left  Principal Problem:   Cervical radiculopathy at C6   Discharged Condition: good  Hospital Course: The patient was admitted on 02/28/2021 and taken to the operating room where the patient underwent C4/5 and C5/6 ACDF. The patient tolerated the procedure well and was taken to the recovery room and then to the floor in stable condition. The hospital course was routine. There were no complications. The wound remained clean dry and intact. Pt had appropriate upper back/neck soreness. No complaints of arm/leg pain or new N/T/W. The patient remained afebrile with stable vital signs, and tolerated a regular diet. The patient continued to increase activities, and pain was well controlled with oral pain medications.   Consults: None  Significant Diagnostic Studies: radiology: X-Ray: intraoperative   Treatments: surgery:  1. Arthrodesis C4-5, C5-6, anterior interbody technique  2. Placement of intervertebral biomechanical device C4-5, C5-6, K2 M [place] titanium interbody, C4-5: 7 x 12 x 14 mm, C5-6: 6 x 12 x 14 mm titanium interbody's 3. Placement of anterior instrumentation consisting of interbody plate and screws -K2 M [place] 36 mm plate with 4.0 x 16 mm screws in C4, 4.0 x 14 mm screws at C5, 4.0 x 16 mm at C6 4. Discectomy at C4-5, C5-6 for decompression of spinal cord and exiting nerve roots  5. Use of morselized bone allograft  6. Use of intraoperative microscope  Discharge Exam: Blood pressure (!) 113/57, pulse 92, temperature 98.5 F (36.9 C), temperature source Oral, resp. rate 18, height 5\' 3"  (1.6 m), weight 77.1 kg, SpO2 96 %. Physical  Exam: Patient is awake, A/O X 4, conversant, and in good spirits. They are in NAD and VSS. Doing well. Speech is fluent and appropriate. MAEW with good strength that is symmetric bilaterally. 5/5 BUE/BLE. Sensation to light touch is intact. PERLA, EOMI. CNs grossly intact. Dressing is clean dry intact. Incision is well approximated with no drainage, erythema, or edema. Hard cervical collar in place.      Disposition: Discharge disposition: 01-Home or Self Care       Discharge Instructions     Incentive spirometry RT   Complete by: As directed       Allergies as of 03/01/2021   No Known Allergies      Medication List     STOP taking these medications    traMADol 50 MG tablet Commonly known as: ULTRAM       TAKE these medications    acetaminophen 500 MG tablet Commonly known as: TYLENOL Take 1,000 mg by mouth every 4 (four) hours as needed for moderate pain or mild pain.   ALPRAZolam 0.25 MG tablet Commonly known as: XANAX Take 0.125 mg by mouth daily as needed for anxiety (anxiety/nerves).   amoxicillin 500 MG tablet Commonly known as: AMOXIL Take 1 tablet (500 mg total) by mouth as directed. Take 4 tablets by mouth 1 hour prior to dental procedures and cleanings   aspirin 81 MG chewable tablet Chew 1 tablet (81 mg total) by mouth daily.   fexofenadine 180 MG tablet Commonly known as: ALLEGRA Take 180 mg by mouth daily as needed for allergies or rhinitis.   fluconazole 150 MG tablet Commonly known as: DIFLUCAN Take 150 mg by mouth every  other day.   HYDROcodone-acetaminophen 5-325 MG tablet Commonly known as: NORCO/VICODIN Take 1 tablet by mouth every 4 (four) hours as needed for moderate pain.   methocarbamol 500 MG tablet Commonly known as: Robaxin Take 1 tablet (500 mg total) by mouth 4 (four) times daily.   miconazole 2 % vaginal cream Commonly known as: MONISTAT 7 Place 1 Applicatorful vaginally at bedtime as needed (yeast infection).    pantoprazole 40 MG tablet Commonly known as: PROTONIX Take 1 tablet (40 mg total) by mouth daily. What changed:  when to take this reasons to take this   triamcinolone 55 MCG/ACT Aero nasal inhaler Commonly known as: NASACORT Place 2 sprays into the nose daily as needed (allergies.).   VITAMIN C PO Take 2 tablets by mouth every evening.   Vitamin D3 50 MCG (2000 UT) Tabs Take 2,000 Units by mouth every evening.   Yeast Balance ICS Caps Take 1 capsule by mouth every evening. AZO   Zinc 50 MG Tabs Take 50 mg by mouth every evening.         Signed: Marvis Moeller, DNP, NP-C 03/01/2021, 8:24 AM

## 2021-03-01 NOTE — Evaluation (Signed)
Physical Therapy Evaluation Patient Details Name: Grace Thomas MRN: [id number] DOB: 05-26-1950 Today's Date: 03/01/2021  History of Present Illness  Pt is a 71 y/o female who presents s/p C4-C6 ACDF on 02/28/2021. PMH significant for TAVR 2022, R rotator cuff repair.   Clinical Impression  Pt admitted with above diagnosis. At the time of PT eval, pt was able to demonstrate transfers and ambulation with up to min guard assist and no AD. Pt was educated on precautions, brace application/wearing schedule, appropriate activity progression, and car transfer. Pt currently with functional limitations due to the deficits listed below (see PT Problem List). Pt will benefit from skilled PT to increase their independence and safety with mobility to allow discharge to the venue listed below.         Recommendations for follow up therapy are one component of a multi-disciplinary discharge planning process, led by the attending physician.  Recommendations may be updated based on patient status, additional functional criteria and insurance authorization.  Follow Up Recommendations No PT follow up    Assistance Recommended at Discharge PRN  Patient can return home with the following  A little help with walking and/or transfers;Assist for transportation;Help with stairs or ramp for entrance    Equipment Recommendations None recommended by PT  Recommendations for Other Services       Functional Status Assessment Patient has had a recent decline in their functional status and demonstrates the ability to make significant improvements in function in a reasonable and predictable amount of time.     Precautions / Restrictions Precautions Precautions: Fall;Cervical Precaution Booklet Issued: Yes (comment) Precaution Comments: Reviewed handout and pt was cued for precautions during ADL's Required Braces or Orthoses: Cervical Brace Cervical Brace: Hard collar Restrictions Weight Bearing Restrictions: No       Mobility  Bed Mobility Overal bed mobility: Modified Independent             General bed mobility comments: HOB flat and rails lowered to simulate home environment. Pt was able to demonstrate good log roll technique several times for practice throughout session.    Transfers Overall transfer level: Modified independent Equipment used: None               General transfer comment: Pt was able to power up to full stand without assistance. Min use of UE's.    Ambulation/Gait Ambulation/Gait assistance: Min guard, Supervision Gait Distance (Feet): 400 Feet Assistive device: None Gait Pattern/deviations: Step-through pattern, Decreased stride length, Trunk flexed, Drifts right/left Gait velocity: Decreased Gait velocity interpretation: 1.31 - 2.62 ft/sec, indicative of limited community ambulator   General Gait Details: Occasional unsteadiness noted. Pt was able to recover without assistance. Overall good posture and maintenance of precautions throughout gait training.  Stairs Stairs: Yes Stairs assistance: Min guard Stair Management: One rail Right, Step to pattern, Forwards Number of Stairs: 4 General stair comments: VC's for general safety and sequencing. Recommended counting stairs at home to avoid missing a step and excessive cervical flexion  Wheelchair Mobility    Modified Rankin (Stroke Patients Only)       Balance Overall balance assessment: Mild deficits observed, not formally tested                                           Pertinent Vitals/Pain Pain Assessment Pain Assessment: Faces Faces Pain Scale: Hurts a little bit Pain Location: Incision  site Pain Descriptors / Indicators: Operative site guarding Pain Intervention(s): Limited activity within patient's tolerance, Monitored during session, Repositioned    Home Living Family/patient expects to be discharged to:: Private residence Living Arrangements: Spouse/significant  other Available Help at Discharge: Family;Available 24 hours/day Type of Home: House Home Access: Stairs to enter Entrance Stairs-Rails: None Entrance Stairs-Number of Steps: 2   Home Layout: One level Home Equipment: Hand held shower head;Shower seat      Prior Function Prior Level of Function : Independent/Modified Independent                     Hand Dominance   Dominant Hand: Right    Extremity/Trunk Assessment   Upper Extremity Assessment Upper Extremity Assessment: Overall WFL for tasks assessed    Lower Extremity Assessment Lower Extremity Assessment: Defer to PT evaluation    Cervical / Trunk Assessment Cervical / Trunk Assessment: Neck Surgery  Communication   Communication: No difficulties  Cognition Arousal/Alertness: Awake/alert Behavior During Therapy: WFL for tasks assessed/performed Overall Cognitive Status: Within Functional Limits for tasks assessed                                          General Comments General comments (skin integrity, edema, etc.): VSS on RA, educated on donning and doffing her brace as well as wearing schedule.    Exercises     Assessment/Plan    PT Assessment Patient needs continued PT services  PT Problem List Decreased strength;Decreased activity tolerance;Decreased balance;Decreased mobility;Decreased knowledge of use of DME;Decreased safety awareness;Decreased knowledge of precautions;Pain       PT Treatment Interventions DME instruction;Gait training;Functional mobility training;Therapeutic activities;Stair training;Therapeutic exercise;Balance training;Cognitive remediation;Patient/family education    PT Goals (Current goals can be found in the Care Plan section)  Acute Rehab PT Goals Patient Stated Goal: Home today, be able to take care of the house PT Goal Formulation: With patient Time For Goal Achievement: 03/08/21 Potential to Achieve Goals: Good    Frequency Min 5X/week      Co-evaluation               AM-PAC PT "6 Clicks" Mobility  Outcome Measure Help needed turning from your back to your side while in a flat bed without using bedrails?: None Help needed moving from lying on your back to sitting on the side of a flat bed without using bedrails?: None Help needed moving to and from a bed to a chair (including a wheelchair)?: None Help needed standing up from a chair using your arms (e.g., wheelchair or bedside chair)?: None Help needed to walk in hospital room?: A Little Help needed climbing 3-5 steps with a railing? : A Little 6 Click Score: 22    End of Session Equipment Utilized During Treatment: Gait belt;Cervical collar Activity Tolerance: Patient tolerated treatment well Patient left: in chair;with call bell/phone within reach Nurse Communication: Mobility status PT Visit Diagnosis: Unsteadiness on feet (R26.81);Pain Pain - part of body:  (neck)    Time: 5573-2202 PT Time Calculation (min) (ACUTE ONLY): 30 min   Charges:   PT Evaluation $PT Eval Low Complexity: 1 Low PT Treatments $Gait Training: 8-22 mins        Rolinda Roan, PT, DPT Acute Rehabilitation Services Pager: [phone] Office: [phone]   Thelma Comp 03/01/2021, 11:39 AM

## 2021-04-17 DIAGNOSIS — Z6830 Body mass index (BMI) 30.0-30.9, adult: Secondary | ICD-10-CM | POA: Diagnosis not present

## 2021-04-17 DIAGNOSIS — M5412 Radiculopathy, cervical region: Secondary | ICD-10-CM | POA: Diagnosis not present

## 2021-04-17 DIAGNOSIS — R030 Elevated blood-pressure reading, without diagnosis of hypertension: Secondary | ICD-10-CM | POA: Diagnosis not present

## 2021-04-18 DIAGNOSIS — S46011D Strain of muscle(s) and tendon(s) of the rotator cuff of right shoulder, subsequent encounter: Secondary | ICD-10-CM | POA: Diagnosis not present

## 2021-04-20 ENCOUNTER — Other Ambulatory Visit: Payer: Self-pay | Admitting: Orthopedic Surgery

## 2021-04-20 ENCOUNTER — Other Ambulatory Visit (HOSPITAL_COMMUNITY): Payer: Self-pay | Admitting: Orthopedic Surgery

## 2021-04-20 DIAGNOSIS — M75100 Unspecified rotator cuff tear or rupture of unspecified shoulder, not specified as traumatic: Secondary | ICD-10-CM

## 2021-04-26 ENCOUNTER — Ambulatory Visit (INDEPENDENT_AMBULATORY_CARE_PROVIDER_SITE_OTHER): Payer: Medicare Other | Admitting: Cardiology

## 2021-04-26 ENCOUNTER — Encounter: Payer: Self-pay | Admitting: Cardiology

## 2021-04-26 VITALS — BP 116/68 | HR 70 | Ht 63.0 in | Wt 174.6 lb

## 2021-04-26 DIAGNOSIS — I2510 Atherosclerotic heart disease of native coronary artery without angina pectoris: Secondary | ICD-10-CM | POA: Diagnosis not present

## 2021-04-26 DIAGNOSIS — Z952 Presence of prosthetic heart valve: Secondary | ICD-10-CM

## 2021-04-26 NOTE — Progress Notes (Signed)
? ? ?Cardiology Office Note ? ?Date: 04/26/2021  ? ?ID: Grace Thomas, DOB 04/25/1950, MRN [id number] ? ?PCP:  Neale Burly, MD  ?Cardiologist:  Rozann Lesches, MD ?Electrophysiologist:  None  ? ?Chief Complaint  ?Patient presents with  ? Cardiac follow-up  ? ? ?History of Present Illness: ?Grace Thomas is a 71 y.o. female last seen in August 2022.  She has had interval follow-up in the structural heart clinic, I reviewed the note from November 2022.  She is here for a routine visit, doing well from a cardiac perspective with NYHA class I dyspnea. ? ?She is status post TAVR in October 2022, follow-up echocardiogram is noted below.  She had normal coronary arteries at preprocedure cardiac catheterization in August 2022. ? ?I went over her medications which are noted below. ? ?She did undergo cervical spine surgery in February, now in the process of evaluation of right shoulder pain, not certain as yet whether she will need any further surgical intervention. ? ?Past Medical History:  ?Diagnosis Date  ? Anxiety   ? Arthritis   ? COVID-19   ? July 2022  ? GERD (gastroesophageal reflux disease)   ? Recurrent UTI   ? S/P TAVR (transcatheter aortic valve replacement) 11/01/2020  ? Edwards 14m S3U TF approach with Dr. CJeronimo Greaves BCyndia Bent ? ? ?Past Surgical History:  ?Procedure Laterality Date  ? ABDOMINAL HYSTERECTOMY  1994  ? TAH BSO  ? ANTERIOR CERVICAL DECOMP/DISCECTOMY FUSION N/A 02/28/2021  ? Procedure: Cervical Four-Five Cervical Five-Six Anterior Cervical Decompression Fusion;  Surgeon: Dawley, TTheodoro Doing DO;  [facility]  Service: Neurosurgery;  Laterality: N/A;  3C/RM 19  ? CARDIAC CATHETERIZATION    ? CARPAL TUNNEL RELEASE Bilateral   ? RIGHT/LEFT HEART CATH AND CORONARY ANGIOGRAPHY N/A 09/05/2020  ? Procedure: RIGHT/LEFT HEART CATH AND CORONARY ANGIOGRAPHY;  Surgeon: CSherren Mocha MD;  [facility];  Service: Cardiovascular;  Laterality: N/A;  ? ROTATOR CUFF REPAIR Right   ? TEE  WITHOUT CARDIOVERSION N/A 11/01/2020  ? Procedure: TRANSESOPHAGEAL ECHOCARDIOGRAM (TEE);  Surgeon: CSherren Mocha MD;  [facility]  Service: Open Heart Surgery;  Laterality: N/A;  ? TRANSCATHETER AORTIC VALVE REPLACEMENT, TRANSFEMORAL N/A 11/01/2020  ? Procedure: TRANSCATHETER AORTIC VALVE REPLACEMENT, TRANSFEMORAL;  Surgeon: CSherren Mocha MD;  [facility] [facility]  Service: Open Heart Surgery;  Laterality: N/A;  ? TUBAL LIGATION    ? ? ?Current Outpatient Medications  ?Medication Sig Dispense Refill  ? acetaminophen (TYLENOL) 500 MG tablet Take 1,000 mg by mouth every 4 (four) hours as needed for moderate pain or mild pain.    ? ALPRAZolam (XANAX) 0.25 MG tablet Take 0.125 mg by mouth daily as needed for anxiety (anxiety/nerves).    ? amoxicillin (AMOXIL) 500 MG tablet Take 1 tablet (500 mg total) by mouth as directed. Take 4 tablets by mouth 1 hour prior to dental procedures and cleanings 12 tablet 6  ? Ascorbic Acid (VITAMIN C PO) Take 2 tablets by mouth every evening.    ? aspirin 81 MG chewable tablet Chew 1 tablet (81 mg total) by mouth daily.    ? Cholecalciferol (VITAMIN D3) 50 MCG (2000 UT) TABS Take 2,000 Units by mouth every evening.    ? fexofenadine (ALLEGRA) 180 MG tablet Take 180 mg by mouth daily as needed for allergies or rhinitis.    ? fluconazole (DIFLUCAN) 150 MG tablet Take 150 mg by mouth every other day.    ? HYDROcodone-acetaminophen (NORCO/VICODIN) 5-325 MG tablet Take  1 tablet by mouth every 4 (four) hours as needed for moderate pain. 20 tablet 0  ? methocarbamol (ROBAXIN) 500 MG tablet Take 1 tablet (500 mg total) by mouth 4 (four) times daily. 90 tablet 2  ? miconazole (MONISTAT 7) 2 % vaginal cream Place 1 Applicatorful vaginally at bedtime as needed (yeast infection).    ? Misc Natural Products (YEAST BALANCE ICS) CAPS Take 1 capsule by mouth every evening. AZO    ? pantoprazole (PROTONIX) 40 MG tablet Take 1 tablet (40 mg total) by mouth daily. (Patient taking differently: Take  40 mg by mouth daily as needed (acid reflux).) 60 tablet 3  ? triamcinolone (NASACORT) 55 MCG/ACT AERO nasal inhaler Place 2 sprays into the nose daily as needed (allergies.).    ? Zinc 50 MG TABS Take 50 mg by mouth every evening.    ? ?Current Facility-Administered Medications  ?Medication Dose Route Frequency Provider Last Rate Last Admin  ? 0.9 %  sodium chloride infusion  500 mL Intravenous Once Doran Stabler, MD      ? ?Allergies:  Patient has no known allergies.  ? ?ROS: No palpitations or syncope. ? ?Physical Exam: ?VS:  BP 116/68   Pulse 70   Ht '5\' 3"'$  (1.6 m)   Wt 174 lb 9.6 oz (79.2 kg)   SpO2 97%   BMI 30.93 kg/m? , BMI Body mass index is 30.93 kg/m?. ? ?Wt Readings from Last 3 Encounters:  ?04/26/21 174 lb 9.6 oz (79.2 kg)  ?02/28/21 170 lb (77.1 kg)  ?02/23/21 169 lb 4.8 oz (76.8 kg)  ?  ?General: Patient appears comfortable at rest. ?HEENT: Conjunctiva and lids normal. ?Neck: Supple, no elevated JVP or carotid bruits, no thyromegaly. ?Lungs: Clear to auscultation, nonlabored breathing at rest. ?Cardiac: Regular rate and rhythm, no S3, 2/6 systolic murmur. ?Extremities: No pitting edema. ? ?ECG:  An ECG dated 11/09/2020 was personally reviewed today and demonstrated:  Sinus rhythm. ? ?Recent Labwork: ?10/28/2020: ALT 20; AST 18 ?02/23/2021: BUN 13; Creatinine, Ser 0.88; Hemoglobin 13.4; Platelets 257; Potassium 4.0; Sodium 138  ? ?Other Studies Reviewed Today: ? ?Echocardiogram 12/07/2020: ? 1. Left ventricular ejection fraction, by estimation, is 60 to 65%. The  ?left ventricle has normal function. The left ventricle has no regional  ?wall motion abnormalities. Left ventricular diastolic parameters are  ?indeterminate.  ? 2. Right ventricular systolic function is normal. The right ventricular  ?size is normal. There is normal pulmonary artery systolic pressure.  ? 3. The mitral valve is rheumatic. Trivial mitral valve regurgitation.  ?Mild mitral stenosis. MG 65mHg at 82 bpm, MVA 2.0 cm^2 by  PHT  ? 4. The inferior vena cava is normal in size with greater than 50%  ?respiratory variability, suggesting right atrial pressure of 3 mmHg.  ? 5. There is a 23 mm Edwards Sapien prosthetic, stented (TAVR) valve  ?present in the aortic position  ?    Aortic valve regurgitation is trivial. Echo findings are consistent  ?with normal structure and function of the aortic valve prosthesis. Vmax  ?2.3 m/s, MG 12 mmHg, DI 0.41  ? ?Assessment and Plan: ? ?1.  History of severe aortic stenosis status post TAVR in October 2022.  Follow-up echocardiogram in November 2022 revealed LVEF 60 to 65%, normally functioning TAVR prosthesis with mean gradient 12 mmHg and trivial aortic regurgitation.  She continues on aspirin.  Follow-up later this year in the structural heart clinic with repeat echocardiogram. ? ?2.  Normal coronary arteries at pre-TAVR  cardiac evaluation. ? ? ?Medication Adjustments/Labs and Tests Ordered: ?Current medicines are reviewed at length with the patient today.  Concerns regarding medicines are outlined above.  ? ?Tests Ordered: ?No orders of the defined types were placed in this encounter. ? ? ?Medication Changes: ?No orders of the defined types were placed in this encounter. ? ? ?Disposition:  Follow up  1 year. ? ?Signed, ?Satira Sark, MD, [facility] ?04/26/2021 2:35 PM    ?[facility] ?[place], [place], [place] [address] ?Phone: [phone]; Fax: [phone]  ?

## 2021-04-26 NOTE — Patient Instructions (Signed)

## 2021-04-27 ENCOUNTER — Ambulatory Visit (HOSPITAL_COMMUNITY)
Admission: RE | Admit: 2021-04-27 | Discharge: 2021-04-27 | Disposition: A | Discharge status: Home / Self Care | Payer: Medicare Other | Source: Ambulatory Visit | Attending: Orthopedic Surgery | Admitting: Orthopedic Surgery

## 2021-04-27 DIAGNOSIS — Z9889 Other specified postprocedural states: Secondary | ICD-10-CM | POA: Diagnosis not present

## 2021-04-27 DIAGNOSIS — M19011 Primary osteoarthritis, right shoulder: Secondary | ICD-10-CM | POA: Diagnosis not present

## 2021-04-27 DIAGNOSIS — M6250 Muscle wasting and atrophy, not elsewhere classified, unspecified site: Secondary | ICD-10-CM | POA: Insufficient documentation

## 2021-04-27 DIAGNOSIS — M75101 Unspecified rotator cuff tear or rupture of right shoulder, not specified as traumatic: Secondary | ICD-10-CM | POA: Insufficient documentation

## 2021-04-27 DIAGNOSIS — M75100 Unspecified rotator cuff tear or rupture of unspecified shoulder, not specified as traumatic: Secondary | ICD-10-CM

## 2021-04-27 DIAGNOSIS — M75121 Complete rotator cuff tear or rupture of right shoulder, not specified as traumatic: Secondary | ICD-10-CM | POA: Diagnosis not present

## 2021-04-27 MED ORDER — SODIUM CHLORIDE (PF) 0.9 % IJ SOLN
5.0000 mL | Freq: Once | INTRAMUSCULAR | Status: AC
  Administered 2021-04-27: 5 mL

## 2021-04-27 MED ORDER — IOHEXOL 180 MG/ML  SOLN
15.0000 mL | Freq: Once | INTRAMUSCULAR | Status: AC | PRN
  Administered 2021-04-27: 15 mL via INTRA_ARTICULAR

## 2021-04-27 MED ORDER — LIDOCAINE HCL (PF) 1 % IJ SOLN
5.0000 mL | Freq: Once | INTRAMUSCULAR | Status: AC
  Administered 2021-04-27: 5 mL via INTRADERMAL

## 2021-05-02 DIAGNOSIS — S83241A Other tear of medial meniscus, current injury, right knee, initial encounter: Secondary | ICD-10-CM | POA: Diagnosis not present

## 2021-05-02 DIAGNOSIS — M19011 Primary osteoarthritis, right shoulder: Secondary | ICD-10-CM | POA: Diagnosis not present

## 2021-05-02 DIAGNOSIS — S46011D Strain of muscle(s) and tendon(s) of the rotator cuff of right shoulder, subsequent encounter: Secondary | ICD-10-CM | POA: Diagnosis not present

## 2021-05-09 DIAGNOSIS — M19011 Primary osteoarthritis, right shoulder: Secondary | ICD-10-CM | POA: Diagnosis not present

## 2021-05-10 ENCOUNTER — Telehealth: Payer: Self-pay | Admitting: *Deleted

## 2021-05-10 NOTE — Telephone Encounter (Signed)
? ?  Pre-operative Risk Assessment  ?  ?Patient Name: Grace Thomas  ?DOB: Jul 11, 1950 ?MRN: [id number]  ? ?  ? ?Request for Surgical Clearance   ? ?Procedure:   RIGHT TOTAL SHOULDER REPLACEMENT ? ?Date of Surgery:  Clearance TBD                              ?   ?Surgeon:  Ophelia Charter, MD ?Surgeon's Group or Practice Name:  [facility] ?Phone number:  [phone] ?Fax number:  [phone] ATTN:  Derek Jack ?  ?Type of Clearance Requested:   ?- Medical  ?- Pharmacy:  Hold Aspirin NOT INDICATED ON CLEARANCE BUT ON PT'S MED LIST ?  ?Type of Anesthesia:  General  ?  ?Additional requests/questions:   ? ?Signed, ?Jeanann Lewandowsky   ?05/10/2021, 2:23 PM  ? ?

## 2021-05-10 NOTE — Telephone Encounter (Signed)
? ?  Patient Name: Grace Thomas  ?DOB: [birth year] ?MRN: [id number] ? ?Primary Cardiologist: Rozann Lesches, MD ? ?Chart reviewed as part of pre-operative protocol coverage. Patient's last OV was recent on 04/26/21 at which time she was felt to be doing well without any new concerns. She had tolerated cervical spine surgery earlier this year without difficulty. She underwent  TAVR in October 2022.  Follow-up echocardiogram in November 2022 revealed LVEF 60 to 65%, normally functioning TAVR prosthesis with mean gradient 12 mmHg and trivial aortic regurgitation.  She continues on aspirin. She had normal coronaries before TAVR. RCRI is 0.4% indicating low CV risk. I reached out to patient to review formal preop clearance questions since not specifically addressed at recent [facility]. Got VM. LMTCB. ? ?Charlie Pitter, PA-C ?05/10/2021, 2:34 PM ? ? ?

## 2021-05-11 NOTE — Telephone Encounter (Signed)
Tried to return patient's call but got voice mail on both home and mobile numbers. Left message to call back. ? ?Darreld Mclean, PA-C ?05/11/2021 4:43 PM ? ? ?

## 2021-05-11 NOTE — Telephone Encounter (Signed)
Patient is returning call.  °

## 2021-05-16 DIAGNOSIS — F064 Anxiety disorder due to known physiological condition: Secondary | ICD-10-CM | POA: Diagnosis not present

## 2021-05-16 DIAGNOSIS — M25511 Pain in right shoulder: Secondary | ICD-10-CM | POA: Diagnosis not present

## 2021-05-22 DIAGNOSIS — M47816 Spondylosis without myelopathy or radiculopathy, lumbar region: Secondary | ICD-10-CM | POA: Diagnosis not present

## 2021-05-22 NOTE — Telephone Encounter (Signed)
Will forward to pre op pool as it looks like pre op provider was trying to reach the pt.  ?

## 2021-05-22 NOTE — Telephone Encounter (Signed)
Patient is returning PA's call. ?

## 2021-05-22 NOTE — Telephone Encounter (Signed)
? ?  Name: Grace Thomas  ?DOB: [birth year]  ?MRN: [id number]  ? ?Primary Cardiologist: Rozann Lesches, MD ? ?Chart reviewed as part of pre-operative protocol coverage. Patient was contacted 05/22/2021 in reference to pre-operative risk assessment for pending surgery as outlined below.  ALETHEA TERHAAR was last seen on 04/26/2021 by Dr. Domenic Polite.  Since that day, ANACLARA ACKLIN has done well from a cardiac standpoint.  Overall, she reports feeling well and denies any new or concerning symptoms. According to the Revised Cardiac Risk Index (RCRI), her risk of major cardiac event is 0.4%.  Her METs are 6.79 according to the Duke Activity Status Index (DASI). ? ?Therefore, based on ACC/AHA guidelines, the patient would be at acceptable risk for the planned procedure without further cardiovascular testing.  ? ?The patient was advised that if she develops new symptoms prior to surgery to contact our office to arrange for a follow-up visit, and she verbalized understanding.  ? ?Patient takes aspirin 81 mg daily.  Ideally, patient will continue aspirin 81 mg daily through the perioperative period.  However, if aspirin needs to be held prior to surgery, please contact our office. ? ?I will route this recommendation to the requesting party via Epic fax function and remove from pre-op pool. Please call with questions. ? ?Lenna Sciara, NP ?05/22/2021, 2:25 PM ? ?

## 2021-05-31 ENCOUNTER — Telehealth: Payer: Self-pay | Admitting: Cardiology

## 2021-05-31 NOTE — Telephone Encounter (Signed)
   Pre-operative Risk Assessment    Patient Name: Grace Thomas  DOB: 1950/11/27 MRN: [id number]     Request for Surgical Clearance    Procedure:  Dental Cleaning   Date of Surgery:  Clearance 06/01/21                                 Surgeon:  Dr. Jamelle Rushing Group or Practice Name:  [facility] Phone number:  [phone] Fax number:  [phone]   Type of Clearance Requested:   - Medical    Type of Anesthesia:  None    Additional requests/questions:  patient is scheduled for just routine cleaning and x-rays but still needs dental clearance   Signed, Johnna Acosta   05/31/2021, 9:14 AM

## 2021-05-31 NOTE — Telephone Encounter (Signed)
   Primary Cardiologist: Rozann Lesches, MD  Chart reviewed as part of pre-operative protocol coverage. Simple dental extractions are considered low risk procedures per guidelines and generally do not require any specific cardiac clearance. It is also generally accepted that for simple extractions and dental cleanings, there is no need to interrupt blood thinner therapy.   SBE prophylaxis is required for the patient.  Patient has prescription for amoxicillin to be taken 1 hour prior to dental procedure/cleanings.  I will route this recommendation to the requesting party via Epic fax function and remove from pre-op pool.  Please call with questions.  Deberah Pelton, NP 05/31/2021, 9:52 AM

## 2021-06-14 ENCOUNTER — Encounter (HOSPITAL_BASED_OUTPATIENT_CLINIC_OR_DEPARTMENT_OTHER): Payer: Self-pay | Admitting: Orthopaedic Surgery

## 2021-06-14 ENCOUNTER — Other Ambulatory Visit: Payer: Self-pay

## 2021-06-19 ENCOUNTER — Encounter (HOSPITAL_BASED_OUTPATIENT_CLINIC_OR_DEPARTMENT_OTHER)
Admission: RE | Admit: 2021-06-19 | Discharge: 2021-06-19 | Disposition: A | Discharge status: Home / Self Care | Payer: Medicare Other | Source: Ambulatory Visit | Attending: Orthopaedic Surgery | Admitting: Orthopaedic Surgery

## 2021-06-19 DIAGNOSIS — Z01812 Encounter for preprocedural laboratory examination: Secondary | ICD-10-CM | POA: Diagnosis not present

## 2021-06-19 LAB — SURGICAL PCR SCREEN
MRSA, PCR: NEGATIVE
Staphylococcus aureus: NEGATIVE

## 2021-06-19 NOTE — Progress Notes (Signed)
Surgical soap given with instructions, pt verbalized understanding. Enhanced Recovery after Surgery  Enhanced Recovery after Surgery is a protocol used to improve the stress on your body and your recovery after surgery.  Patient Instructions  The night before surgery:  No food after midnight. ONLY clear liquids after midnight  The day of surgery (if you do NOT have diabetes):  Drink ONE (1) Pre-Surgery Clear Ensure as directed.   This drink was given to you during your hospital  pre-op appointment visit. The pre-op nurse will instruct you on the time to drink the  Pre-Surgery Ensure depending on your surgery time. Finish the drink at the designated time by the pre-op nurse.  Nothing else to drink after completing the  Pre-Surgery Clear Ensure.  The day of surgery (if you have diabetes): Drink ONE (1) Gatorade 2 (G2) as directed. This drink was given to you during your hospital  pre-op appointment visit.  The pre-op nurse will instruct you on the time to drink the   Gatorade 2 (G2) depending on your surgery time. Color of the Gatorade may vary. Red is not allowed. Nothing else to drink after completing the  Gatorade 2 (G2).         If office.you have questions, please contact your surgeon's office Enhanced Recovery after Surgery  Enhanced Recovery after Surgery is a protocol used to improve the stress on your body and your recovery after surgery.  Patient Instructions  The night before surgery:  No food after midnight. ONLY clear liquids after midnight  The day of surgery (if you do NOT have diabetes):  Drink ONE (1) Pre-Surgery Clear Ensure as directed.   This drink was given to you during your hospital  pre-op appointment visit. The pre-op nurse will instruct you on the time to drink the  Pre-Surgery Ensure depending on your surgery time. Finish the drink at the designated time by the pre-op nurse.  Nothing else to drink after completing the  Pre-Surgery Clear  Ensure.  The day of surgery (if you have diabetes): Drink ONE (1) Gatorade 2 (G2) as directed. This drink was given to you during your hospital  pre-op appointment visit.  The pre-op nurse will instruct you on the time to drink the   Gatorade 2 (G2) depending on your surgery time. Color of the Gatorade may vary. Red is not allowed. Nothing else to drink after completing the  Gatorade 2 (G2).         If office.you have questions, please contact your surgeon's office Benzoyl peroxide gel given with written instructions, pt verbalized understanding.

## 2021-06-21 NOTE — H&P (Signed)
PREOPERATIVE H&P  Chief Complaint: djd right shoulder  HPI: Grace Thomas is a 71 y.o. female who is scheduled for Procedure(s): REVERSE SHOULDER ARTHROPLASTY.   Patient has a past medical history significant for aortic stenosis with aortic valve replacement.   Grace Thomas is a 71 year-old seen for evaluation for persistent right shoulder pain.  She underwent a cervical fusion by Dr. Merla Riches on February 28, 2021, but despite this she continues to have significant scapular radiculopathy.  She is also having right shoulder pain.  She had undergone an MRI of her right shoulder on July 30, 2020 that showed significant motion degradation with post surgical changes from her prior rotator cuff repair with at least a partial, if not complete, rotator cuff tear and a CT arthrogram was recommended, but this has not been done.  Of note, she had rotator cuff repair by Dr. Gladstone Lighter in 2004.    Symptoms are rated as moderate to severe, and have been worsening.  This is significantly impairing activities of daily living.    Please see clinic note for further details on this patient's care.    She has elected for surgical management.   Past Medical History:  Diagnosis Date   Anxiety    Aortic stenosis 11/01/2020   TAVR   Arthritis    Asthma due to seasonal allergies    COVID-26 July 2020   Recurrent UTI    S/P TAVR (transcatheter aortic valve replacement) 11/01/2020   Edwards 66m S3U TF approach with Dr. CLupe Carney  Past Surgical History:  Procedure Laterality Date   ABDOMINAL HYSTERECTOMY  1994   TAH BSO   ANTERIOR CERVICAL DECOMP/DISCECTOMY FUSION N/A 02/28/2021   Procedure: Cervical Four-Five Cervical Five-Six Anterior Cervical Decompression Fusion;  Surgeon: DKarsten Ro DO;  [facility]  Service: Neurosurgery;  Laterality: N/A;  3C/RM 19   CARDIAC CATHETERIZATION     CARPAL TUNNEL RELEASE Bilateral    RIGHT/LEFT HEART CATH AND CORONARY ANGIOGRAPHY N/A 09/05/2020    Procedure: RIGHT/LEFT HEART CATH AND CORONARY ANGIOGRAPHY;  Surgeon: CSherren Mocha MD;  [facility];  Service: Cardiovascular;  Laterality: N/A;   ROTATOR CUFF REPAIR Right    TEE WITHOUT CARDIOVERSION N/A 11/01/2020   Procedure: TRANSESOPHAGEAL ECHOCARDIOGRAM (TEE);  Surgeon: CSherren Mocha MD;  [facility]  Service: Open Heart Surgery;  Laterality: N/A;   TRANSCATHETER AORTIC VALVE REPLACEMENT, TRANSFEMORAL N/A 11/01/2020   Procedure: TRANSCATHETER AORTIC VALVE REPLACEMENT, TRANSFEMORAL;  Surgeon: CSherren Mocha MD;  [facility]  Service: Open Heart Surgery;  Laterality: N/A;   TUBAL LIGATION     Social History   Socioeconomic History   Marital status: Married    Spouse name: Not on file   Number of children: Not on file   Years of education: Not on file   Highest education level: Not on file  Occupational History   Not on file  Tobacco Use   Smoking status: Never   Smokeless tobacco: Never  Vaping Use   Vaping Use: Never used  Substance and Sexual Activity   Alcohol use: No   Drug use: No   Sexual activity: Yes    Birth control/protection: Surgical    Comment: Hyst  Other Topics Concern   Not on file  Social History Narrative   Not on file   Social Determinants of Health   Financial Resource Strain: Not on file  Food Insecurity: Not on file  Transportation Needs: Not on  file  Physical Activity: Not on file  Stress: Not on file  Social Connections: Not on file   Family History  Problem Relation Age of Onset   Heart failure Mother    Stroke Father    Bone cancer Sister    Pancreatic cancer Sister    Colitis Daughter    No Known Allergies Prior to Admission medications   Medication Sig Start Date End Date Taking? Authorizing Provider  acetaminophen (TYLENOL) 500 MG tablet Take 1,000 mg by mouth every 4 (four) hours as needed for moderate pain or mild pain.   Yes [provider]  ALPRAZolam (XANAX) 0.25 MG tablet Take  0.125 mg by mouth daily as needed for anxiety (anxiety/nerves).   Yes [provider]  amoxicillin (AMOXIL) 500 MG tablet Take 1 tablet (500 mg total) by mouth as directed. Take 4 tablets by mouth 1 hour prior to dental procedures and cleanings 11/09/20  Yes Eileen Stanford, PA-C  Ascorbic Acid (VITAMIN C PO) Take 2 tablets by mouth every evening.   Yes [provider]  aspirin 81 MG chewable tablet Chew 1 tablet (81 mg total) by mouth daily. 11/03/20  Yes Kathyrn Drown D, NP  celecoxib (CELEBREX) 100 MG capsule Take 100 mg by mouth 2 (two) times daily. Uses prn only   Yes [provider]  Cholecalciferol (VITAMIN D3) 50 MCG (2000 UT) TABS Take 2,000 Units by mouth every evening.   Yes [provider]  fexofenadine (ALLEGRA) 180 MG tablet Take 180 mg by mouth daily as needed for allergies or rhinitis.   Yes [provider]  fluconazole (DIFLUCAN) 150 MG tablet Take 150 mg by mouth every other day. 02/13/21  Yes [provider]  methocarbamol (ROBAXIN) 500 MG tablet Take 1 tablet (500 mg total) by mouth 4 (four) times daily. 03/01/21  Yes Marvis Moeller, NP  miconazole (MONISTAT 7) 2 % vaginal cream Place 1 Applicatorful vaginally at bedtime as needed (yeast infection).   Yes [provider]  Misc Natural Products (YEAST BALANCE ICS) CAPS Take 1 capsule by mouth every evening. AZO   Yes [provider]  triamcinolone (NASACORT) 55 MCG/ACT AERO nasal inhaler Place 2 sprays into the nose daily as needed (allergies.).   Yes [provider]  Zinc 50 MG TABS Take 50 mg by mouth every evening.   Yes [provider]    ROS: All other systems have been reviewed and were otherwise negative with the exception of those mentioned in the HPI and as above.  Physical Exam: General: Alert, no acute distress Cardiovascular: No pedal edema Respiratory: No cyanosis, no use of accessory musculature GI: No organomegaly,  abdomen is soft and non-tender Skin: No lesions in the area of chief complaint Neurologic: Sensation intact distally Psychiatric: Patient is competent for consent with normal mood and affect Lymphatic: No axillary or cervical lymphadenopathy  MUSCULOSKELETAL:  Range of motion of the shoulder is to 80 degrees; passive to 100.  She had significant scapular substitution. External rotation to 30; internal rotation to belt.  Cuff strength is 4/5 throughout.   Imaging: CT arthrogram demonstrates a recurrent tear of the superior cuff, likely chronic.   BMI: Estimated body mass index is 31 kg/m as calculated from the following:   Height as of this encounter: '5\' 3"'$  (1.6 m).   Weight as of this encounter: 79.4 kg.  Lab Results  Component Value Date   ALBUMIN 3.9 10/28/2020   Diabetes: Patient does not have a  diagnosis of diabetes.     Smoking Status:   reports that she has never smoked. She has never used smokeless tobacco.     Assessment: djd right shoulder  Plan: Plan for Procedure(s): REVERSE SHOULDER ARTHROPLASTY  The risks benefits and alternatives were discussed with the patient including but not limited to the risks of nonoperative treatment, versus surgical intervention including infection, bleeding, nerve injury,  blood clots, cardiopulmonary complications, morbidity, mortality, among others, and they were willing to proceed.   We additionally specifically discussed risks of axillary nerve injury, infection, periprosthetic fracture, continued pain and longevity of implants prior to beginning procedure.    Patient will be closely monitored in PACU for medical stabilization and pain control. If found stable in PACU, patient may be discharged home with outpatient follow-up. If any concerns regarding patient's stabilization patient will be admitted for observation after surgery. The patient is planning to be discharged home with outpatient PT.   The patient acknowledged the  explanation, agreed to proceed with the plan and consent was signed.   Operative Plan: Right reverse total shoulder arthroplasty Discharge Medications: Standard DVT Prophylaxis: Aspirin Physical Therapy: Outpatient PT - [facility] Special Discharge needs: Sling (received from office). [place], PA-C  06/21/2021 11:25 AM

## 2021-06-21 NOTE — Discharge Instructions (Addendum)
Ophelia Charter MD, MPH Noemi Chapel, PA-C [facility] [address] [phone] (tel)   [phone] (fax)   [place] may leave the operative dressing in place until your follow-up appointment. KEEP THE INCISIONS CLEAN AND DRY. There may be a small amount of fluid/bleeding leaking at the surgical site. This is normal after surgery.  If it fills with liquid or blood please call us immediately to change it for you. Use the provided ice machine or Ice packs as often as possible for the first 3-4 days, then as needed for pain relief.   Keep a layer of cloth or a shirt between your skin and the cooling unit to prevent frost bite as it can get very cold.  SHOWERING: - You may shower on Post-Op Day #2.  - The dressing is water resistant but do not scrub it as it may start to peel up.   - You may remove the sling for showering - Gently pat the area dry.  - Do not soak the shoulder in water.  - Do not go swimming in the pool or ocean until your incision has completely healed (about 4-6 weeks after surgery) - KEEP THE INCISIONS CLEAN AND DRY.  EXERCISES Wear the sling at all times  You may remove the sling for showering, but keep the arm across the chest or in a secondary sling.    Accidental/Purposeful External Rotation and shoulder flexion (reaching behind you) is to be avoided at all costs for the first month. It is ok to come out of your sling if your are sitting and have assistance for eating.   Do not lift anything heavier than 1 pound until we discuss it further in clinic.  It is normal for your fingers/hand to become more swollen after surgery and discolored from bruising.   This will resolve over the first few weeks usually after surgery. Please continue to ambulate and do not stay sitting or lying for too long.  Perform foot and wrist pumps to assist in circulation.  PHYSICAL  THERAPY - You will begin physical therapy soon after surgery  - Please call to set up an appointment, if you do not already have one  - A PT referral was sent to [facility], but you should still contact their office to set up an appointment if you do not already have one - Let our office if there are any issues with scheduling your therapy  - A hard copy of your physical therapy prescription and physical therapy protocol was provided to you today  REGIONAL ANESTHESIA (NERVE BLOCKS) The anesthesia team may have performed a nerve block for you this is a great tool used to minimize pain.   The block may start wearing off overnight (between 8-24 hours postop) When the block wears off, your pain may go from nearly zero to the pain you would have had postop without the block. This is an abrupt transition but nothing dangerous is happening.   This can be a challenging period but utilize your as needed pain medications to try and manage this period. We suggest you use the pain medication the first night prior to going to bed, to ease this transition.  You may take an extra dose of narcotic when this happens if needed   POST-OP MEDICATIONS- Multimodal approach to pain control In general your pain will be controlled with a combination of substances.  Prescriptions unless otherwise discussed are electronically sent to your pharmacy.  This is a carefully made plan we use to minimize narcotic use.     Acetaminophen - Non-narcotic pain medicine taken on a scheduled basis  Oxycodone - This is a strong narcotic, to be used only on an "as needed" basis for SEVERE pain. Aspirin '81mg'$  - This medicine is used to minimize the risk of blood clots after surgery. Omeprazole - daily medicine to protect your stomach while taking anti-inflammatories.  This may only be used in higher risk patients. Zofran -  take as needed for nausea   FOLLOW-UP If you develop a Fever (>101.5), Redness or Drainage  from the surgical incision site, please call our office to arrange for an evaluation. Please call the office to schedule a follow-up appointment for a wound check, 7-10 days post-operatively.  IF YOU HAVE ANY QUESTIONS, PLEASE FEEL FREE TO CALL OUR OFFICE.  HELPFUL INFORMATION  Your arm will be in a sling following surgery. You will be in this sling for the next 4 weeks.   You may be more comfortable sleeping in a semi-seated position the first few nights following surgery.  Keep a pillow propped under the elbow and forearm for comfort.  If you have a recliner type of chair it might be beneficial.  If not that is fine too, but it would be helpful to sleep propped up with pillows behind your operated shoulder as well under your elbow and forearm.  This will reduce pulling on the suture lines.  When dressing, put your operative arm in the sleeve first.  When getting undressed, take your operative arm out last.  Loose fitting, button-down shirts are recommended.  In most states it is against the law to drive while your arm is in a sling. And certainly against the law to drive while taking narcotics.  You may return to work/school in the next couple of days when you feel up to it. Desk work and typing in the sling is fine.  We suggest you use the pain medication the first night prior to going to bed, in order to ease any pain when the anesthesia wears off. You should avoid taking pain medications on an empty stomach as it will make you nauseous.  You should wean off your narcotic medicines as soon as you are able.     Most patients will be off or using minimal narcotics before their first postop appointment.   Do not drink alcoholic beverages or take illicit drugs when taking pain medications.  Pain medication may make you constipated.  Below are a few solutions to try in this order: Decrease the amount of pain medication if you aren't having pain. Drink lots of decaffeinated fluids. Drink  prune juice and/or each dried prunes  If the first 3 don't work start with additional solutions Take Colace - an over-the-counter stool softener Take Senokot - an over-the-counter laxative Take Miralax - a stronger over-the-counter laxative   Dental Antibiotics:  In most cases prophylactic antibiotics for Dental procdeures after total joint surgery are not necessary.  Exceptions are as follows:  1. History of prior total joint infection  2. Severely immunocompromised (Organ Transplant, cancer chemotherapy, Rheumatoid biologic meds such as Eagle)  3. Poorly controlled diabetes (A1C &gt; 8.0, blood glucose over 200)  If you have one of these conditions, contact your surgeon for an antibiotic prescription, prior to your dental procedure.   For more information including helpful videos and documents visit our  website:   [email or web address]    No Tylenol before 1:45pm    Post Anesthesia Home Care Instructions  Activity: Get plenty of rest for the remainder of the day. A responsible individual must stay with you for 24 hours following the procedure.  For the next 24 hours, DO NOT: -Drive a car -Paediatric nurse -Drink alcoholic beverages -Take any medication unless instructed by your physician -Make any legal decisions or sign important papers.  Meals: Start with liquid foods such as gelatin or soup. Progress to regular foods as tolerated. Avoid greasy, spicy, heavy foods. If nausea and/or vomiting occur, drink only clear liquids until the nausea and/or vomiting subsides. Call your physician if vomiting continues.  Special Instructions/Symptoms: Your throat may feel dry or sore from the anesthesia or the breathing tube placed in your throat during surgery. If this causes discomfort, gargle with warm salt water. The discomfort should disappear within 24 hours.  If you had a scopolamine patch placed behind your ear for the management of  post- operative nausea and/or vomiting:  1. The medication in the patch is effective for 72 hours, after which it should be removed.  Wrap patch in a tissue and discard in the trash. Wash hands thoroughly with soap and water. 2. You may remove the patch earlier than 72 hours if you experience unpleasant side effects which may include dry mouth, dizziness or visual disturbances. 3. Avoid touching the patch. Wash your hands with soap and water after contact with the patch.    Regional Anesthesia Blocks  1. Numbness or the inability to move the "blocked" extremity may last from 3-48 hours after placement. The length of time depends on the medication injected and your individual response to the medication. If the numbness is not going away after 48 hours, call your surgeon.  2. The extremity that is blocked will need to be protected until the numbness is gone and the  Strength has returned. Because you cannot feel it, you will need to take extra care to avoid injury. Because it may be weak, you may have difficulty moving it or using it. You may not know what position it is in without looking at it while the block is in effect.  3. For blocks in the legs and feet, returning to weight bearing and walking needs to be done carefully. You will need to wait until the numbness is entirely gone and the strength has returned. You should be able to move your leg and foot normally before you try and bear weight or walk. You will need someone to be with you when you first try to ensure you do not fall and possibly risk injury.  4. Bruising and tenderness at the needle site are common side effects and will resolve in a few days.  5. Persistent numbness or new problems with movement should be communicated to the surgeon or the [facility] [phone] [place] [phone]). [facility] ([place]):  Please contact your surgeon if you have questions or concerns about your  sling.

## 2021-06-22 ENCOUNTER — Ambulatory Visit (HOSPITAL_COMMUNITY): Payer: Medicare Other

## 2021-06-22 ENCOUNTER — Ambulatory Visit (HOSPITAL_BASED_OUTPATIENT_CLINIC_OR_DEPARTMENT_OTHER): Payer: Medicare Other | Admitting: Certified Registered"

## 2021-06-22 ENCOUNTER — Other Ambulatory Visit: Payer: Self-pay

## 2021-06-22 ENCOUNTER — Ambulatory Visit (HOSPITAL_BASED_OUTPATIENT_CLINIC_OR_DEPARTMENT_OTHER)
Admission: RE | Admit: 2021-06-22 | Discharge: 2021-06-22 | Disposition: A | Discharge status: Home / Self Care | Payer: Medicare Other | Attending: Orthopaedic Surgery | Admitting: Orthopaedic Surgery

## 2021-06-22 ENCOUNTER — Encounter (HOSPITAL_BASED_OUTPATIENT_CLINIC_OR_DEPARTMENT_OTHER): Payer: Self-pay | Admitting: Orthopaedic Surgery

## 2021-06-22 ENCOUNTER — Encounter (HOSPITAL_BASED_OUTPATIENT_CLINIC_OR_DEPARTMENT_OTHER)
Admission: RE | Disposition: A | Discharge status: Home / Self Care | Payer: Self-pay | Source: Home / Self Care | Attending: Orthopaedic Surgery

## 2021-06-22 DIAGNOSIS — E669 Obesity, unspecified: Secondary | ICD-10-CM | POA: Insufficient documentation

## 2021-06-22 DIAGNOSIS — M25711 Osteophyte, right shoulder: Secondary | ICD-10-CM | POA: Diagnosis not present

## 2021-06-22 DIAGNOSIS — M75121 Complete rotator cuff tear or rupture of right shoulder, not specified as traumatic: Secondary | ICD-10-CM | POA: Insufficient documentation

## 2021-06-22 DIAGNOSIS — I350 Nonrheumatic aortic (valve) stenosis: Secondary | ICD-10-CM | POA: Insufficient documentation

## 2021-06-22 DIAGNOSIS — F419 Anxiety disorder, unspecified: Secondary | ICD-10-CM

## 2021-06-22 DIAGNOSIS — M4802 Spinal stenosis, cervical region: Secondary | ICD-10-CM

## 2021-06-22 DIAGNOSIS — M19011 Primary osteoarthritis, right shoulder: Secondary | ICD-10-CM | POA: Insufficient documentation

## 2021-06-22 DIAGNOSIS — Z952 Presence of prosthetic heart valve: Secondary | ICD-10-CM | POA: Diagnosis not present

## 2021-06-22 DIAGNOSIS — Z981 Arthrodesis status: Secondary | ICD-10-CM | POA: Insufficient documentation

## 2021-06-22 DIAGNOSIS — G8918 Other acute postprocedural pain: Secondary | ICD-10-CM | POA: Diagnosis not present

## 2021-06-22 DIAGNOSIS — Z01818 Encounter for other preprocedural examination: Secondary | ICD-10-CM

## 2021-06-22 DIAGNOSIS — G709 Myoneural disorder, unspecified: Secondary | ICD-10-CM | POA: Diagnosis not present

## 2021-06-22 DIAGNOSIS — Z6830 Body mass index (BMI) 30.0-30.9, adult: Secondary | ICD-10-CM | POA: Insufficient documentation

## 2021-06-22 DIAGNOSIS — J45909 Unspecified asthma, uncomplicated: Secondary | ICD-10-CM | POA: Diagnosis not present

## 2021-06-22 HISTORY — DX: Unspecified asthma, uncomplicated: J45.909

## 2021-06-22 HISTORY — PX: REVERSE SHOULDER ARTHROPLASTY: SHX5054

## 2021-06-22 SURGERY — ARTHROPLASTY, SHOULDER, TOTAL, REVERSE
Anesthesia: General | Site: Shoulder | Laterality: Right

## 2021-06-22 MED ORDER — FENTANYL CITRATE (PF) 100 MCG/2ML IJ SOLN
INTRAMUSCULAR | Status: DC | PRN
Start: 2021-06-22 — End: 2021-06-22
  Administered 2021-06-22: 100 ug via INTRAVENOUS

## 2021-06-22 MED ORDER — TRANEXAMIC ACID-NACL 1000-0.7 MG/100ML-% IV SOLN
1000.0000 mg | INTRAVENOUS | Status: AC
  Administered 2021-06-22: 1000 mg via INTRAVENOUS

## 2021-06-22 MED ORDER — LACTATED RINGERS IV SOLN: INTRAVENOUS | Status: DC

## 2021-06-22 MED ORDER — TRANEXAMIC ACID-NACL 1000-0.7 MG/100ML-% IV SOLN
INTRAVENOUS | Status: AC
  Filled 2021-06-22: qty 100

## 2021-06-22 MED ORDER — ROCURONIUM BROMIDE 10 MG/ML (PF) SYRINGE
PREFILLED_SYRINGE | INTRAVENOUS | Status: AC
  Filled 2021-06-22: qty 10

## 2021-06-22 MED ORDER — DEXAMETHASONE SODIUM PHOSPHATE 10 MG/ML IJ SOLN
INTRAMUSCULAR | Status: DC | PRN
  Administered 2021-06-22: 8 mg via INTRAVENOUS

## 2021-06-22 MED ORDER — BUPIVACAINE HCL (PF) 0.5 % IJ SOLN
INTRAMUSCULAR | Status: DC | PRN
  Administered 2021-06-22: 15 mL via PERINEURAL

## 2021-06-22 MED ORDER — LACTATED RINGERS IV BOLUS: 250.0000 mL | Freq: Once | INTRAVENOUS | Status: DC

## 2021-06-22 MED ORDER — EPHEDRINE SULFATE (PRESSORS) 50 MG/ML IJ SOLN
INTRAMUSCULAR | Status: DC | PRN
  Administered 2021-06-22: 15 mg via INTRAVENOUS
  Administered 2021-06-22: 10 mg via INTRAVENOUS

## 2021-06-22 MED ORDER — LIDOCAINE 2% (20 MG/ML) 5 ML SYRINGE
INTRAMUSCULAR | Status: AC
  Filled 2021-06-22: qty 5

## 2021-06-22 MED ORDER — MIDAZOLAM HCL 2 MG/2ML IJ SOLN
INTRAMUSCULAR | Status: AC
  Filled 2021-06-22: qty 2

## 2021-06-22 MED ORDER — PHENYLEPHRINE HCL-NACL 20-0.9 MG/250ML-% IV SOLN
INTRAVENOUS | Status: DC | PRN
  Administered 2021-06-22: 25 ug/min via INTRAVENOUS

## 2021-06-22 MED ORDER — GABAPENTIN 300 MG PO CAPS
300.0000 mg | ORAL_CAPSULE | Freq: Once | ORAL | Status: AC
  Administered 2021-06-22: 300 mg via ORAL

## 2021-06-22 MED ORDER — 0.9 % SODIUM CHLORIDE (POUR BTL) OPTIME
TOPICAL | Status: DC | PRN
  Administered 2021-06-22: 1000 mL

## 2021-06-22 MED ORDER — PROPOFOL 10 MG/ML IV BOLUS
INTRAVENOUS | Status: AC
  Filled 2021-06-22: qty 20

## 2021-06-22 MED ORDER — GABAPENTIN 300 MG PO CAPS
ORAL_CAPSULE | ORAL | Status: AC
  Filled 2021-06-22: qty 1

## 2021-06-22 MED ORDER — PROPOFOL 10 MG/ML IV BOLUS
INTRAVENOUS | Status: DC | PRN
  Administered 2021-06-22: 100 mg via INTRAVENOUS

## 2021-06-22 MED ORDER — ROCURONIUM BROMIDE 100 MG/10ML IV SOLN
INTRAVENOUS | Status: DC | PRN
  Administered 2021-06-22: 70 mg via INTRAVENOUS

## 2021-06-22 MED ORDER — ONDANSETRON HCL 4 MG PO TABS: 4.0000 mg | ORAL_TABLET | Freq: Three times a day (TID) | ORAL | 0 refills | Status: AC | PRN

## 2021-06-22 MED ORDER — DEXAMETHASONE SODIUM PHOSPHATE 10 MG/ML IJ SOLN
INTRAMUSCULAR | Status: AC
  Filled 2021-06-22: qty 1

## 2021-06-22 MED ORDER — OXYCODONE HCL 5 MG PO TABS: ORAL_TABLET | ORAL | 0 refills | Status: AC

## 2021-06-22 MED ORDER — CEFAZOLIN SODIUM-DEXTROSE 2-4 GM/100ML-% IV SOLN
INTRAVENOUS | Status: AC
  Filled 2021-06-22: qty 100

## 2021-06-22 MED ORDER — VANCOMYCIN HCL 1000 MG IV SOLR
INTRAVENOUS | Status: DC | PRN
  Administered 2021-06-22: 1000 mg

## 2021-06-22 MED ORDER — MIDAZOLAM HCL 2 MG/2ML IJ SOLN
1.0000 mg | Freq: Once | INTRAMUSCULAR | Status: AC
  Administered 2021-06-22: 1 mg via INTRAVENOUS

## 2021-06-22 MED ORDER — LACTATED RINGERS IV BOLUS: 500.0000 mL | Freq: Once | INTRAVENOUS | Status: DC

## 2021-06-22 MED ORDER — ACETAMINOPHEN 500 MG PO TABS
1000.0000 mg | ORAL_TABLET | Freq: Once | ORAL | Status: AC
  Administered 2021-06-22: 1000 mg via ORAL

## 2021-06-22 MED ORDER — ONDANSETRON HCL 4 MG/2ML IJ SOLN
INTRAMUSCULAR | Status: AC
  Filled 2021-06-22: qty 2

## 2021-06-22 MED ORDER — ONDANSETRON HCL 4 MG/2ML IJ SOLN
INTRAMUSCULAR | Status: DC | PRN
  Administered 2021-06-22: 4 mg via INTRAVENOUS

## 2021-06-22 MED ORDER — ACETAMINOPHEN 500 MG PO TABS: 1000.0000 mg | ORAL_TABLET | Freq: Three times a day (TID) | ORAL | 0 refills | Status: AC

## 2021-06-22 MED ORDER — OMEPRAZOLE 20 MG PO CPDR: 20.0000 mg | DELAYED_RELEASE_CAPSULE | Freq: Every day | ORAL | 0 refills | Status: AC

## 2021-06-22 MED ORDER — FENTANYL CITRATE (PF) 100 MCG/2ML IJ SOLN
INTRAMUSCULAR | Status: AC
  Filled 2021-06-22: qty 2

## 2021-06-22 MED ORDER — CEFAZOLIN SODIUM-DEXTROSE 2-4 GM/100ML-% IV SOLN
2.0000 g | INTRAVENOUS | Status: AC
  Administered 2021-06-22: 2 g via INTRAVENOUS

## 2021-06-22 MED ORDER — BUPIVACAINE LIPOSOME 1.3 % IJ SUSP
INTRAMUSCULAR | Status: DC | PRN
  Administered 2021-06-22: 10 mL via PERINEURAL

## 2021-06-22 MED ORDER — SUGAMMADEX SODIUM 200 MG/2ML IV SOLN
INTRAVENOUS | Status: DC | PRN
  Administered 2021-06-22: 330 mg via INTRAVENOUS

## 2021-06-22 MED ORDER — PHENYLEPHRINE HCL (PRESSORS) 10 MG/ML IV SOLN
INTRAVENOUS | Status: AC
  Filled 2021-06-22: qty 1

## 2021-06-22 MED ORDER — FENTANYL CITRATE (PF) 100 MCG/2ML IJ SOLN
50.0000 ug | Freq: Once | INTRAMUSCULAR | Status: AC
  Administered 2021-06-22: 50 ug via INTRAVENOUS

## 2021-06-22 MED ORDER — ASPIRIN 81 MG PO CHEW: 81.0000 mg | CHEWABLE_TABLET | Freq: Two times a day (BID) | ORAL | 0 refills | Status: AC

## 2021-06-22 MED ORDER — ACETAMINOPHEN 500 MG PO TABS
ORAL_TABLET | ORAL | Status: AC
  Filled 2021-06-22: qty 2

## 2021-06-22 MED ORDER — LIDOCAINE 2% (20 MG/ML) 5 ML SYRINGE
INTRAMUSCULAR | Status: DC | PRN
  Administered 2021-06-22: 60 mg via INTRAVENOUS

## 2021-06-22 SURGICAL SUPPLY — 71 items
AID PSTN UNV HD RSTRNT DISP (MISCELLANEOUS) ×1
APL PRP STRL LF DISP 70% ISPRP (MISCELLANEOUS) ×1
BASEPLATE GLENOID RSA 3X25 0D (Shoulder) ×1 IMPLANT
BIT DRILL 3.2 PERIPHERAL SCREW (BIT) ×1 IMPLANT
BLADE HEX COATED 2.75 (ELECTRODE) IMPLANT
BLADE SAW SGTL 73X25 THK (BLADE) ×2 IMPLANT
BLADE SURG 10 STRL SS (BLADE) IMPLANT
BLADE SURG 15 STRL LF DISP TIS (BLADE) IMPLANT
BLADE SURG 15 STRL SS (BLADE)
BNDG COHESIVE 4X5 TAN ST LF (GAUZE/BANDAGES/DRESSINGS) IMPLANT
BRUSH SCRUB EZ PLAIN DRY (MISCELLANEOUS) ×2 IMPLANT
BSPLAT GLND +3 25 (Shoulder) ×1 IMPLANT
CHLORAPREP W/TINT 26 (MISCELLANEOUS) ×2 IMPLANT
CLSR STERI-STRIP ANTIMIC 1/2X4 (GAUZE/BANDAGES/DRESSINGS) ×2 IMPLANT
COOLER ICEMAN CLASSIC (MISCELLANEOUS) ×2 IMPLANT
COVER BACK TABLE 60X90IN (DRAPES) ×2 IMPLANT
COVER MAYO STAND STRL (DRAPES) ×2 IMPLANT
DRAPE IMP U-DRAPE 54X76 (DRAPES) ×2 IMPLANT
DRAPE INCISE IOBAN 66X45 STRL (DRAPES) ×2 IMPLANT
DRAPE U-SHAPE 76X120 STRL (DRAPES) ×4 IMPLANT
DRSG AQUACEL AG ADV 3.5X 6 (GAUZE/BANDAGES/DRESSINGS) ×2 IMPLANT
ELECT BLADE 4.0 EZ CLEAN MEGAD (MISCELLANEOUS) ×2
ELECT REM PT RETURN 9FT ADLT (ELECTROSURGICAL) ×2
ELECTRODE BLDE 4.0 EZ CLN MEGD (MISCELLANEOUS) ×1 IMPLANT
ELECTRODE REM PT RTRN 9FT ADLT (ELECTROSURGICAL) ×1 IMPLANT
FACESHIELD WRAPAROUND (MASK) ×4 IMPLANT
FACESHIELD WRAPAROUND OR TEAM (MASK) ×2 IMPLANT
GLENOIDSPHERE LATERALIZED 33 (Joint) ×2 IMPLANT
GLOVE BIO SURGEON STRL SZ 6.5 (GLOVE) ×4 IMPLANT
GLOVE BIOGEL PI IND STRL 6.5 (GLOVE) ×1 IMPLANT
GLOVE BIOGEL PI IND STRL 8 (GLOVE) ×1 IMPLANT
GLOVE BIOGEL PI INDICATOR 6.5 (GLOVE) ×1
GLOVE BIOGEL PI INDICATOR 8 (GLOVE) ×1
GLOVE ECLIPSE 8.0 STRL XLNG CF (GLOVE) ×4 IMPLANT
GOWN STRL REUS W/ TWL LRG LVL3 (GOWN DISPOSABLE) ×2 IMPLANT
GOWN STRL REUS W/TWL LRG LVL3 (GOWN DISPOSABLE) ×4
GOWN STRL REUS W/TWL XL LVL3 (GOWN DISPOSABLE) ×2 IMPLANT
GUIDE PIN 3X75 SHOULDER (PIN) ×2
GUIDEWIRE GLENOID 2.5X220 (WIRE) ×1 IMPLANT
HANDPIECE INTERPULSE COAX TIP (DISPOSABLE) ×2
INSERT HUMERAL SZ1/2 33 (Orthopedic Implant) ×1 IMPLANT
KIT SHOULDER STAB MARCO (KITS) ×2 IMPLANT
MANIFOLD NEPTUNE II (INSTRUMENTS) ×2 IMPLANT
PACK BASIN DAY SURGERY FS (CUSTOM PROCEDURE TRAY) ×2 IMPLANT
PACK SHOULDER (CUSTOM PROCEDURE TRAY) ×2 IMPLANT
PAD COLD SHLDR WRAP-ON (PAD) ×2 IMPLANT
PAD ORTHO SHOULDER 7X19 LRG (SOFTGOODS) ×2 IMPLANT
PENCIL SMOKE EVACUATOR (MISCELLANEOUS) IMPLANT
PIN GUIDE 3X75 SHOULDER (PIN) IMPLANT
RESTRAINT HEAD UNIVERSAL NS (MISCELLANEOUS) ×2 IMPLANT
SCREW 5.5X22 (Screw) ×1 IMPLANT
SCREW BONE 6.5X40 SM (Screw) ×1 IMPLANT
SCREW PERIPHERAL 30 (Screw) ×1 IMPLANT
SET HNDPC FAN SPRY TIP SCT (DISPOSABLE) ×1 IMPLANT
SHEET MEDIUM DRAPE 40X70 STRL (DRAPES) ×2 IMPLANT
SLEEVE SCD COMPRESS KNEE MED (STOCKING) ×2 IMPLANT
SPHERE GLENOID LATERALIZED 33 (Joint) IMPLANT
SPIKE FLUID TRANSFER (MISCELLANEOUS) IMPLANT
SPONGE T-LAP 18X18 ~~LOC~~+RFID (SPONGE) ×2 IMPLANT
STEM HUM PLUS SHORT SZ1+ (Stem) ×1 IMPLANT
SUT ETHIBOND 2 V 37 (SUTURE) ×2 IMPLANT
SUT ETHIBOND NAB CT1 #1 30IN (SUTURE) ×2 IMPLANT
SUT FIBERWIRE #5 38 CONV NDL (SUTURE) ×8
SUT MNCRL AB 4-0 PS2 18 (SUTURE) ×2 IMPLANT
SUT VIC AB 0 CT1 27 (SUTURE)
SUT VIC AB 0 CT1 27XBRD ANBCTR (SUTURE) IMPLANT
SUT VIC AB 3-0 SH 27 (SUTURE) ×2
SUT VIC AB 3-0 SH 27X BRD (SUTURE) ×1 IMPLANT
SUTURE FIBERWR #5 38 CONV NDL (SUTURE) ×4 IMPLANT
TOWEL GREEN STERILE FF (TOWEL DISPOSABLE) ×6 IMPLANT
TUBE SUCTION HIGH CAP CLEAR NV (SUCTIONS) ×2 IMPLANT

## 2021-06-22 NOTE — Anesthesia Procedure Notes (Signed)
Procedure Name: Intubation Date/Time: 06/22/2021 10:31 AM  Performed by: Lavonia Dana, CRNAPre-anesthesia Checklist: Patient identified, Emergency Drugs available, Suction available and Patient being monitored Patient Re-evaluated:Patient Re-evaluated prior to induction Oxygen Delivery Method: Circle system utilized Preoxygenation: Pre-oxygenation with 100% oxygen Induction Type: IV induction Ventilation: Mask ventilation without difficulty Laryngoscope Size: Mac and 3 Grade View: Grade I Tube type: Oral Tube size: 7.0 mm Number of attempts: 1 Airway Equipment and Method: Stylet and Bite block Placement Confirmation: ETT inserted through vocal cords under direct vision, positive ETCO2 and breath sounds checked- equal and bilateral Secured at: 21 cm Tube secured with: Tape Dental Injury: Teeth and Oropharynx as per pre-operative assessment

## 2021-06-22 NOTE — Progress Notes (Signed)
Assisted Dr. Foster with right, interscalene , ultrasound guided block. Side rails up, monitors on throughout procedure. See vital signs in flow sheet. Tolerated Procedure well. 

## 2021-06-22 NOTE — Op Note (Signed)
Orthopaedic Surgery Operative Note (CSN: [id number])  Grace Thomas  10-01-1950 Date of Surgery: 06/22/2021   Diagnoses:  Right shoulder failed cuff repair  Procedure: Right reverse lateralized total Shoulder Arthroplasty   Operative Finding Successful completion of planned procedure.  Patient's bone quality was relatively soft.  That said the axillary nerve tug test was normal.  Subscapularis was essentially completely ruptured as was supra and infraspinatus.  Good stability of implants.  Post-operative plan: The patient will be NWB in sling.  The patient will be will be discharged from PACU if continues to be stable as was plan prior to surgery.  DVT prophylaxis Aspirin 81 mg twice daily for 6 weeks.  Pain control with PRN pain medication preferring oral medicines.  Follow up plan will be scheduled in approximately 7 days for incision check and XR.  Physical therapy to start immediately.  Implants: Tornier size 1+ perform humeral stem, +3 polyethylene, 33+3 glenosphere, 25+3 baseplate with a 40 by 6.5 center screw and 2 peripheral locking screws.  Post-Op Diagnosis: Same Surgeons:Primary: Hiram Gash, MD Assistants:Caroline McBane PA-C Location: [facility] OR ROOM 6 Anesthesia: General with Exparel Interscalene Antibiotics: Ancef 2g preop, Vancomycin '1000mg'$  locally Tourniquet time: None Estimated Blood Loss: 240 Complications: None Specimens: None Implants: Implant Name Type Inv. Item Serial No. Manufacturer Lot No. LRB No. Used Action  BASEPLATE GLENOID RSA 9B35 0D - [id number] Shoulder BASEPLATE GLENOID RSA 3G99 0D [id number] [employer]  Right 1 Implanted  GLENOIDSPHERE LATERALIZED 33 - [id number] Joint GLENOIDSPHERE LATERALIZED 33 [id number] [employer]  Right 1 Implanted  SCREW BONE 6.5X40 SM - [id number] Screw SCREW BONE 6.5X40 SM  [employer] STERILIZED IN TRAY Right 1 Implanted  SCREW PERIPHERAL 30 - [id number] Screw SCREW PERIPHERAL 30  [employer] STERILIZED IN TRAY Right 1  Implanted  SCREW 5.5X22 - [id number] Screw SCREW 5.5X22  [employer] STERILIZED IN TRAY Right 1 Implanted  STEM HUM PLUS SHORT SZ1+ - [id number] Stem STEM HUM PLUS SHORT SZ1+ [id number] [employer]  Right 1 Implanted  INSERT HUMERAL SZ1/2 33 - [id number] Orthopedic Implant INSERT HUMERAL SZ1/2 33 [id number] [employer]  Right 1 Implanted    Indications for Surgery:   Grace Thomas is a 71 y.o. female with irreparable cuff tear.  Benefits and risks of operative and nonoperative management were discussed prior to surgery with patient/guardian(s) and informed consent form was completed.  Infection and need for further surgery were discussed as was prosthetic stability and cuff issues.  We additionally specifically discussed risks of axillary nerve injury, infection, periprosthetic fracture, continued pain and longevity of implants prior to beginning procedure.      Procedure:   The patient was identified in the preoperative holding area where the surgical site was marked. Block placed by anesthesia with exparel.  The patient was taken to the OR where a procedural timeout was called and the above noted anesthesia was induced.  The patient was positioned beachchair on allen table with spider arm positioner.  Preoperative antibiotics were dosed.  The patient's right shoulder was prepped and draped in the usual sterile fashion.  A second preoperative timeout was called.       Standard deltopectoral approach was performed with a #10 blade. We dissected down to the subcutaneous tissues and the cephalic vein was taken laterally with the deltoid. Clavipectoral fascia was incised in line with the incision. Deep retractors were placed. The long of the biceps tendon was identified and there was significant tenosynovitis present.  Tenodesis was performed  to the pectoralis tendon with #2 Ethibond. The remaining biceps was followed up into the rotator interval where it was released.   The subscapularis was taken  down in a full thickness layer with capsule along the humeral neck extending inferiorly around the humeral head. We continued releasing the capsule directly off of the osteophytes inferiorly all the way around the corner. This allowed [place] to dislocate the humeral head.   The rotator cuff was carefully examined and noted to be irreperably torn.  The decision was confirmed that a reverse total shoulder was indicated for this patient.  There were osteophytes along the inferior humeral neck. The osteophytes were removed with an osteotome and a rongeur.  Osteophytes were removed with a rongeur and an osteotome and the anatomic neck was well visualized.     A humeral cutting guide was used extra medullary with a pin to help control version. The version was set at 20 of retroversion. Humeral osteotomy was performed with an oscillating saw. The head fragment was passed off the back table.  A cut protector plate was placed.  The subscapularis was again identified and immediately we took care to palpate the axillary nerve anteriorly and verify its position with gentle palpation as well as the tug test.  We then released the SGHL with bovie cautery prior to placing a curved mayo at the junction of the anterior glenoid well above the axillary nerve and bluntly dissecting the subscapularis from the capsule.  We then carefully protected the axillary nerve as we gently released the inferior capsule to fully mobilize the subscapularis.  An anterior deltoid retractor was then placed as well as a small Hohmann retractor superiorly.  The glenoid was relatively intact in the setting of an irreparable cuff tear  The remaining labrum was removed circumferentially taking great care not to disrupt the posterior capsule.   The glenoid drill guide was placed and used to drill a guide pin in the center, inferior position. The glenoid face was then reamed concentrically over the guide wire. The center hole was drilled over the  guidepin in a near anatomic angle of version. Next the glenoid vault was drilled back to a depth of 40 mm.  We tapped and then placed a 56m size baseplate with additional 366mlateralization was selected with a 6.5 mm x 40 mm length central screw.  The base plate was screwed into the glenoid vault obtaining secure fixation. We next placed superior and inferior locking screws for additional fixation.  Next a 33 +3 mm glenosphere was selected and impacted onto the baseplate. The center screw was tightened.  We turned attention back to the humeral side. The cut protector was removed.  We used the perform humeral sizing block to select the appropriate size which for this patient was a 1.  We then placed our center pin and reamed over it concentrically obtaining appropriate inset.  We then used our lateralizing chisel to prepare the lateral aspect of the humerus.  At that point we selected the appropriate implant trialing a 1+.  Using this trial implant we trialed multiple polyethylene sizes settling on a 3 which provided good stability and range of motion without excess soft tissue tension. The offset was dialed in to match the normal anatomy. The shoulder was trialed.  There was good ROM in all planes and the shoulder was stable with no inferior translation.  The real humeral implants were opened after again confirming sizes.  The trial was removed. #5 Fiberwire x4  sutures passed through the humeral neck for subscap repair. The humeral component was press-fit obtaining a secure fit. The joint was reduced and thoroughly irrigated with pulsatile lavage. Subscap was repaired back with #5 Fiberwire sutures through bone tunnels.  It was relatively thin in quality.  Hemostasis was obtained. The deltopectoral interval was reapproximated with #1 Ethibond. The subcutaneous tissues were closed with 2-0 Vicryl and the skin was closed with running monocryl.    The wounds were cleaned and dried and an Aquacel dressing was  placed. The drapes taken down. The arm was placed into sling with abduction pillow. Patient was awakened, extubated, and transferred to the recovery room in stable condition. There were no intraoperative complications. The sponge, needle, and attention counts were  correct at the end of the case.     Noemi Chapel, PA-C, present and scrubbed throughout the case, critical for completion in a timely fashion, and for retraction, instrumentation, closure.

## 2021-06-22 NOTE — Anesthesia Postprocedure Evaluation (Signed)
Anesthesia Post Note  Patient: Grace Thomas  Procedure(s) Performed: REVERSE SHOULDER ARTHROPLASTY (Right: Shoulder)     Patient location during evaluation: PACU Anesthesia Type: General Level of consciousness: awake and alert and oriented Pain management: pain level controlled Vital Signs Assessment: post-procedure vital signs reviewed and stable Respiratory status: spontaneous breathing, nonlabored ventilation and respiratory function stable Cardiovascular status: blood pressure returned to baseline and stable Postop Assessment: no apparent nausea or vomiting Anesthetic complications: no   No notable events documented.  Last Vitals:  Vitals:   06/22/21 1200 06/22/21 1215  BP: 128/64 126/67  Pulse: 74 75  Resp: 16 13  Temp:    SpO2: 98% 98%    Last Pain:  Vitals:   06/22/21 1215  TempSrc:   PainSc: 0-No pain                 Maelle Sheaffer A.

## 2021-06-22 NOTE — Anesthesia Procedure Notes (Signed)
Anesthesia Regional Block: Interscalene brachial plexus block   Pre-Anesthetic Checklist: , timeout performed,  Correct Patient, Correct Site, Correct Laterality,  Correct Procedure, Correct Position, site marked,  Risks and benefits discussed,  Surgical consent,  Pre-op evaluation,  At surgeon's request and post-op pain management  Laterality: Right  Prep: chloraprep       Needles:  Injection technique: Single-shot  Needle Type: Echogenic Stimulator Needle     Needle Length: 10cm  Needle Gauge: 21   Needle insertion depth: 5 cm   Additional Needles:     Motor weakness within 5 minutes.  Narrative:  Start time: 06/22/2021 9:55 AM End time: 06/22/2021 10:00 AM Injection made incrementally with aspirations every 5 mL.  Performed by: Personally  Anesthesiologist: Josephine Igo, MD  Additional Notes: Timeout performed. Patient sedated. Relevant anatomy ID'd using [place]. Incremental 2-57m injection of LA with frequent aspiration. Patient tolerated procedure well.     Right Interscalene Block

## 2021-06-22 NOTE — Anesthesia Preprocedure Evaluation (Signed)
Anesthesia Evaluation  Patient identified by MRN, date of birth, ID band Patient awake    Reviewed: Allergy & Precautions, NPO status , Patient's Chart, lab work & pertinent test results, reviewed documented beta blocker date and time   Airway Mallampati: II  TM Distance: >3 FB Neck ROM: Full    Dental no notable dental hx. (+) Teeth Intact, Partial Lower, Caps, Dental Advisory Given   Pulmonary asthma ,    Pulmonary exam normal breath sounds clear to auscultation       Cardiovascular negative cardio ROS Normal cardiovascular exam+ Valvular Problems/Murmurs AS  Rhythm:Regular Rate:Normal  S/P TAVR 11/01/20  EKG 11/09/20 NSR,   Echo 11/390/22 1. Left ventricular ejection fraction, by estimation, is 60 to 65%. The left ventricle has normal function. The left ventricle has no regional wall motion abnormalities. Left ventricular diastolic parameters are indeterminate.  2. Right ventricular systolic function is normal. The right ventricular size is normal. There is normal pulmonary artery systolic pressure.  3. The mitral valve is rheumatic. Trivial mitral valve regurgitation. Mild mitral stenosis. MG 69mHg at 82 bpm, MVA 2.0 cm^2 by PHT  4. The inferior vena cava is normal in size with greater than 50% respiratory variability, suggesting right atrial pressure of 3 mmHg.  5. There is a 23 mm Edwards Sapien prosthetic, stented (TAVR) valve present in the aortic position  Aortic valve regurgitation is trivial. Echo findings are consistent with normal structure and function of the aortic valve prosthesis. Vmax 2.3 m/s, MG 12 mmHg, DI 0.41    Neuro/Psych Anxiety  Neuromuscular disease    GI/Hepatic Neg liver ROS, GERD  Medicated,  Endo/Other  Obesity  Renal/GU negative Renal ROS  negative genitourinary   Musculoskeletal  (+) Arthritis , Osteoarthritis,  Cervical spinal stenosis S/P surgery for radiculopathy   Abdominal (+) +  obese,   Peds  Hematology negative hematology ROS (+)   Anesthesia Other Findings   Reproductive/Obstetrics                             Anesthesia Physical Anesthesia Plan  ASA: 3  Anesthesia Plan: General   Post-op Pain Management: Regional block*, Minimal or no pain anticipated and Precedex   Induction:   PONV Risk Score and Plan: 4 or greater and Treatment may vary due to age or medical condition, Ondansetron and Dexamethasone  Airway Management Planned: Oral ETT  Additional Equipment: None  Intra-op Plan:   Post-operative Plan: Extubation in OR  Informed Consent:   Plan Discussed with:   Anesthesia Plan Comments:         Anesthesia Quick Evaluation

## 2021-06-22 NOTE — Transfer of Care (Signed)
Immediate Anesthesia Transfer of Care Note  Patient: Grace Thomas  Procedure(s) Performed: REVERSE SHOULDER ARTHROPLASTY (Right: Shoulder)  Patient Location: PACU  Anesthesia Type:GA combined with regional for post-op pain  Level of Consciousness: drowsy  Airway & Oxygen Therapy: Patient Spontanous Breathing and Patient connected to face mask oxygen  Post-op Assessment: Report given to RN and Post -op Vital signs reviewed and stable  Post vital signs: Reviewed and stable  Last Vitals:  Vitals Value Taken Time  BP 127/79 06/22/21 1150  Temp    Pulse 69 06/22/21 1152  Resp 12 06/22/21 1152  SpO2 98 % 06/22/21 1152  Vitals shown include unvalidated device data.  Last Pain:  Vitals:   06/22/21 0737  TempSrc: Oral  PainSc: 0-No pain         Complications: No notable events documented.

## 2021-06-22 NOTE — Interval H&P Note (Signed)
History and Physical Interval Note:  06/22/2021 8:17 AM  Grace Thomas  has presented today for surgery, with the diagnosis of djd right shoulder.  The various methods of treatment have been discussed with the patient and family. After consideration of risks, benefits and other options for treatment, the patient has consented to  Procedure(s): REVERSE SHOULDER ARTHROPLASTY (Right) as a surgical intervention.  The patient's history has been reviewed, patient examined, no change in status, stable for surgery.  I have reviewed the patient's chart and labs.  Questions were answered to the patient's satisfaction.     Hiram Gash

## 2021-06-23 ENCOUNTER — Encounter (HOSPITAL_BASED_OUTPATIENT_CLINIC_OR_DEPARTMENT_OTHER): Payer: Self-pay | Admitting: Orthopaedic Surgery

## 2021-06-26 DIAGNOSIS — M25511 Pain in right shoulder: Secondary | ICD-10-CM | POA: Diagnosis not present

## 2021-06-29 DIAGNOSIS — M19011 Primary osteoarthritis, right shoulder: Secondary | ICD-10-CM | POA: Diagnosis not present

## 2021-07-06 DIAGNOSIS — M25511 Pain in right shoulder: Secondary | ICD-10-CM | POA: Diagnosis not present

## 2021-07-07 DIAGNOSIS — M25511 Pain in right shoulder: Secondary | ICD-10-CM | POA: Diagnosis not present

## 2021-07-10 DIAGNOSIS — M25511 Pain in right shoulder: Secondary | ICD-10-CM | POA: Diagnosis not present

## 2021-07-13 DIAGNOSIS — M25511 Pain in right shoulder: Secondary | ICD-10-CM | POA: Diagnosis not present

## 2021-07-17 DIAGNOSIS — M25511 Pain in right shoulder: Secondary | ICD-10-CM | POA: Diagnosis not present

## 2021-07-20 DIAGNOSIS — M19011 Primary osteoarthritis, right shoulder: Secondary | ICD-10-CM | POA: Diagnosis not present

## 2021-07-20 DIAGNOSIS — M25511 Pain in right shoulder: Secondary | ICD-10-CM | POA: Diagnosis not present

## 2021-07-24 DIAGNOSIS — M25511 Pain in right shoulder: Secondary | ICD-10-CM | POA: Diagnosis not present

## 2021-08-02 DIAGNOSIS — M25511 Pain in right shoulder: Secondary | ICD-10-CM | POA: Diagnosis not present

## 2021-08-04 DIAGNOSIS — M25511 Pain in right shoulder: Secondary | ICD-10-CM | POA: Diagnosis not present

## 2021-08-08 DIAGNOSIS — M25511 Pain in right shoulder: Secondary | ICD-10-CM | POA: Diagnosis not present

## 2021-08-10 DIAGNOSIS — M25511 Pain in right shoulder: Secondary | ICD-10-CM | POA: Diagnosis not present

## 2021-08-15 DIAGNOSIS — S83241D Other tear of medial meniscus, current injury, right knee, subsequent encounter: Secondary | ICD-10-CM | POA: Diagnosis not present

## 2021-08-16 DIAGNOSIS — F064 Anxiety disorder due to known physiological condition: Secondary | ICD-10-CM | POA: Diagnosis not present

## 2021-08-16 DIAGNOSIS — M25511 Pain in right shoulder: Secondary | ICD-10-CM | POA: Diagnosis not present

## 2021-08-16 DIAGNOSIS — M25769 Osteophyte, unspecified knee: Secondary | ICD-10-CM | POA: Diagnosis not present

## 2021-08-16 DIAGNOSIS — E7849 Other hyperlipidemia: Secondary | ICD-10-CM | POA: Diagnosis not present

## 2021-08-17 DIAGNOSIS — M25511 Pain in right shoulder: Secondary | ICD-10-CM | POA: Diagnosis not present

## 2021-08-21 DIAGNOSIS — M25511 Pain in right shoulder: Secondary | ICD-10-CM | POA: Diagnosis not present

## 2021-08-23 DIAGNOSIS — M25511 Pain in right shoulder: Secondary | ICD-10-CM | POA: Diagnosis not present

## 2021-08-28 DIAGNOSIS — M25511 Pain in right shoulder: Secondary | ICD-10-CM | POA: Diagnosis not present

## 2021-08-30 DIAGNOSIS — M25511 Pain in right shoulder: Secondary | ICD-10-CM | POA: Diagnosis not present

## 2021-09-04 DIAGNOSIS — M25511 Pain in right shoulder: Secondary | ICD-10-CM | POA: Diagnosis not present

## 2021-09-06 DIAGNOSIS — M25511 Pain in right shoulder: Secondary | ICD-10-CM | POA: Diagnosis not present

## 2021-09-12 DIAGNOSIS — M25511 Pain in right shoulder: Secondary | ICD-10-CM | POA: Diagnosis not present

## 2021-09-13 DIAGNOSIS — M5416 Radiculopathy, lumbar region: Secondary | ICD-10-CM | POA: Diagnosis not present

## 2021-09-13 DIAGNOSIS — Z6831 Body mass index (BMI) 31.0-31.9, adult: Secondary | ICD-10-CM | POA: Diagnosis not present

## 2021-09-13 DIAGNOSIS — M5412 Radiculopathy, cervical region: Secondary | ICD-10-CM | POA: Diagnosis not present

## 2021-09-14 DIAGNOSIS — M19011 Primary osteoarthritis, right shoulder: Secondary | ICD-10-CM | POA: Diagnosis not present

## 2021-09-15 DIAGNOSIS — M5416 Radiculopathy, lumbar region: Secondary | ICD-10-CM | POA: Diagnosis not present

## 2021-09-21 DIAGNOSIS — M25511 Pain in right shoulder: Secondary | ICD-10-CM | POA: Diagnosis not present

## 2021-09-22 DIAGNOSIS — Z1231 Encounter for screening mammogram for malignant neoplasm of breast: Secondary | ICD-10-CM | POA: Diagnosis not present

## 2021-09-26 ENCOUNTER — Other Ambulatory Visit: Payer: Self-pay | Admitting: Specialist

## 2021-09-26 ENCOUNTER — Other Ambulatory Visit (HOSPITAL_COMMUNITY): Payer: Self-pay | Admitting: Specialist

## 2021-09-26 DIAGNOSIS — M25561 Pain in right knee: Secondary | ICD-10-CM

## 2021-09-26 DIAGNOSIS — S83242A Other tear of medial meniscus, current injury, left knee, initial encounter: Secondary | ICD-10-CM | POA: Diagnosis not present

## 2021-09-26 DIAGNOSIS — S83241A Other tear of medial meniscus, current injury, right knee, initial encounter: Secondary | ICD-10-CM | POA: Diagnosis not present

## 2021-09-26 DIAGNOSIS — M25562 Pain in left knee: Secondary | ICD-10-CM

## 2021-09-26 DIAGNOSIS — H6123 Impacted cerumen, bilateral: Secondary | ICD-10-CM | POA: Diagnosis not present

## 2021-10-17 ENCOUNTER — Ambulatory Visit (HOSPITAL_COMMUNITY)
Admission: RE | Admit: 2021-10-17 | Discharge: 2021-10-17 | Disposition: A | Discharge status: Home / Self Care | Payer: Medicare Other | Source: Ambulatory Visit | Attending: Orthopedic Surgery | Admitting: Orthopedic Surgery

## 2021-10-17 DIAGNOSIS — M25561 Pain in right knee: Secondary | ICD-10-CM | POA: Insufficient documentation

## 2021-10-17 DIAGNOSIS — M25562 Pain in left knee: Secondary | ICD-10-CM

## 2021-10-24 NOTE — Progress Notes (Unsigned)
[facility] [place]                                     Cardiology Office Note:    Date:  10/26/2021   ID:  Grace Thomas, DOB [birth year], MRN [id number]  PCP:  Neale Burly, MD  [facility] Cardiologist:  Rozann Lesches, MD / Dr. Burt Knack, MD / Dr. Cyndia Bent, MD (TAVR)   [facility] Electrophysiologist:  None   Referring MD: Neale Burly, MD   1 year s/p TAVR   History of Present Illness:    Grace Thomas is a 71 y.o. female with a hx of GERD, anxiety and severe aortic stenosis s/p TAVR (11/01/20) who presents to clinic for follow up.   Grace Thomas was recently seen in consultation by Dr. Domenic Polite after an echocardiogram at her primary care office demonstrated severe aortic stenosis. This reportedly showed an LVEF greater than 55% with mild mitral regurgitation, mild aortic regurgitation, and severe calcific aortic stenosis with a mean transvalvular gradient of 38 mmHg and dimensionless index of 0.19. Given this, she was referred to Dr. Burt Knack for further evaluation and possible TAVR. At the time of consultation, she was having symptoms of chest heaviness and dyspnea. She underwent [facility] 09/05/20 which showed angiographically normal coronary arteries.   She was evaluated by the multidisciplinary valve team and underwent successful TAVR with a 23 mm Edwards Sapien 3 Ultra THV via the TF approach on 11/01/20. Post operative echo showed EF 55%, normally functioning TAVR with a mean gradient of 14 mmHg and trivial PVL. She was discharged home aspirin and plavix. Later Plavix was discontinued due to easy bruising and need to take Celebrex. 1 month echo showed EF 60%, normally functioning TAVR with a mean gradient of 8 mm hg and trivial PVL.   She did undergo cervical spine surgery in February 2023. Seen last by Dr. Domenic Polite in 04/2021 and doing well. She also recently had shoulder surgery.  Today the patient presents to clinic for  follow up. Here with daughter who wants her to live to 89. No CP or SOB. No LE edema, orthopnea or PND. No dizziness or syncope. No blood in stool or urine. No palpitations.  She is mostly limited by right leg pain.    Past Medical History:  Diagnosis Date   Anxiety    Aortic stenosis 11/01/2020   TAVR   Arthritis    Asthma due to seasonal allergies    COVID-26 July 2020   Recurrent UTI    S/P TAVR (transcatheter aortic valve replacement) 11/01/2020   Edwards 62m S3U TF approach with Dr. CLupe Carney   Past Surgical History:  Procedure Laterality Date   ABDOMINAL HYSTERECTOMY  1994   TAH BSO   ANTERIOR CERVICAL DECOMP/DISCECTOMY FUSION N/A 02/28/2021   Procedure: Cervical Four-Five Cervical Five-Six Anterior Cervical Decompression Fusion;  Surgeon: DKarsten Ro DO;  [place]  Service: Neurosurgery;  Laterality: N/A;  3C/RM 19   CARDIAC CATHETERIZATION     CARPAL TUNNEL RELEASE Bilateral    REVERSE SHOULDER ARTHROPLASTY Right 06/22/2021   Procedure: REVERSE SHOULDER ARTHROPLASTY;  Surgeon: VHiram Gash MD;  [place]  Service: Orthopedics;  Laterality: Right;   RIGHT/LEFT HEART CATH AND CORONARY ANGIOGRAPHY N/A 09/05/2020   Procedure: RIGHT/LEFT HEART CATH AND CORONARY ANGIOGRAPHY;  Surgeon: CBurt Knack  Legrand Como, MD;  Location: [place] [facility];  Service: Cardiovascular;  Laterality: N/A;   ROTATOR CUFF REPAIR Right    TEE WITHOUT CARDIOVERSION N/A 11/01/2020   Procedure: TRANSESOPHAGEAL ECHOCARDIOGRAM (TEE);  Surgeon: Sherren Mocha, MD;  Location: [facility];  Service: Open Heart Surgery;  Laterality: N/A;   TRANSCATHETER AORTIC VALVE REPLACEMENT, TRANSFEMORAL N/A 11/01/2020   Procedure: TRANSCATHETER AORTIC VALVE REPLACEMENT, TRANSFEMORAL;  Surgeon: Sherren Mocha, MD;  Location: [place];  Service: Open Heart Surgery;  Laterality: N/A;   TUBAL LIGATION      Current Medications: Current Meds  Medication Sig   ALPRAZolam (XANAX) 0.25 MG  tablet Take 0.125 mg by mouth daily as needed for anxiety (anxiety/nerves).   Ascorbic Acid (VITAMIN C PO) Take 2 tablets by mouth every evening.   aspirin EC 81 MG tablet Take 81 mg by mouth daily. Swallow whole.   celecoxib (CELEBREX) 100 MG capsule Take 100 mg by mouth 2 (two) times daily. Uses prn only   Cholecalciferol (VITAMIN D3) 50 MCG (2000 UT) TABS Take 2,000 Units by mouth every evening.   fexofenadine (ALLEGRA) 180 MG tablet Take 180 mg by mouth daily as needed for allergies or rhinitis.   fluconazole (DIFLUCAN) 150 MG tablet Take 150 mg by mouth every other day.   miconazole (MONISTAT 7) 2 % vaginal cream Place 1 Applicatorful vaginally at bedtime as needed (yeast infection).   Misc Natural Products (YEAST BALANCE ICS) CAPS Take 1 capsule by mouth every evening. AZO   triamcinolone (NASACORT) 55 MCG/ACT AERO nasal inhaler Place 2 sprays into the nose daily as needed (allergies.).   Zinc 50 MG TABS Take 50 mg by mouth every evening.   [DISCONTINUED] amoxicillin (AMOXIL) 500 MG tablet Take 1 tablet (500 mg total) by mouth as directed. Take 4 tablets by mouth 1 hour prior to dental procedures and cleanings     Allergies:   Patient has no known allergies.   Social History   Socioeconomic History   Marital status: Married    Spouse name: Not on file   Number of children: Not on file   Years of education: Not on file   Highest education level: Not on file  Occupational History   Not on file  Tobacco Use   Smoking status: Never   Smokeless tobacco: Never  Vaping Use   Vaping Use: Never used  Substance and Sexual Activity   Alcohol use: No   Drug use: No   Sexual activity: Yes    Birth control/protection: Surgical    Comment: Hyst  Other Topics Concern   Not on file  Social History Narrative   Not on file   Social Determinants of Health   Financial Resource Strain: Not on file  Food Insecurity: Not on file  Transportation Needs: Not on file  Physical Activity: Not  on file  Stress: Not on file  Social Connections: Not on file     Family History: The patient's family history includes Bone cancer in her sister; Colitis in her daughter; Heart failure in her mother; Pancreatic cancer in her sister; Stroke in her father.  ROS:   Please see the history of present illness.    All other systems reviewed and are negative.  EKGs/Labs/Other Studies Reviewed:    The following studies were reviewed today:  TAVR OPERATIVE NOTE     Date of Procedure:                11/01/2020   Preoperative Diagnosis:  Severe Aortic Stenosis    Postoperative Diagnosis:    Same    Procedure:        Transcatheter Aortic Valve Replacement - Percutaneous Right Transfemoral Approach             Edwards Sapien 3 Ultra RSLTHV (size 23 mm, model # 9755RSL serial # [id number])              Co-Surgeons:                        Gaye Pollack, MD and Sherren Mocha, MD  and Lenna Sciara, MD     Anesthesiologist:                  Hoy Morn, MD   Echocardiographer:              Marry Guan, MD   Pre-operative Echo Findings: Severe aortic stenosis Normal left ventricular systolic function   Post-operative Echo Findings: Trace paravalvular leak Normal left ventricular systolic function  _____________    Echo 11/02/20: IMPRESSIONS   1. 23 mm S3 TAVR. V max 2.4 m/s, MG 14 mmHG, EOA 1.58, DI 0.43. Trivial  paravalvular leak. No significant regurgitation. Normal prosthesis. The  aortic valve has been repaired/replaced. Aortic valve regurgitation is  trivial. There is a 23 mm Edwards  Sapien prosthetic (TAVR) valve present in the aortic position. Procedure  Date: 11/01/2020.   2. Left ventricular ejection fraction, by estimation, is 55 to 60%. The  left ventricle has normal function. The left ventricle has no regional  wall motion abnormalities. Left ventricular diastolic parameters are  consistent with Grade I diastolic  dysfunction (impaired relaxation).   3.  Right ventricular systolic function is normal. The right ventricular  size is normal. Tricuspid regurgitation signal is inadequate for assessing  PA pressure.   4. The mitral valve is rheumatic. Mild mitral valve regurgitation. No  evidence of mitral stenosis.   5. The inferior vena cava is normal in size with greater than 50%  respiratory variability, suggesting right atrial pressure of 3 mmHg.   __________________  Echo 12/07/20 IMPRESSIONS   1. Left ventricular ejection fraction, by estimation, is 60 to 65%. The  left ventricle has normal function. The left ventricle has no regional  wall motion abnormalities. Left ventricular diastolic parameters are  indeterminate.   2. Right ventricular systolic function is normal. The right ventricular  size is normal. There is normal pulmonary artery systolic pressure.   3. The mitral valve is rheumatic. Trivial mitral valve regurgitation.  Mild mitral stenosis. MG 55mHg at 82 bpm, MVA 2.0 cm^2 by PHT   4. The inferior vena cava is normal in size with greater than 50%  respiratory variability, suggesting right atrial pressure of 3 mmHg.   5. There is a 23 mm Edwards Sapien prosthetic, stented (TAVR) valve  present in the aortic position      Aortic valve regurgitation is trivial. Echo findings are consistent  with normal structure and function of the aortic valve prosthesis. Vmax  2.3 m/s, MG 12 mmHg, DI 0.41   __________________________  Echo 10/25/21 IMPRESSIONS   1. Left ventricular ejection fraction, by estimation, is 60 to 65%. The  left ventricle has normal function. The left ventricle has no regional  wall motion abnormalities. Left ventricular diastolic parameters were  normal.   2. Right ventricular systolic function is normal. The right ventricular  size is normal. There is normal pulmonary artery systolic pressure.  3. MV is mildly thickened with minimally restricted motion.. The mitral  valve is abnormal. Mild mitral valve  regurgitation.   4. S/p TAVR (23 mm Sapien prosthesis, procedure date 11/01/20) Peak and  mean gradients through the valve are 15 and 9 mm Hg respectively AVA (VTI)  is 1.25 cm2 . No significant change from report in November 2022.. The  aortic valve has been  repaired/replaced. Aortic valve regurgitation is not visualized. There is  a 23 mm Sapien prosthetic (TAVR) valve present in the aortic position.  Procedure Date: 11/01/2020.   5. The inferior vena cava is normal in size with greater than 50%  respiratory variability, suggesting right atrial pressure of 3 mmHg.   EKG:  EKG is NOT ordered today.    Recent Labs: 10/28/2020: ALT 20 02/23/2021: BUN 13; Creatinine, Ser 0.88; Hemoglobin 13.4; Platelets 257; Potassium 4.0; Sodium 138  Recent Lipid Panel No results found for: "CHOL", "TRIG", "HDL", "CHOLHDL", "VLDL", "LDLCALC", "LDLDIRECT"   Risk Assessment/Calculations:       Physical Exam:    VS:  BP 114/64   Pulse 71   Ht '5\' 3"'$  (1.6 m)   Wt 177 lb 12.8 oz (80.6 kg)   SpO2 94%   BMI 31.50 kg/m     Wt Readings from Last 3 Encounters:  10/25/21 177 lb 12.8 oz (80.6 kg)  06/22/21 172 lb 6.4 oz (78.2 kg)  04/26/21 174 lb 9.6 oz (79.2 kg)     GEN:  Well nourished, well developed in no acute distress HEENT: Normal NECK: No JVD LYMPHATICS: No lymphadenopathy CARDIAC: RRR, no murmurs, rubs, gallops RESPIRATORY:  Clear to auscultation without rales, wheezing or rhonchi  ABDOMEN: Soft, non-tender, non-distended MUSCULOSKELETAL:  No edema; No deformity  SKIN: Warm and dry.   NEUROLOGIC:  Alert and oriented x 3 PSYCHIATRIC:  Normal affect   ASSESSMENT:    1. S/P TAVR (transcatheter aortic valve replacement)   2. Gastroesophageal reflux disease, unspecified whether esophagitis present   3. Arthritis   4. Pain of right lower extremity    PLAN:    In order of problems listed above:  Severe AS s/p TAVR: echo today shows EF 60%, normally functioning TAVR with a mean  gradient of 9 mm hg and trivial PVL as well as mild MR. She has NYHA class I symptoms. She will continue on aspirin indefinitely and has amoxicillin for SBE prophylaxis. Continue regular follow up with Dr. Domenic Polite  GERD: continue protonix.   Arthritis: continue on Celebrex  Leg pain: she is having a lot of trouble with her right leg and may require surgery. She is cleared from a cardiac standpoint if that is required.    Medication Adjustments/Labs and Tests Ordered: Current medicines are reviewed at length with the patient today.  Concerns regarding medicines are outlined above.  No orders of the defined types were placed in this encounter.    Meds ordered this encounter  Medications   amoxicillin (AMOXIL) 500 MG tablet    Sig: Take 1 tablet (500 mg total) by mouth as directed. Take 4 tablets by mouth 1 hour prior to dental procedures and cleanings    Dispense:  12 tablet    Refill:  3      Patient Instructions  Medication Instructions:  NO CHANGES *If you need a refill on your cardiac medications before your next appointment, please call your pharmacy*   Lab Work: NONE If you have labs (blood work) drawn today and your tests are completely normal, you  will receive your results only by: [place] (if you have MyChart) OR A paper copy in the mail If you have any lab test that is abnormal or we need to change your treatment, we will call you to review the results.   Testing/Procedures: NONE   Follow-Up: At [facility], you and your health needs are our priority.  As part of our continuing mission to provide you with exceptional heart care, we have created designated Provider Care Teams.  These Care Teams include your primary Cardiologist (physician) and Advanced Practice Providers (APPs -  Physician Assistants and Nurse Practitioners) who all work together to provide you with the care you need, when you need it.  We recommend signing up for the patient  portal called "MyChart".  Sign up information is provided on this After Visit Summary.  MyChart is used to connect with patients for Virtual Visits (Telemedicine).  Patients are able to view lab/test results, encounter notes, upcoming appointments, etc.  Non-urgent messages can be sent to your provider as well.   To learn more about what you can do with MyChart, go to [email or web address].    Your next appointment:   FOLLOW UP WITH DR Ch[place] AS PLANNED DUE APRIL 2024   The format for your next appointment:     Provider:   IOther Instructions   Important Information About Sugar         Signed, Angelena Form, PA-C  10/26/2021 10:53 AM    [place]

## 2021-10-25 ENCOUNTER — Ambulatory Visit (HOSPITAL_COMMUNITY): Payer: Medicare Other | Attending: Physician Assistant | Admitting: Physician Assistant

## 2021-10-25 ENCOUNTER — Ambulatory Visit (HOSPITAL_BASED_OUTPATIENT_CLINIC_OR_DEPARTMENT_OTHER): Payer: Medicare Other

## 2021-10-25 VITALS — BP 114/64 | HR 71 | Ht 63.0 in | Wt 177.8 lb

## 2021-10-25 DIAGNOSIS — Z952 Presence of prosthetic heart valve: Secondary | ICD-10-CM

## 2021-10-25 DIAGNOSIS — M1990 Unspecified osteoarthritis, unspecified site: Secondary | ICD-10-CM | POA: Diagnosis not present

## 2021-10-25 DIAGNOSIS — I2510 Atherosclerotic heart disease of native coronary artery without angina pectoris: Secondary | ICD-10-CM

## 2021-10-25 DIAGNOSIS — K219 Gastro-esophageal reflux disease without esophagitis: Secondary | ICD-10-CM

## 2021-10-25 DIAGNOSIS — M79604 Pain in right leg: Secondary | ICD-10-CM

## 2021-10-25 DIAGNOSIS — M5416 Radiculopathy, lumbar region: Secondary | ICD-10-CM | POA: Diagnosis not present

## 2021-10-25 LAB — ECHOCARDIOGRAM COMPLETE
AR max vel: 1.04 cm2
AV Area VTI: 1.14 cm2
AV Area mean vel: 1.04 cm2
AV Mean grad: 9 mmHg
AV Peak grad: 16.8 mmHg
Ao pk vel: 2.05 m/s
Area-P 1/2: 2.14 cm2
S' Lateral: 2.4 cm

## 2021-10-25 MED ORDER — AMOXICILLIN 500 MG PO TABS: 500.0000 mg | ORAL_TABLET | ORAL | 3 refills | Status: DC

## 2021-10-25 NOTE — Patient Instructions (Signed)
Medication Instructions:  NO CHANGES *If you need a refill on your cardiac medications before your next appointment, please call your pharmacy*   Lab Work: NONE If you have labs (blood work) drawn today and your tests are completely normal, you will receive your results only by: [place] (if you have MyChart) OR A paper copy in the mail If you have any lab test that is abnormal or we need to change your treatment, we will call you to review the results.   Testing/Procedures: NONE   Follow-Up: At [facility], you and your health needs are our priority.  As part of our continuing mission to provide you with exceptional heart care, we have created designated Provider Care Teams.  These Care Teams include your primary Cardiologist (physician) and Advanced Practice Providers (APPs -  Physician Assistants and Nurse Practitioners) who all work together to provide you with the care you need, when you need it.  We recommend signing up for the patient portal called "MyChart".  Sign up information is provided on this After Visit Summary.  MyChart is used to connect with patients for Virtual Visits (Telemedicine).  Patients are able to view lab/test results, encounter notes, upcoming appointments, etc.  Non-urgent messages can be sent to your provider as well.   To learn more about what you can do with MyChart, go to [email or web address].    Your next appointment:   FOLLOW UP WITH DR Basye AS PLANNED DUE APRIL 2024   The format for your next appointment:     Provider:   IOther Instructions   Important Information About Sugar

## 2021-10-26 ENCOUNTER — Other Ambulatory Visit (HOSPITAL_COMMUNITY): Payer: Medicare Other

## 2021-11-02 DIAGNOSIS — S83241D Other tear of medial meniscus, current injury, right knee, subsequent encounter: Secondary | ICD-10-CM | POA: Diagnosis not present

## 2021-11-02 DIAGNOSIS — S83242A Other tear of medial meniscus, current injury, left knee, initial encounter: Secondary | ICD-10-CM | POA: Diagnosis not present

## 2021-11-15 ENCOUNTER — Encounter: Payer: Self-pay | Admitting: Gastroenterology

## 2021-11-21 DIAGNOSIS — M170 Bilateral primary osteoarthritis of knee: Secondary | ICD-10-CM | POA: Diagnosis not present

## 2021-11-28 DIAGNOSIS — M170 Bilateral primary osteoarthritis of knee: Secondary | ICD-10-CM | POA: Diagnosis not present

## 2021-12-05 DIAGNOSIS — M170 Bilateral primary osteoarthritis of knee: Secondary | ICD-10-CM | POA: Diagnosis not present

## 2021-12-18 ENCOUNTER — Ambulatory Visit (AMBULATORY_SURGERY_CENTER): Payer: Medicare Other | Admitting: *Deleted

## 2021-12-18 VITALS — Ht 63.0 in | Wt 182.4 lb

## 2021-12-18 DIAGNOSIS — Z86010 Personal history of colonic polyps: Secondary | ICD-10-CM

## 2021-12-18 MED ORDER — NA SULFATE-K SULFATE-MG SULF 17.5-3.13-1.6 GM/177ML PO SOLN: 1.0000 | Freq: Once | ORAL | 0 refills | Status: AC

## 2022-01-12 DIAGNOSIS — M1711 Unilateral primary osteoarthritis, right knee: Secondary | ICD-10-CM | POA: Diagnosis not present

## 2022-01-17 ENCOUNTER — Ambulatory Visit (AMBULATORY_SURGERY_CENTER): Payer: 59 | Admitting: Gastroenterology

## 2022-01-17 ENCOUNTER — Encounter: Payer: Self-pay | Admitting: Gastroenterology

## 2022-01-17 VITALS — BP 137/65 | HR 72 | Temp 97.1°F | Resp 12 | Ht 63.0 in | Wt 182.4 lb

## 2022-01-17 DIAGNOSIS — Z86010 Personal history of colonic polyps: Secondary | ICD-10-CM | POA: Diagnosis not present

## 2022-01-17 DIAGNOSIS — D123 Benign neoplasm of transverse colon: Secondary | ICD-10-CM

## 2022-01-17 DIAGNOSIS — K635 Polyp of colon: Secondary | ICD-10-CM

## 2022-01-17 DIAGNOSIS — Z09 Encounter for follow-up examination after completed treatment for conditions other than malignant neoplasm: Secondary | ICD-10-CM | POA: Diagnosis not present

## 2022-01-17 DIAGNOSIS — D120 Benign neoplasm of cecum: Secondary | ICD-10-CM

## 2022-01-17 DIAGNOSIS — F419 Anxiety disorder, unspecified: Secondary | ICD-10-CM | POA: Diagnosis not present

## 2022-01-17 DIAGNOSIS — K6389 Other specified diseases of intestine: Secondary | ICD-10-CM | POA: Diagnosis not present

## 2022-01-17 MED ORDER — SODIUM CHLORIDE 0.9 % IV SOLN: 500.0000 mL | Freq: Once | INTRAVENOUS | Status: DC

## 2022-01-17 NOTE — Progress Notes (Signed)
History and Physical:  This patient presents for endoscopic testing for: Encounter Diagnosis  Name Primary?   Personal history of colonic polyps Yes    72 year old woman here today for surveillance colonoscopy. April 2018 -12 mm sigmoid colon TSA, 6 mm rectal TA Patient denies chronic abdominal pain, rectal bleeding, constipation or diarrhea.   Patient is otherwise without complaints or active issues today.   Past Medical History: Past Medical History:  Diagnosis Date   Anxiety    Aortic stenosis 11/01/2020   TAVR   Arthritis    Asthma due to seasonal allergies    Pt.denies 12/18/21   Back pain    COVID-26 July 2020   GERD (gastroesophageal reflux disease)    " a little"   Heart murmur    Knee pain    had gel shots for bilateral 12/05/21 was last injection   Recurrent UTI    S/P TAVR (transcatheter aortic valve replacement) 11/01/2020   Edwards 53m S3U TF approach with Dr. CLupe Carney    Past Surgical History: Past Surgical History:  Procedure Laterality Date   ABDOMINAL HYSTERECTOMY  1994   TAH BSO   ANTERIOR CERVICAL DECOMP/DISCECTOMY FUSION N/A 02/28/2021   Procedure: Cervical Four-Five Cervical Five-Six Anterior Cervical Decompression Fusion;  Surgeon: DKarsten Ro DO;  [place]  Service: Neurosurgery;  Laterality: N/A;  3C/RM 19   CARDIAC CATHETERIZATION     CARPAL TUNNEL RELEASE Bilateral    COLONOSCOPY     REVERSE SHOULDER ARTHROPLASTY Right 06/22/2021   Procedure: REVERSE SHOULDER ARTHROPLASTY;  Surgeon: VHiram Gash MD;  [place] [facility]  Service: Orthopedics;  Laterality: Right;   RIGHT/LEFT HEART CATH AND CORONARY ANGIOGRAPHY N/A 09/05/2020   Procedure: RIGHT/LEFT HEART CATH AND CORONARY ANGIOGRAPHY;  Surgeon: CSherren Mocha MD;  [facility];  Service: Cardiovascular;  Laterality: N/A;   ROTATOR CUFF REPAIR Right    TEE WITHOUT CARDIOVERSION N/A 11/01/2020   Procedure: TRANSESOPHAGEAL  ECHOCARDIOGRAM (TEE);  Surgeon: CSherren Mocha MD;  [place] [facility]  Service: Open Heart Surgery;  Laterality: N/A;   TRANSCATHETER AORTIC VALVE REPLACEMENT, TRANSFEMORAL N/A 11/01/2020   Procedure: TRANSCATHETER AORTIC VALVE REPLACEMENT, TRANSFEMORAL;  Surgeon: CSherren Mocha MD;  [place]  Service: Open Heart Surgery;  Laterality: N/A;   TUBAL LIGATION      Allergies: No Known Allergies  Outpatient Meds: Current Outpatient Medications  Medication Sig Dispense Refill   acetaminophen (TYLENOL) 500 MG tablet Take 500 mg by mouth every 4 (four) hours as needed for mild pain.     ALPRAZolam (XANAX) 0.25 MG tablet Take 0.125 mg by mouth daily as needed for anxiety (anxiety/nerves).     Ascorbic Acid (VITAMIN C PO) Take 2 tablets by mouth every evening.     aspirin EC 81 MG tablet Take 81 mg by mouth daily. Swallow whole.     Cholecalciferol (VITAMIN D3) 50 MCG (2000 UT) TABS Take 2,000 Units by mouth every evening.     Cyanocobalamin (VITAMIN B-12 PO) Take 5,000 mcg by mouth.     fexofenadine (ALLEGRA) 180 MG tablet Take 180 mg by mouth daily as needed for allergies or rhinitis.     Zinc 50 MG TABS Take 50 mg by mouth every evening.     amoxicillin (AMOXIL) 500 MG tablet Take 1 tablet (500 mg total) by mouth as directed. Take 4 tablets by mouth 1 hour prior to dental procedures and cleanings (Patient not taking: Reported on 12/18/2021) 12  tablet 3   celecoxib (CELEBREX) 100 MG capsule Take 100 mg by mouth 2 (two) times daily. Uses prn only     fluconazole (DIFLUCAN) 150 MG tablet Take 150 mg by mouth every other day.     miconazole (MONISTAT 7) 2 % vaginal cream Place 1 Applicatorful vaginally at bedtime as needed (yeast infection).     Misc Natural Products (YEAST BALANCE ICS) CAPS Take 1 capsule by mouth every evening. AZO     triamcinolone (NASACORT) 55 MCG/ACT AERO nasal inhaler Place 2 sprays into the nose daily as needed (allergies.).     Current Facility-Administered  Medications  Medication Dose Route Frequency Provider Last Rate Last Admin   0.9 %  sodium chloride infusion  500 mL Intravenous Once Danis, Estill Cotta III, MD          ___________________________________________________________________ Objective   Exam:  BP (!) 147/78   Pulse 80   Temp (!) 97.1 F (36.2 C) (Temporal)   Ht '5\' 3"'$  (1.6 m)   Wt 182 lb 6.4 oz (82.7 kg)   SpO2 98%   BMI 32.31 kg/m   CV: regular , S1/S2 Resp: clear to auscultation bilaterally, normal RR and effort noted GI: soft, no tenderness, with active bowel sounds.   Assessment: Encounter Diagnosis  Name Primary?   Personal history of colonic polyps Yes     Plan: Colonoscopy  The benefits and risks of the planned procedure were described in detail with the patient or (when appropriate) their health care proxy.  Risks were outlined as including, but not limited to, bleeding, infection, perforation, adverse medication reaction leading to cardiac or pulmonary decompensation, pancreatitis (if ERCP).  The limitation of incomplete mucosal visualization was also discussed.  No guarantees or warranties were given.    The patient is appropriate for an endoscopic procedure in the ambulatory setting.   - Wilfrid Lund, MD

## 2022-01-17 NOTE — Op Note (Signed)
[facility] Patient Name: Grace Thomas Procedure Date: 01/17/2022 9:13 AM MRN: [id number] Endoscopist: [place]. Loletha Carrow , MD, [id number] Age: 72 Referring MD:  Date of Birth: [birth year] Gender: Female Account #: [id number] Procedure:                Colonoscopy Indications:              Surveillance: Personal history of adenomatous                            polyps on last colonoscopy > 3 years ago                           April 2019 -12 mm sigmoid TSA, 6 mm rectal TA Medicines:                Monitored Anesthesia Care Procedure:                Pre-Anesthesia Assessment:                           - Prior to the procedure, a History and Physical                            was performed, and patient medications and                            allergies were reviewed. The patient's tolerance of                            previous anesthesia was also reviewed. The risks                            and benefits of the procedure and the sedation                            options and risks were discussed with the patient.                            All questions were answered, and informed consent                            was obtained. Prior Anticoagulants: The patient has                            taken no anticoagulant or antiplatelet agents. ASA                            Grade Assessment: III - A patient with severe                            systemic disease. After reviewing the risks and                            benefits, the patient was deemed in satisfactory  condition to undergo the procedure.                           After obtaining informed consent, the colonoscope                            was passed under direct vision. Throughout the                            procedure, the patient's blood pressure, pulse, and                            oxygen saturations were monitored continuously. The                            CF [id number] #[id number] was  introduced through the anus                            and advanced to the the cecum, identified by                            appendiceal orifice and ileocecal valve. The                            colonoscopy was performed without difficulty. The                            patient tolerated the procedure well. The quality                            of the bowel preparation was good. The ileocecal                            valve, appendiceal orifice, and rectum were                            photographed. Scope In: 9:19:42 AM Scope Out: 9:33:54 AM Scope Withdrawal Time: 0 hours 11 minutes 28 seconds  Total Procedure Duration: 0 hours 14 minutes 12 seconds  Findings:                 The perianal and digital rectal examinations were                            normal.                           An 8 mm polyp was found in the cecum. The polyp was                            semi-sessile. The polyp was removed with a cold                            snare. Resection and retrieval were complete.  A 27m mucus-capped polyp was found in the proximal                            transverse colon. The polyp was sessile. The polyp                            was removed with a cold snare. Resection and                            retrieval were complete.                           Repeat examination of right colon under NBI                            performed.                           Internal hemorrhoids were found.                           The exam was otherwise without abnormality on                            direct and retroflexion views. Complications:            No immediate complications. Estimated Blood Loss:     Estimated blood loss was minimal. Impression:               - One 8 mm polyp in the cecum, removed with a cold                            snare. Resected and retrieved.                           - One 5 mm polyp in the proximal transverse colon,                             removed with a cold snare. Resected and retrieved.                           - Internal hemorrhoids.                           - The examination was otherwise normal on direct                            and retroflexion views. Recommendation:           - Patient has a contact number available for                            emergencies. The signs and symptoms of potential                            delayed complications were discussed with the  patient. Return to normal activities tomorrow.                            Written discharge instructions were provided to the                            patient.                           - Resume previous diet.                           - Continue present medications.                           - Await pathology results.                           - Repeat colonoscopy in 5 years for surveillance. Aradhana Gin L. Loletha Carrow, MD 01/17/2022 9:41:50 AM This report has been signed electronically.

## 2022-01-17 NOTE — Progress Notes (Signed)
To pacu, VSS. Report to rn.tb °

## 2022-01-17 NOTE — Progress Notes (Signed)
Called to room to assist during endoscopic procedure.  Patient ID and intended procedure confirmed with present staff. Received instructions for my participation in the procedure from the performing physician.  

## 2022-01-17 NOTE — Patient Instructions (Signed)
    Handouts on polyps & hemorrhoids given to you today   Await pathology results on polyps removed     YOU HAD AN ENDOSCOPIC PROCEDURE TODAY AT [facility]:   Refer to the procedure report that was given to you for any specific questions about what was found during the examination.  If the procedure report does not answer your questions, please call your gastroenterologist to clarify.  If you requested that your care partner not be given the details of your procedure findings, then the procedure report has been included in a sealed envelope for you to review at your convenience later.  YOU SHOULD EXPECT: Some feelings of bloating in the abdomen. Passage of more gas than usual.  Walking can help get rid of the air that was put into your GI tract during the procedure and reduce the bloating. If you had a lower endoscopy (such as a colonoscopy or flexible sigmoidoscopy) you may notice spotting of blood in your stool or on the toilet paper. If you underwent a bowel prep for your procedure, you may not have a normal bowel movement for a few days.  Please Note:  You might notice some irritation and congestion in your nose or some drainage.  This is from the oxygen used during your procedure.  There is no need for concern and it should clear up in a day or so.  SYMPTOMS TO REPORT IMMEDIATELY:  Following lower endoscopy (colonoscopy or flexible sigmoidoscopy):  Excessive amounts of blood in the stool  Significant tenderness or worsening of abdominal pains  Swelling of the abdomen that is new, acute  Fever of 100F or higher  For urgent or emergent issues, a gastroenterologist can be reached at any hour by calling [phone]. Do not use MyChart messaging for urgent concerns.    DIET:  We do recommend a small meal at first, but then you may proceed to your regular diet.  Drink plenty of fluids but you should avoid alcoholic beverages for 24 hours.  ACTIVITY:  You should plan  to take it easy for the rest of today and you should NOT DRIVE or use heavy machinery until tomorrow (because of the sedation medicines used during the test).    FOLLOW UP: Our staff will call the number listed on your records the next business day following your procedure.  We will call around 7:15- 8:00 am to check on you and address any questions or concerns that you may have regarding the information given to you following your procedure. If we do not reach you, we will leave a message.     If any biopsies were taken you will be contacted by phone or by letter within the next 1-3 weeks.  Please call us at [phone] if you have not heard about the biopsies in 3 weeks.    SIGNATURES/CONFIDENTIALITY: You and/or your care partner have signed paperwork which will be entered into your electronic medical record.  These signatures attest to the fact that that the information above on your After Visit Summary has been reviewed and is understood.  Full responsibility of the confidentiality of this discharge information lies with you and/or your care-partner.

## 2022-01-18 ENCOUNTER — Telehealth: Payer: Self-pay

## 2022-01-18 NOTE — Telephone Encounter (Signed)
  Follow up Call-     01/17/2022    8:04 AM  Call back number  Post procedure Call Back phone  # [phone]  Permission to leave phone message Yes     Patient questions:  Do you have a fever, pain , or abdominal swelling? No. Pain Score  0 *  Have you tolerated food without any problems? Yes.    Have you been able to return to your normal activities? Yes.    Do you have any questions about your discharge instructions: Diet   No. Medications  No. Follow up visit  No.  Do you have questions or concerns about your Care? No.  Actions: * If pain score is 4 or above: No action needed, pain <4.

## 2022-01-23 ENCOUNTER — Encounter: Payer: Self-pay | Admitting: Gastroenterology

## 2022-01-24 DIAGNOSIS — M461 Sacroiliitis, not elsewhere classified: Secondary | ICD-10-CM | POA: Diagnosis not present

## 2022-01-24 DIAGNOSIS — Z6832 Body mass index (BMI) 32.0-32.9, adult: Secondary | ICD-10-CM | POA: Diagnosis not present

## 2022-02-01 DIAGNOSIS — M461 Sacroiliitis, not elsewhere classified: Secondary | ICD-10-CM | POA: Diagnosis not present

## 2022-03-05 DIAGNOSIS — G8929 Other chronic pain: Secondary | ICD-10-CM | POA: Diagnosis not present

## 2022-03-05 DIAGNOSIS — M25551 Pain in right hip: Secondary | ICD-10-CM | POA: Diagnosis not present

## 2022-03-27 ENCOUNTER — Other Ambulatory Visit (HOSPITAL_COMMUNITY): Payer: Self-pay | Admitting: Neurological Surgery

## 2022-03-27 DIAGNOSIS — G8929 Other chronic pain: Secondary | ICD-10-CM

## 2022-04-25 ENCOUNTER — Ambulatory Visit (HOSPITAL_COMMUNITY)
Admission: RE | Admit: 2022-04-25 | Discharge: 2022-04-25 | Disposition: A | Discharge status: Home / Self Care | Payer: Medicare Other | Source: Ambulatory Visit | Attending: Neurological Surgery | Admitting: Neurological Surgery

## 2022-04-25 DIAGNOSIS — M25551 Pain in right hip: Secondary | ICD-10-CM | POA: Insufficient documentation

## 2022-04-25 DIAGNOSIS — M1611 Unilateral primary osteoarthritis, right hip: Secondary | ICD-10-CM | POA: Diagnosis not present

## 2022-04-25 DIAGNOSIS — G8929 Other chronic pain: Secondary | ICD-10-CM | POA: Insufficient documentation

## 2022-04-30 DIAGNOSIS — Z6832 Body mass index (BMI) 32.0-32.9, adult: Secondary | ICD-10-CM | POA: Diagnosis not present

## 2022-04-30 DIAGNOSIS — M5416 Radiculopathy, lumbar region: Secondary | ICD-10-CM | POA: Diagnosis not present

## 2022-05-01 ENCOUNTER — Other Ambulatory Visit (HOSPITAL_COMMUNITY): Payer: Self-pay | Admitting: Neurological Surgery

## 2022-05-01 DIAGNOSIS — M5416 Radiculopathy, lumbar region: Secondary | ICD-10-CM

## 2022-05-01 DIAGNOSIS — M170 Bilateral primary osteoarthritis of knee: Secondary | ICD-10-CM | POA: Diagnosis not present

## 2022-05-02 ENCOUNTER — Ambulatory Visit: Payer: Medicare Other | Attending: Cardiology | Admitting: Cardiology

## 2022-05-02 ENCOUNTER — Encounter: Payer: Self-pay | Admitting: Cardiology

## 2022-05-02 VITALS — BP 124/82 | HR 89 | Wt 183.0 lb

## 2022-05-02 DIAGNOSIS — Z952 Presence of prosthetic heart valve: Secondary | ICD-10-CM | POA: Insufficient documentation

## 2022-05-02 NOTE — Patient Instructions (Signed)
Medication Instructions:  Your physician recommends that you continue on your current medications as directed. Please refer to the Current Medication list given to you today.  *If you need a refill on your cardiac medications before your next appointment, please call your pharmacy*   Lab Work: None If you have labs (blood work) drawn today and your tests are completely normal, you will receive your results only by: MyChart Message (if you have MyChart) OR A paper copy in the mail If you have any lab test that is abnormal or we need to change your treatment, we will call you to review the results.   Testing/Procedures: None   Follow-Up: At [facility], you and your health needs are our priority.  As part of our continuing mission to provide you with exceptional heart care, we have created designated Provider Care Teams.  These Care Teams include your primary Cardiologist (physician) and Advanced Practice Providers (APPs -  Physician Assistants and Nurse Practitioners) who all work together to provide you with the care you need, when you need it.  We recommend signing up for the patient portal called "MyChart".  Sign up information is provided on this After Visit Summary.  MyChart is used to connect with patients for Virtual Visits (Telemedicine).  Patients are able to view lab/test results, encounter notes, upcoming appointments, etc.  Non-urgent messages can be sent to your provider as well.   To learn more about what you can do with MyChart, go to [email or web address].    Your next appointment:   1 year(s)  Provider:   Samuel McDowell, MD    Other Instructions    

## 2022-05-02 NOTE — Progress Notes (Signed)
    Cardiology Office Note  Date: 05/02/2022   ID: BREELYN ICARD, DOB Sep 29, 1950, MRN [id number]  History of Present Illness: Grace Thomas is a 72 y.o. female last seen in April 2023.  She did have interval follow-up in the multidisciplinary heart valve clinic in October 2023.  She is here for a routine visit.  Overall stable from a cardiac perspective, no exertional angina, NYHA class II dyspnea.  No palpitations or syncope.  I reviewed her medications, she remains on low-dose aspirin, no spontaneous bleeding problems reported.  ECG today is normal.  Follow-up echocardiogram in October 2023 reviewed with normal TAVR function.  Physical Exam: VS:  BP 124/82   Pulse 89   Wt 183 lb (83 kg)   SpO2 94%   BMI 32.42 kg/m , BMI Body mass index is 32.42 kg/m.  Wt Readings from Last 3 Encounters:  05/02/22 183 lb (83 kg)  01/17/22 182 lb 6.4 oz (82.7 kg)  12/18/21 182 lb 6.4 oz (82.7 kg)    General: Patient appears comfortable at rest. HEENT: Conjunctiva and lids normal. Neck: Supple, no elevated JVP or carotid bruits. Lungs: Clear to auscultation, nonlabored breathing at rest. Cardiac: Regular rate and rhythm, no S3, 2/6 systolic murmur. Extremities: No pitting edema.  ECG:  An ECG dated 11/09/2020 was personally reviewed today and demonstrated:  Sinus rhythm.  Labwork:  August 2023: Cholesterol 220, triglycerides 101, HDL 68, LDL 134, BUN 19, creatinine 0.62, potassium 4.0  Other Studies Reviewed Today:  Echocardiogram 10/25/2021:  1. Left ventricular ejection fraction, by estimation, is 60 to 65%. The  left ventricle has normal function. The left ventricle has no regional  wall motion abnormalities. Left ventricular diastolic parameters were  normal.   2. Right ventricular systolic function is normal. The right ventricular  size is normal. There is normal pulmonary artery systolic pressure.   3. MV is mildly thickened with minimally restricted motion.. The mitral  valve  is abnormal. Mild mitral valve regurgitation.   4. S/p TAVR (23 mm Sapien prosthesis, procedure date 11/01/20) Peak and  mean gradients through the valve are 15 and 9 mm Hg respectively AVA (VTI)  is 1.25 cm2 . No significant change from report in November 2022.. The  aortic valve has been  repaired/replaced. Aortic valve regurgitation is not visualized. There is  a 23 mm Sapien prosthetic (TAVR) valve present in the aortic position.  Procedure Date: 11/01/2020.   5. The inferior vena cava is normal in size with greater than 50%  respiratory variability, suggesting right atrial pressure of 3 mmHg.   Assessment and Plan:  1.  Severe aortic stenosis status post TAVR with 23 mm Carlos American in October 2022.  Echocardiogram in October 2023 revealed LVEF 60 to 65% with stable TAVR prosthesis, mean AV gradient 9 mmHg and no paravalvular regurgitation.  Continue low-dose aspirin.  She will follow-up with echocardiogram later this year and then see me back in 1 year.  2.  Normal coronary arteries documented at cardiac catheterization in August 2022.  Disposition:  Follow up  1 year.  Signed, Jonelle Sidle, M.D., F.A.C.C. [facility] at [facility]

## 2022-05-22 ENCOUNTER — Ambulatory Visit (HOSPITAL_COMMUNITY): Admission: RE | Admit: 2022-05-22 | Payer: Medicare Other | Source: Ambulatory Visit

## 2022-05-23 ENCOUNTER — Ambulatory Visit (HOSPITAL_COMMUNITY): Payer: Medicare Other

## 2022-05-23 ENCOUNTER — Encounter (HOSPITAL_COMMUNITY): Payer: Self-pay

## 2022-05-29 NOTE — Progress Notes (Signed)
72 y.o. G2P2 Married Caucasian female here for breast and pelvic exam. Pt wants to discuss cyst near labia.  She also has a mole under her left arm.   She has a Armed forces operational officer, Dr. Margo Aye, in [place].   She states I found a heart murmur at her last visit and this lead to an aortic valve replacement.   She states occasional yeast infection and uses OTC Monistat, which helps.   Having some low back and knee problems.  Seeing her orthopedist.   PCP:   Lia Hopping, MD  No LMP recorded. Patient has had a hysterectomy.           Sexually active: No.  The current method of family planning is status post hysterectomy.    Exercising: No.   Recovering from surgery Smoker:  no  Health Maintenance: Pap:  2014 normal per pt History of abnormal Pap:  no MMG:   09/22/21 BI-RADS CAT 1 neg--[facility]  Colonoscopy:  01/17/22 BMD:   10/19/19  Result  normal TDaP:  PCP Gardasil:   no HIV: n/a Hep C: n/a Screening Labs:  PCP   reports that she has never smoked. She has been exposed to tobacco smoke. She has never used smokeless tobacco. She reports that she does not drink alcohol and does not use drugs.  Past Medical History:  Diagnosis Date   Anxiety    Aortic stenosis 11/01/2020   TAVR   Arthritis    Asthma due to seasonal allergies    Pt.denies 12/18/21   Back pain    COVID-26 July 2020   GERD (gastroesophageal reflux disease)    " a little"   Heart murmur    Knee pain    had gel shots for bilateral 12/05/21 was last injection   Recurrent UTI    S/P TAVR (transcatheter aortic valve replacement) 11/01/2020   Edwards 23mm S3U TF approach with Dr. Edison Pace    Past Surgical History:  Procedure Laterality Date   ABDOMINAL HYSTERECTOMY  1994   TAH BSO   ANTERIOR CERVICAL DECOMP/DISCECTOMY FUSION N/A 02/28/2021   Procedure: Cervical Four-Five Cervical Five-Six Anterior Cervical Decompression Fusion;  Surgeon: Bethann Goo, DO;  Location: [facility] OR;  Service: Neurosurgery;   Laterality: N/A;  3C/RM 19   CARDIAC CATHETERIZATION     CARPAL TUNNEL RELEASE Bilateral    COLONOSCOPY     REVERSE SHOULDER ARTHROPLASTY Right 06/22/2021   Procedure: REVERSE SHOULDER ARTHROPLASTY;  Surgeon: Bjorn Pippin, MD;  Location: [facility];  Service: Orthopedics;  Laterality: Right;   RIGHT/LEFT HEART CATH AND CORONARY ANGIOGRAPHY N/A 09/05/2020   Procedure: RIGHT/LEFT HEART CATH AND CORONARY ANGIOGRAPHY;  Surgeon: Tonny Bollman, MD;  Location: [facility] INVASIVE CV LAB;  Service: Cardiovascular;  Laterality: N/A;   ROTATOR CUFF REPAIR Right    TEE WITHOUT CARDIOVERSION N/A 11/01/2020   Procedure: TRANSESOPHAGEAL ECHOCARDIOGRAM (TEE);  Surgeon: Tonny Bollman, MD;  Location: [facility] OR;  Service: Open Heart Surgery;  Laterality: N/A;   TRANSCATHETER AORTIC VALVE REPLACEMENT, TRANSFEMORAL N/A 11/01/2020   Procedure: TRANSCATHETER AORTIC VALVE REPLACEMENT, TRANSFEMORAL;  Surgeon: Tonny Bollman, MD;  Location: [facility] OR;  Service: Open Heart Surgery;  Laterality: N/A;   TUBAL LIGATION      Current Outpatient Medications  Medication Sig Dispense Refill   acetaminophen (TYLENOL) 500 MG tablet Take 500 mg by mouth every 4 (four) hours as needed for mild pain.     ALPRAZolam (XANAX) 0.25 MG tablet Take 0.125 mg by mouth daily as  needed for anxiety (anxiety/nerves).     amoxicillin (AMOXIL) 500 MG capsule SMARTSIG:4 Capsule(s) By Mouth     Ascorbic Acid (VITAMIN C PO) Take 2 tablets by mouth every evening.     aspirin EC 81 MG tablet Take 81 mg by mouth daily. Swallow whole.     celecoxib (CELEBREX) 100 MG capsule Take 100 mg by mouth 2 (two) times daily. Uses prn only     Cholecalciferol (VITAMIN D3) 50 MCG (2000 UT) TABS Take 2,000 Units by mouth every evening.     Cyanocobalamin (VITAMIN B-12 PO) Take 5,000 mcg by mouth.     fexofenadine (ALLEGRA) 180 MG tablet Take 180 mg by mouth daily as needed for allergies or rhinitis.     HYDROcodone-acetaminophen (NORCO/VICODIN) 5-325 MG  tablet Take by mouth.     miconazole (MONISTAT 7) 2 % vaginal cream Place 1 Applicatorful vaginally at bedtime as needed (yeast infection).     Misc Natural Products (YEAST BALANCE ICS) CAPS Take 1 capsule by mouth every evening. AZO     triamcinolone (NASACORT) 55 MCG/ACT AERO nasal inhaler Place 2 sprays into the nose daily as needed (allergies.).     Zinc 50 MG TABS Take 50 mg by mouth every evening.     No current facility-administered medications for this visit.    Family History  Problem Relation Age of Onset   Heart failure Mother    Stroke Father    Bone cancer Sister    Pancreatic cancer Sister    Colitis Daughter    Colon cancer Neg Hx    Colon polyps Neg Hx    Crohn's disease Neg Hx    Esophageal cancer Neg Hx    Rectal cancer Neg Hx    Stomach cancer Neg Hx     Review of Systems  All other systems reviewed and are negative.   Exam:   BP 118/82 (BP Location: Left Arm, Patient Position: Sitting, Cuff Size: Normal)   Pulse 81   Ht 5\' 4"  (1.626 m)   Wt 184 lb (83.5 kg)   SpO2 95%   BMI 31.58 kg/m     General appearance: alert, cooperative and appears stated age Head: normocephalic, without obvious abnormality, atraumatic Neck: no adenopathy, supple, symmetrical, trachea midline and thyroid normal to inspection and palpation Lungs: clear to auscultation bilaterally Breasts: normal appearance, no masses or tenderness, No nipple retraction or dimpling, No nipple discharge or bleeding, No axillary adenopathy Heart: regular rate and rhythm Abdomen: soft, non-tender; no masses, no organomegaly Extremities: extremities normal, atraumatic, no cyanosis or edema Skin: skin color, texture, turgor normal. No rashes or lesions.  Left axillary 1 cm raised dry mole and left anterior abdominal wall 5 mm raised dry mole, both consistent with probable seborrheic keratosis.  Lymph nodes: cervical, supraclavicular, and axillary nodes normal. Neurologic: grossly normal  Pelvic:  External genitalia:  left labia majora with 7 mm sebaceous cyst, nontender.              No abnormal inguinal nodes palpated.              Urethra:  normal appearing urethra with no masses, tenderness or lesions              Bartholins and Skenes: normal                 Vagina: normal appearing vagina with normal color and discharge, no lesions  Cervix:                Pap taken: no Bimanual Exam:  Uterus:  absent              Adnexa: no mass, fullness, tenderness              Rectal exam: yes.  Confirms.              Anus:  normal sphincter tone, no lesions  Chaperone was present for exam:  Warren Lacy, CMA  Assessment:   Well woman visit with gynecologic exam. Status post TAH/BSO.  Status post aortic valve replacement.  Sebaceous cyst.  Seborrheic keratosis.   Plan: Mammogram screening discussed. Self breast awareness reviewed. Pap and HR HPV not indicated.  Guidelines for Calcium, Vitamin D, regular exercise program including cardiovascular and weight bearing exercise. We discussed sebaceous cysts and seborrheic keratosis, the later of which she can consult her dermatologist.  Follow up in 2 years and prn.  After visit summary provided.   20 min  total time was spent for this patient encounter, including preparation, face-to-face counseling with the patient, coordination of care, and documentation of the encounter in addition to doing breast and pelvic exam.

## 2022-05-30 ENCOUNTER — Ambulatory Visit (HOSPITAL_COMMUNITY)
Admission: RE | Admit: 2022-05-30 | Discharge: 2022-05-30 | Disposition: A | Discharge status: Home / Self Care | Payer: Medicare Other | Source: Ambulatory Visit | Attending: Neurological Surgery | Admitting: Neurological Surgery

## 2022-05-30 DIAGNOSIS — M5416 Radiculopathy, lumbar region: Secondary | ICD-10-CM | POA: Diagnosis not present

## 2022-06-11 DIAGNOSIS — M7061 Trochanteric bursitis, right hip: Secondary | ICD-10-CM | POA: Diagnosis not present

## 2022-06-12 ENCOUNTER — Ambulatory Visit (INDEPENDENT_AMBULATORY_CARE_PROVIDER_SITE_OTHER): Payer: Medicare Other | Admitting: Obstetrics and Gynecology

## 2022-06-12 ENCOUNTER — Encounter: Payer: Self-pay | Admitting: Obstetrics and Gynecology

## 2022-06-12 VITALS — BP 118/82 | HR 81 | Ht 64.0 in | Wt 184.0 lb

## 2022-06-12 DIAGNOSIS — Z01419 Encounter for gynecological examination (general) (routine) without abnormal findings: Secondary | ICD-10-CM

## 2022-06-12 DIAGNOSIS — L821 Other seborrheic keratosis: Secondary | ICD-10-CM | POA: Diagnosis not present

## 2022-06-12 DIAGNOSIS — L723 Sebaceous cyst: Secondary | ICD-10-CM | POA: Diagnosis not present

## 2022-06-12 NOTE — Patient Instructions (Addendum)
EXERCISE AND DIET:  We recommended that you start or continue a regular exercise program for good health. Regular exercise means any activity that makes your heart beat faster and makes you sweat.  We recommend exercising at least 30 minutes per day at least 3 days a week, preferably 4 or 5.  We also recommend a diet low in fat and sugar.  Inactivity, poor dietary choices and obesity can cause diabetes, heart attack, stroke, and kidney damage, among others.    ALCOHOL AND SMOKING:  Women should limit their alcohol intake to no more than 7 drinks/beers/glasses of wine (combined, not each!) per week. Moderation of alcohol intake to this level decreases your risk of breast cancer and liver damage. And of course, no recreational drugs are part of a healthy lifestyle.  And absolutely no smoking or even second hand smoke. Most people know smoking can cause heart and lung diseases, but did you know it also contributes to weakening of your bones? Aging of your skin?  Yellowing of your teeth and nails?  CALCIUM AND VITAMIN D:  Adequate intake of calcium and Vitamin D are recommended.  The recommendations for exact amounts of these supplements seem to change often, but generally speaking 600 mg of calcium (either carbonate or citrate) and 800 units of Vitamin D per day seems prudent. Certain women may benefit from higher intake of Vitamin D.  If you are among these women, your doctor will have told you during your visit.    PAP SMEARS:  Pap smears, to check for cervical cancer or precancers,  have traditionally been done yearly, although recent scientific advances have shown that most women can have pap smears less often.  However, every woman still should have a physical exam from her gynecologist every year. It will include a breast check, inspection of the vulva and vagina to check for abnormal growths or skin changes, a visual exam of the cervix, and then an exam to evaluate the size and shape of the uterus and  ovaries.  And after 73 years of age, a rectal exam is indicated to check for rectal cancers. We will also provide age appropriate advice regarding health maintenance, like when you should have certain vaccines, screening for sexually transmitted diseases, bone density testing, colonoscopy, mammograms, etc.   MAMMOGRAMS:  All women over 73 years old should have a yearly mammogram. Many facilities now offer a "3D" mammogram, which may cost around $50 extra out of pocket. If possible,  we recommend you accept the option to have the 3D mammogram performed.  It both reduces the number of women who will be called back for extra views which then turn out to be normal, and it is better than the routine mammogram at detecting truly abnormal areas.    COLONOSCOPY:  Colonoscopy to screen for colon cancer is recommended for all women at age 53.  We know, you hate the idea of the prep.  We agree, BUT, having colon cancer and not knowing it is worse!!  Colon cancer so often starts as a polyp that can be seen and removed at colonscopy, which can quite literally save your life!  And if your first colonoscopy is normal and you have no family history of colon cancer, most women don't have to have it again for 10 years.  Once every ten years, you can do something that may end up saving your life, right?  We will be happy to help you get it scheduled when you are ready.  Be sure to check your insurance coverage so you understand how much it will cost.  It may be covered as a preventative service at no cost, but you should check your particular policy.    Calcium Content in Foods Calcium is the most abundant mineral in the body. Most of the body's calcium supply is stored in bones and teeth. Calcium helps many parts of the body function normally, including: Blood and blood vessels. Nerves. Hormones. Muscles. Bones and teeth. When your calcium stores are low, you may be at risk for low bone mass, bone loss, and broken bones  (fractures). When you get enough calcium, it helps to support strong bones and teeth throughout your life. Calcium is especially important for: Children during growth spurts. Girls during adolescence. Women who are pregnant or breastfeeding. Women after their menstrual cycle stops (postmenopause). Women whose menstrual cycle has stopped due to anorexia nervosa or regular intense exercise. People who cannot eat or digest dairy products. Vegans. Recommended daily amounts of calcium: Women (ages 40 to 64): 1,000 mg per day. Women (ages 42 and older): 1,200 mg per day. Men (ages 61 to 42): 1,000 mg per day. Men (ages 71 and older): 1,200 mg per day. Women (ages 65 to 43): 1,300 mg per day. Men (ages 34 to 69): 1,300 mg per day. General information Eat foods that are high in calcium. Try to get most of your calcium from food. Some people may benefit from taking calcium supplements. Check with your health care provider or diet and nutrition specialist (dietitian) before starting any calcium supplements. Calcium supplements may interact with certain medicines. Too much calcium may cause other health problems, such as constipation and kidney stones. For the body to absorb calcium, it needs vitamin D. Sources of vitamin D include: Skin exposure to direct sunlight. Foods, such as egg yolks, liver, mushrooms, saltwater fish, and fortified milk. Vitamin D supplements. Check with your health care provider or dietitian before starting any vitamin D supplements. What foods are high in calcium?  Foods that are high in calcium contain more than 100 milligrams per serving. Fruits Fortified orange juice or other fruit juice, 300 mg per 8 oz serving. Vegetables Collard greens, 360 mg per 8 oz serving. Kale, 100 mg per 8 oz serving. Bok choy, 160 mg per 8 oz serving. Grains Fortified ready-to-eat cereals, 100 to 1,000 mg per 8 oz serving. Fortified frozen waffles, 200 mg in 2 waffles. Oatmeal, 140 mg in  1 cup. Meats and other proteins Sardines, canned with bones, 325 mg per 3 oz serving. Salmon, canned with bones, 180 mg per 3 oz serving. Canned shrimp, 125 mg per 3 oz serving. Baked beans, 160 mg per 4 oz serving. Tofu, firm, made with calcium sulfate, 253 mg per 4 oz serving. Dairy Yogurt, plain, low-fat, 310 mg per 6 oz serving. Nonfat milk, 300 mg per 8 oz serving. American cheese, 195 mg per 1 oz serving. Cheddar cheese, 205 mg per 1 oz serving. Cottage cheese 2%, 105 mg per 4 oz serving. Fortified soy, rice, or almond milk, 300 mg per 8 oz serving. Mozzarella, part skim, 210 mg per 1 oz serving. The items listed above may not be a complete list of foods high in calcium. Actual amounts of calcium may be different depending on processing. Contact a dietitian for more information. What foods are lower in calcium? Foods that are lower in calcium contain 50 mg or less per serving. Fruits Apple, about 6 mg. Banana, about 12 mg.  Vegetables Lettuce, 19 mg per 2 oz serving. Tomato, about 11 mg. Grains Rice, 4 mg per 6 oz serving. Boiled potatoes, 14 mg per 8 oz serving. White bread, 6 mg per slice. Meats and other proteins Egg, 27 mg per 2 oz serving. Red meat, 7 mg per 4 oz serving. Chicken, 17 mg per 4 oz serving. Fish, cod, or trout, 20 mg per 4 oz serving. Dairy Cream cheese, regular, 14 mg per 1 Tbsp serving. Brie cheese, 50 mg per 1 oz serving. Parmesan cheese, 70 mg per 1 Tbsp serving. The items listed above may not be a complete list of foods lower in calcium. Actual amounts of calcium may be different depending on processing. Contact a dietitian for more information. Summary Calcium is an important mineral in the body because it affects many functions. Getting enough calcium helps support strong bones and teeth throughout your life. Try to get most of your calcium from food. Calcium supplements may interact with certain medicines. Check with your health care provider  or dietitian before starting any calcium supplements. This information is not intended to replace advice given to you by your health care provider. Make sure you discuss any questions you have with your health care provider. Document Revised: 04/22/2019 Document Reviewed: 04/22/2019 Elsevier Patient Education  2024 [employer].  Epidermoid Cyst  An epidermoid cyst, also called an epidermal cyst, is a small lump under your skin. The cyst contains a substance called keratin. Do not try to pop or open the cyst yourself. What are the causes? A blocked hair follicle. A hair that curls and re-enters the skin instead of growing straight out of the skin. A blocked pore. Irritated skin. An injury to the skin. Certain conditions that are passed along from parent to child. Human papillomavirus (HPV). This happens rarely when cysts occur on the bottom of the feet. Long-term sun damage to the skin. What increases the risk? Having acne. Being female. Having an injury to the skin. Being past puberty. Having certain conditions caused by genes (genetic disorder) What are the signs or symptoms? These cysts are usually harmless, but they can get infected. Symptoms of infection may include: Redness. Inflammation. Tenderness. Warmth. Fever. A bad-smelling substance that drains from the cyst. Pus that drains from the cyst. How is this treated? In many cases, epidermoid cysts go away on their own without treatment. If a cyst becomes infected, treatment may include: Opening and draining the cyst, done by a doctor. After draining, you may need minor surgery to remove the rest of the cyst. Antibiotic medicine. Shots of medicines (steroids) that help to reduce inflammation. Surgery to remove the cyst. Surgery may be done if the cyst: Becomes large. Bothers you. Has a chance of turning into cancer. Do not try to open a cyst yourself. Follow these instructions at home: Medicines Take over-the-counter  and prescription medicines as told by your doctor. If you were prescribed an antibiotic medicine, take it as told by your doctor. Do not stop taking it even if you start to feel better. General instructions Keep the area around your cyst clean and dry. Wear loose, dry clothing. Avoid touching your cyst. Check your cyst every day for signs of infection. Check for: Redness, swelling, or pain. Fluid or blood. Warmth. Pus or a bad smell. Keep all follow-up visits. How is this prevented? Wear clean, dry, clothing. Avoid wearing tight clothing. Keep your skin clean and dry. Take showers or baths every day. Contact a doctor if: Your cyst  has symptoms of infection. Your condition does not improve or gets worse. You have a cyst that looks different from other cysts you have had. You have a fever. Get help right away if: Redness spreads from the cyst into the area close by. Summary An epidermoid cyst is a small lump under your skin. If a cyst becomes infected, treatment may include surgery to open and drain the cyst, or to remove it. Take over-the-counter and prescription medicines only as told by your doctor. Contact a doctor if your condition is not improving or is getting worse. Keep all follow-up visits. This information is not intended to replace advice given to you by your health care provider. Make sure you discuss any questions you have with your health care provider. Document Revised: 04/01/2019 Document Reviewed: 04/01/2019 Elsevier Patient Education  2024 [employer].

## 2022-06-26 DIAGNOSIS — Z0000 Encounter for general adult medical examination without abnormal findings: Secondary | ICD-10-CM | POA: Diagnosis not present

## 2022-06-26 DIAGNOSIS — M25769 Osteophyte, unspecified knee: Secondary | ICD-10-CM | POA: Diagnosis not present

## 2022-06-26 DIAGNOSIS — M150 Primary generalized (osteo)arthritis: Secondary | ICD-10-CM | POA: Diagnosis not present

## 2022-06-26 DIAGNOSIS — F411 Generalized anxiety disorder: Secondary | ICD-10-CM | POA: Diagnosis not present

## 2022-06-26 DIAGNOSIS — Z1331 Encounter for screening for depression: Secondary | ICD-10-CM | POA: Diagnosis not present

## 2022-06-26 DIAGNOSIS — Z6831 Body mass index (BMI) 31.0-31.9, adult: Secondary | ICD-10-CM | POA: Diagnosis not present

## 2022-06-26 DIAGNOSIS — E7849 Other hyperlipidemia: Secondary | ICD-10-CM | POA: Diagnosis not present

## 2022-07-25 DIAGNOSIS — L218 Other seborrheic dermatitis: Secondary | ICD-10-CM | POA: Diagnosis not present

## 2022-07-25 DIAGNOSIS — L821 Other seborrheic keratosis: Secondary | ICD-10-CM | POA: Diagnosis not present

## 2022-07-30 DIAGNOSIS — M25561 Pain in right knee: Secondary | ICD-10-CM | POA: Diagnosis not present

## 2022-07-30 DIAGNOSIS — G8929 Other chronic pain: Secondary | ICD-10-CM | POA: Diagnosis not present

## 2022-07-30 DIAGNOSIS — Z6832 Body mass index (BMI) 32.0-32.9, adult: Secondary | ICD-10-CM | POA: Diagnosis not present

## 2022-07-30 DIAGNOSIS — M5136 Other intervertebral disc degeneration, lumbar region: Secondary | ICD-10-CM | POA: Diagnosis not present

## 2022-08-01 DIAGNOSIS — Z780 Asymptomatic menopausal state: Secondary | ICD-10-CM | POA: Diagnosis not present

## 2022-08-01 DIAGNOSIS — M810 Age-related osteoporosis without current pathological fracture: Secondary | ICD-10-CM | POA: Diagnosis not present

## 2022-08-07 ENCOUNTER — Telehealth: Payer: Self-pay | Admitting: *Deleted

## 2022-08-07 DIAGNOSIS — M170 Bilateral primary osteoarthritis of knee: Secondary | ICD-10-CM | POA: Diagnosis not present

## 2022-08-07 NOTE — Telephone Encounter (Signed)
1st attempt at calling pt to schedule tele for preop. lvmtrc

## 2022-08-07 NOTE — Telephone Encounter (Signed)
   Name: Grace Thomas  DOB: 29-Mar-1950  MRN: [id number]  Primary Cardiologist: Nona Dell, MD   Preoperative team, please contact this patient and set up a phone call appointment for further preoperative risk assessment. Please obtain consent and complete medication review. Thank you for your help.  I confirm that guidance regarding antiplatelet and oral anticoagulation therapy has been completed and, if necessary, noted below.  Regarding ASA therapy it may be stopped 5-7 days prior to surgery with a plan to resume it as soon as felt to be feasible from a surgical standpoint in the post-operative period.    Napoleon Form, Leodis Rains, NP 08/07/2022, 4:00 PM [facility]

## 2022-08-07 NOTE — Telephone Encounter (Signed)
   Pre-operative Risk Assessment    Patient Name: Grace Thomas  DOB: 12/04/1950 MRN: [id number]      Request for Surgical Clearance    Procedure:   RIGHT TOTAL KNEE REPLACEMENT   Date of Surgery:  Clearance TBD                                 Surgeon:  DR. DANIEL MARCHWIANY  Surgeon's Group or Practice Name:  [facility] Phone number:  [phone] EXT [phone] ATTN: KELLY HIGH Fax number:  [phone]   Type of Clearance Requested:   - Medical ; ASA    Type of Anesthesia:  Spinal   Additional requests/questions:    Elpidio Anis   08/07/2022, 3:40 PM

## 2022-08-08 ENCOUNTER — Telehealth: Payer: Self-pay | Admitting: *Deleted

## 2022-08-08 NOTE — Telephone Encounter (Signed)
Patient is returning call. Requesting call back 

## 2022-08-08 NOTE — Telephone Encounter (Signed)
I s/w the pt and she has been scheduled for tele pre op app 09/14/22 @ 9:20. Med rec and consent are done. Pt said her surgery is being planned for 6 weeks out.      Patient Consent for Virtual Visit        Grace Thomas has provided verbal consent on 08/08/2022 for a virtual visit (video or telephone).   CONSENT FOR VIRTUAL VISIT FOR:  Grace Thomas  By participating in this virtual visit I agree to the following:  I hereby voluntarily request, consent and authorize [facility] and its employed or contracted physicians, physician assistants, nurse practitioners or other licensed health care professionals (the Practitioner), to provide me with telemedicine health care services (the "Services") as deemed necessary by the treating Practitioner. I acknowledge and consent to receive the Services by the Practitioner via telemedicine. I understand that the telemedicine visit will involve communicating with the Practitioner through live audiovisual communication technology and the disclosure of certain medical information by electronic transmission. I acknowledge that I have been given the opportunity to request an in-person assessment or other available alternative prior to the telemedicine visit and am voluntarily participating in the telemedicine visit.  I understand that I have the right to withhold or withdraw my consent to the use of telemedicine in the course of my care at any time, without affecting my right to future care or treatment, and that the Practitioner or I may terminate the telemedicine visit at any time. I understand that I have the right to inspect all information obtained and/or recorded in the course of the telemedicine visit and may receive copies of available information for a reasonable fee.  I understand that some of the potential risks of receiving the Services via telemedicine include:  Delay or interruption in medical evaluation due to technological equipment  failure or disruption; Information transmitted may not be sufficient (e.g. poor resolution of images) to allow for appropriate medical decision making by the Practitioner; and/or  In rare instances, security protocols could fail, causing a breach of personal health information.  Furthermore, I acknowledge that it is my responsibility to provide information about my medical history, conditions and care that is complete and accurate to the best of my ability. I acknowledge that Practitioner's advice, recommendations, and/or decision may be based on factors not within their control, such as incomplete or inaccurate data provided by me or distortions of diagnostic images or specimens that may result from electronic transmissions. I understand that the practice of medicine is not an exact science and that Practitioner makes no warranties or guarantees regarding treatment outcomes. I acknowledge that a copy of this consent can be made available to me via my patient portal [facility] MyChart), or I can request a printed copy by calling the office of [facility].    I understand that my insurance will be billed for this visit.   I have read or had this consent read to me. I understand the contents of this consent, which adequately explains the benefits and risks of the Services being provided via telemedicine.  I have been provided ample opportunity to ask questions regarding this consent and the Services and have had my questions answered to my satisfaction. I give my informed consent for the services to be provided through the use of telemedicine in my medical care

## 2022-08-08 NOTE — Telephone Encounter (Signed)
I s/w the pt and she has been scheduled for tele pre op app 09/14/22 @ 9:20. Med rec and consent are done. Pt said her surgery is being planned for 6 weeks out.

## 2022-08-09 DIAGNOSIS — M25561 Pain in right knee: Secondary | ICD-10-CM | POA: Diagnosis not present

## 2022-08-14 DIAGNOSIS — H6123 Impacted cerumen, bilateral: Secondary | ICD-10-CM | POA: Diagnosis not present

## 2022-08-20 DIAGNOSIS — M25561 Pain in right knee: Secondary | ICD-10-CM | POA: Diagnosis not present

## 2022-08-22 DIAGNOSIS — M25561 Pain in right knee: Secondary | ICD-10-CM | POA: Diagnosis not present

## 2022-08-28 DIAGNOSIS — M25561 Pain in right knee: Secondary | ICD-10-CM | POA: Diagnosis not present

## 2022-09-11 DIAGNOSIS — M25561 Pain in right knee: Secondary | ICD-10-CM | POA: Diagnosis not present

## 2022-09-12 NOTE — Progress Notes (Signed)
Virtual Visit via Telephone Note   Because of Grace Thomas's co-morbid illnesses, she is at least at moderate risk for complications without adequate follow up.  This format is felt to be most appropriate for this patient at this time.  The patient did not have access to video technology/had technical difficulties with video requiring transitioning to audio format only (telephone).  All issues noted in this document were discussed and addressed.  No physical exam could be performed with this format.  Please refer to the patient's chart for her consent to telehealth for [facility].  Evaluation Performed:  Preoperative cardiovascular risk assessment _____________   Date:  09/14/2022   Patient ID:  Grace Thomas, DOB 1950-11-07, MRN [id number] Patient Location:  Home Provider location:   Office  Primary Care Provider:  Toma Deiters, MD Primary Cardiologist:  Nona Dell, MD  Chief Complaint / Patient Profile   72 y.o. y/o female with a h/o severe aortic valve stenosis status post TAVR with 23 mm Edwards S3 you in October 2022 on aspirin,  She is pending right total knee replacement by Dr. Harri[facility] scheduled for date to be determined  and  presents today for telephonic preoperative cardiovascular risk assessment.  History of Present Illness    Grace Thomas is a 72 y.o. female who presents via audio/video conferencing for a telehealth visit today.  Pt was last seen in cardiology clinic on 04/29/2022 by Dr. Diona Browner.  At that time Grace Thomas was doing well.   The patient is now pending procedure as outlined above. Since her last visit, she   Past Medical History    Past Medical History:  Diagnosis Date   Anxiety    Aortic stenosis 11/01/2020   TAVR   Arthritis    Asthma due to seasonal allergies    Pt.denies 12/18/21   Back pain    COVID-26 July 2020   GERD (gastroesophageal reflux disease)    " a little"   Heart murmur     Knee pain    had gel shots for bilateral 12/05/21 was last injection   Recurrent UTI    S/P TAVR (transcatheter aortic valve replacement) 11/01/2020   Edwards 23mm S3U TF approach with Dr. Edison Pace   Past Surgical History:  Procedure Laterality Date   ABDOMINAL HYSTERECTOMY  1994   TAH BSO   ANTERIOR CERVICAL DECOMP/DISCECTOMY FUSION N/A 02/28/2021   Procedure: Cervical Four-Five Cervical Five-Six Anterior Cervical Decompression Fusion;  Surgeon: Bethann Goo, DO;  Location: [facility] OR;  Service: Neurosurgery;  Laterality: N/A;  3C/RM 19   CARDIAC CATHETERIZATION     CARPAL TUNNEL RELEASE Bilateral    COLONOSCOPY     REVERSE SHOULDER ARTHROPLASTY Right 06/22/2021   Procedure: REVERSE SHOULDER ARTHROPLASTY;  Surgeon: Bjorn Pippin, MD;  Location: [facility];  Service: Orthopedics;  Laterality: Right;   RIGHT/LEFT HEART CATH AND CORONARY ANGIOGRAPHY N/A 09/05/2020   Procedure: RIGHT/LEFT HEART CATH AND CORONARY ANGIOGRAPHY;  Surgeon: Tonny Bollman, MD;  Location: [facility] INVASIVE CV LAB;  Service: Cardiovascular;  Laterality: N/A;   ROTATOR CUFF REPAIR Right    TEE WITHOUT CARDIOVERSION N/A 11/01/2020   Procedure: TRANSESOPHAGEAL ECHOCARDIOGRAM (TEE);  Surgeon: Tonny Bollman, MD;  Location: [facility] OR;  Service: Open Heart Surgery;  Laterality: N/A;   TRANSCATHETER AORTIC VALVE REPLACEMENT, TRANSFEMORAL N/A 11/01/2020   Procedure: TRANSCATHETER AORTIC VALVE REPLACEMENT, TRANSFEMORAL;  Surgeon: Tonny Bollman, MD;  Location: [facility] OR;  Service: Open Heart Surgery;  Laterality: N/A;   TUBAL LIGATION      Allergies  No Known Allergies  Home Medications    Prior to Admission medications   Medication Sig Start Date End Date Taking? Authorizing Provider  acetaminophen (TYLENOL) 500 MG tablet Take 500 mg by mouth every 4 (four) hours as needed for mild pain.    [provider]  ALPRAZolam Prudy Feeler) 0.25 MG tablet Take 0.125 mg by mouth daily as needed for anxiety  (anxiety/nerves).    [provider]  amoxicillin (AMOXIL) 500 MG capsule SMARTSIG:4 Capsule(s) By Mouth 05/22/22   [provider]  Ascorbic Acid (VITAMIN C PO) Take 2 tablets by mouth every evening.    [provider]  aspirin EC 81 MG tablet Take 81 mg by mouth daily. Swallow whole.    [provider]  celecoxib (CELEBREX) 100 MG capsule Take 100 mg by mouth 2 (two) times daily. Uses prn only    [provider]  Cholecalciferol (VITAMIN D3) 50 MCG (2000 UT) TABS Take 2,000 Units by mouth every evening.    [provider]  Cyanocobalamin (VITAMIN B-12 PO) Take 5,000 mcg by mouth.    [provider]  fexofenadine (ALLEGRA) 180 MG tablet Take 180 mg by mouth daily as needed for allergies or rhinitis.    [provider]  HYDROcodone-acetaminophen (NORCO/VICODIN) 5-325 MG tablet Take by mouth. 04/30/22   [provider]  miconazole (MONISTAT 7) 2 % vaginal cream Place 1 Applicatorful vaginally at bedtime as needed (yeast infection).    [provider]  Misc Natural Products (YEAST BALANCE ICS) CAPS Take 1 capsule by mouth every evening. AZO    [provider]  triamcinolone (NASACORT) 55 MCG/ACT AERO nasal inhaler Place 2 sprays into the nose daily as needed (allergies.).    [provider]  Zinc 50 MG TABS Take 50 mg by mouth every evening.    [provider]    Physical Exam    Vital Signs:  Grace Thomas does not have vital signs available for review today.  Given telephonic nature of communication, physical exam is limited. AAOx3. NAD. Normal affect.  Speech and respirations are unlabored.  Accessory Clinical Findings    None  Assessment & Plan    1.  Preoperative Cardiovascular Risk Assessment: According to the Revised Cardiac Risk Index (RCRI), her Perioperative Risk of Major Cardiac Event is (%): 0.4  Her Functional Capacity in METs is: 7.99 according to the [facility]  Activity Status Index (DASI).    Per office protocol, if patient is without any new symptoms or concerns at the time of their virtual visit, he/she may hold ASA for 7 days prior to procedure. Please resume ASA as soon as possible postprocedure, at the discretion of the surgeon.    The patient was advised that if she develops new symptoms prior to surgery to contact our office to arrange for a follow-up visit, and she verbalized understanding.  A copy of this note will be routed to requesting surgeon.  Time:   Today, I have spent 10 minutes with the patient with telehealth technology discussing medical history, symptoms, and management plan.     Joni Reining, NP   09/14/2022, 9:27 AM

## 2022-09-13 DIAGNOSIS — M25561 Pain in right knee: Secondary | ICD-10-CM | POA: Diagnosis not present

## 2022-09-14 ENCOUNTER — Ambulatory Visit: Payer: Medicare Other | Attending: Cardiology

## 2022-09-14 DIAGNOSIS — Z01810 Encounter for preprocedural cardiovascular examination: Secondary | ICD-10-CM

## 2022-09-14 DIAGNOSIS — Z01818 Encounter for other preprocedural examination: Secondary | ICD-10-CM

## 2022-09-17 DIAGNOSIS — M25561 Pain in right knee: Secondary | ICD-10-CM | POA: Diagnosis not present

## 2022-09-19 DIAGNOSIS — M25561 Pain in right knee: Secondary | ICD-10-CM | POA: Diagnosis not present

## 2022-09-24 DIAGNOSIS — M25561 Pain in right knee: Secondary | ICD-10-CM | POA: Diagnosis not present

## 2022-09-25 DIAGNOSIS — Z6831 Body mass index (BMI) 31.0-31.9, adult: Secondary | ICD-10-CM | POA: Diagnosis not present

## 2022-09-25 DIAGNOSIS — K648 Other hemorrhoids: Secondary | ICD-10-CM | POA: Diagnosis not present

## 2022-09-25 DIAGNOSIS — I10 Essential (primary) hypertension: Secondary | ICD-10-CM | POA: Diagnosis not present

## 2022-09-27 DIAGNOSIS — M1711 Unilateral primary osteoarthritis, right knee: Secondary | ICD-10-CM | POA: Diagnosis not present

## 2022-09-28 ENCOUNTER — Ambulatory Visit: Payer: Self-pay | Admitting: Emergency Medicine

## 2022-09-28 DIAGNOSIS — G8929 Other chronic pain: Secondary | ICD-10-CM

## 2022-09-28 NOTE — H&P (View-Only) (Signed)
TOTAL KNEE ADMISSION H&P  Patient is being admitted for right total knee arthroplasty.  Subjective:  Chief Complaint:right knee pain.  HPI: Grace Thomas, 72 y.o. female, has a history of pain and functional disability in the right knee due to arthritis and has failed non-surgical conservative treatments for greater than 12 weeks to includeNSAID's and/or analgesics, corticosteriod injections, viscosupplementation injections, supervised PT with diminished ADL's post treatment, use of assistive devices, and activity modification.  Onset of symptoms was gradual, starting 1 years ago with gradually worsening course since that time. The patient noted no past surgery on the right knee(s).  Patient currently rates pain in the right knee(s) at 10 out of 10 with activity. Patient has night pain, worsening of pain with activity and weight bearing, pain that interferes with activities of daily living, and pain with passive range of motion.  Patient has evidence of periarticular osteophytes and joint space narrowing by imaging studies.  There is no active infection.  Patient Active Problem List   Diagnosis Date Noted   Cervical radiculopathy at C6 02/28/2021   Anxiety 11/01/2020   GERD (gastroesophageal reflux disease) 11/01/2020   Severe aortic stenosis 11/01/2020   S/P TAVR (transcatheter aortic valve replacement) 11/01/2020   Past Medical History:  Diagnosis Date   Anxiety    Aortic stenosis 11/01/2020   TAVR   Arthritis    Asthma due to seasonal allergies    Pt.denies 12/18/21   Back pain    COVID-26 July 2020   GERD (gastroesophageal reflux disease)    " a little"   Heart murmur    Knee pain    had gel shots for bilateral 12/05/21 was last injection   Recurrent UTI    S/P TAVR (transcatheter aortic valve replacement) 11/01/2020   Edwards 23mm S3U TF approach with Dr. Edison Pace    Past Surgical History:  Procedure Laterality Date   ABDOMINAL HYSTERECTOMY  1994   TAH BSO    ANTERIOR CERVICAL DECOMP/DISCECTOMY FUSION N/A 02/28/2021   Procedure: Cervical Four-Five Cervical Five-Six Anterior Cervical Decompression Fusion;  Surgeon: Bethann Goo, DO;  Location: [facility] OR;  Service: Neurosurgery;  Laterality: N/A;  3C/RM 19   CARDIAC CATHETERIZATION     CARPAL TUNNEL RELEASE Bilateral    COLONOSCOPY     REVERSE SHOULDER ARTHROPLASTY Right 06/22/2021   Procedure: REVERSE SHOULDER ARTHROPLASTY;  Surgeon: Bjorn Pippin, MD;  Location: [facility];  Service: Orthopedics;  Laterality: Right;   RIGHT/LEFT HEART CATH AND CORONARY ANGIOGRAPHY N/A 09/05/2020   Procedure: RIGHT/LEFT HEART CATH AND CORONARY ANGIOGRAPHY;  Surgeon: Tonny Bollman, MD;  Location: [facility] INVASIVE CV LAB;  Service: Cardiovascular;  Laterality: N/A;   ROTATOR CUFF REPAIR Right    TEE WITHOUT CARDIOVERSION N/A 11/01/2020   Procedure: TRANSESOPHAGEAL ECHOCARDIOGRAM (TEE);  Surgeon: Tonny Bollman, MD;  Location: [facility] OR;  Service: Open Heart Surgery;  Laterality: N/A;   TRANSCATHETER AORTIC VALVE REPLACEMENT, TRANSFEMORAL N/A 11/01/2020   Procedure: TRANSCATHETER AORTIC VALVE REPLACEMENT, TRANSFEMORAL;  Surgeon: Tonny Bollman, MD;  Location: [facility] OR;  Service: Open Heart Surgery;  Laterality: N/A;   TUBAL LIGATION      Current Outpatient Medications  Medication Sig Dispense Refill Last Dose   acetaminophen (TYLENOL) 500 MG tablet Take 500-1,000 mg by mouth every 4 (four) hours as needed for mild pain.      ALPRAZolam (XANAX) 0.25 MG tablet Take 0.125 mg by mouth daily as needed for anxiety (anxiety/nerves).      amoxicillin (AMOXIL)  500 MG capsule Take 2,000 mg by mouth See admin instructions. Take 2000 mg 1 hour prior to dental work      aspirin EC 81 MG tablet Take 81 mg by mouth daily. Swallow whole.      celecoxib (CELEBREX) 100 MG capsule Take 100 mg by mouth 2 (two) times daily as needed for moderate pain.      Cholecalciferol (VITAMIN D3) 50 MCG (2000 UT) TABS Take 2,000 Units by mouth  every evening.      fexofenadine (ALLEGRA) 180 MG tablet Take 180 mg by mouth daily as needed for allergies or rhinitis.      fluconazole (DIFLUCAN) 150 MG tablet Take 150 mg by mouth daily as needed (yeast).      miconazole (MONISTAT 7) 2 % vaginal cream Place 1 Applicatorful vaginally at bedtime as needed (yeast infection).      triamcinolone (NASACORT) 55 MCG/ACT AERO nasal inhaler Place 2 sprays into the nose daily as needed (allergies.).      vitamin B-12 (CYANOCOBALAMIN) 500 MCG tablet Take 500 mcg by mouth daily.      Zinc 50 MG TABS Take 50 mg by mouth every evening.      No current facility-administered medications for this visit.   No Known Allergies  Social History   Tobacco Use   Smoking status: Never    Passive exposure: Past (husband smoked in past)   Smokeless tobacco: Never  Substance Use Topics   Alcohol use: No    Family History  Problem Relation Age of Onset   Heart failure Mother    Stroke Father    Bone cancer Sister    Pancreatic cancer Sister    Colitis Daughter    Colon cancer Neg Hx    Colon polyps Neg Hx    Crohn's disease Neg Hx    Esophageal cancer Neg Hx    Rectal cancer Neg Hx    Stomach cancer Neg Hx      Review of Systems  Musculoskeletal:  Positive for arthralgias.  All other systems reviewed and are negative.   Objective:  Physical Exam Constitutional:      General: She is not in acute distress.    Appearance: Normal appearance. She is not ill-appearing.  HENT:     Head: Normocephalic and atraumatic.     Right Ear: External ear normal.     Left Ear: External ear normal.     Nose: Nose normal.     Mouth/Throat:     Mouth: Mucous membranes are moist.     Pharynx: Oropharynx is clear.  Eyes:     Extraocular Movements: Extraocular movements intact.     Conjunctiva/sclera: Conjunctivae normal.  Cardiovascular:     Rate and Rhythm: Normal rate and regular rhythm.     Pulses: Normal pulses.     Heart sounds: Murmur (at baseline)  heard.  Pulmonary:     Effort: Pulmonary effort is normal.     Breath sounds: Normal breath sounds.  Abdominal:     General: Bowel sounds are normal.     Palpations: Abdomen is soft.     Tenderness: There is no abdominal tenderness.  Musculoskeletal:        General: Tenderness present.     Cervical back: Normal range of motion and neck supple.     Comments: TTP over bilateral joint lines, medial worse than lateral.  No calf tenderness, swelling, or erythema.  No overlying lesions of area of chief complaint.  Decreased strength and  ROM due to elicited pain.  Pre-operative ROM 0-120.  Dorsiflexion and plantarflexion intact.  Stable to varus and valgus stress.  BLE appear grossly neurovascularly intact.  Gait mildly antalgic.   Skin:    General: Skin is warm and dry.  Neurological:     Mental Status: She is alert and oriented to person, place, and time. Mental status is at baseline.  Psychiatric:        Mood and Affect: Mood normal.        Behavior: Behavior normal.     Vital signs in last 24 hours: @VSRANGES @  Labs:   Estimated body mass index is 31.58 kg/m as calculated from the following:   Height as of 06/12/22: 5\' 4"  (1.626 m).   Weight as of 06/12/22: 83.5 kg.   Imaging Review Plain radiographs demonstrate severe degenerative joint disease of the right knee(s). The overall alignment ismild valgus. The bone quality appears to be fair for age and reported activity level.      Assessment/Plan:  End stage arthritis, right knee   The patient history, physical examination, clinical judgment of the provider and imaging studies are consistent with end stage degenerative joint disease of the right knee(s) and total knee arthroplasty is deemed medically necessary. The treatment options including medical management, injection therapy arthroscopy and arthroplasty were discussed at length. The risks and benefits of total knee arthroplasty were presented and reviewed. The risks due to  aseptic loosening, infection, stiffness, patella tracking problems, thromboembolic complications and other imponderables were discussed. The patient acknowledged the explanation, agreed to proceed with the plan and consent was signed. Patient is being admitted for inpatient treatment for surgery, pain control, PT, OT, prophylactic antibiotics, VTE prophylaxis, progressive ambulation and ADL's and discharge planning. The patient is planning to be discharged home with outpatient PT.    Anticipated LOS equal to or greater than 2 midnights due to - Age 74 and older with one or more of the following:  - Obesity  - Expected need for hospital services (PT, OT, Nursing) required for safe  discharge  - Anticipated need for postoperative skilled nursing care or inpatient rehab  - Active co-morbidities: Severe aortic stenosis S/P TAVR, recurrent UTIs, prior heart catheterization, GERD OR   - Unanticipated findings during/Post Surgery: None  - Patient is a high risk of re-admission due to: None

## 2022-09-28 NOTE — H&P (Signed)
TOTAL KNEE ADMISSION H&P  Patient is being admitted for right total knee arthroplasty.  Subjective:  Chief Complaint:right knee pain.  HPI: Grace Thomas, 72 y.o. female, has a history of pain and functional disability in the right knee due to arthritis and has failed non-surgical conservative treatments for greater than 12 weeks to includeNSAID's and/or analgesics, corticosteriod injections, viscosupplementation injections, supervised PT with diminished ADL's post treatment, use of assistive devices, and activity modification.  Onset of symptoms was gradual, starting 1 years ago with gradually worsening course since that time. The patient noted no past surgery on the right knee(s).  Patient currently rates pain in the right knee(s) at 10 out of 10 with activity. Patient has night pain, worsening of pain with activity and weight bearing, pain that interferes with activities of daily living, and pain with passive range of motion.  Patient has evidence of periarticular osteophytes and joint space narrowing by imaging studies.  There is no active infection.  Patient Active Problem List   Diagnosis Date Noted   Cervical radiculopathy at C6 02/28/2021   Anxiety 11/01/2020   GERD (gastroesophageal reflux disease) 11/01/2020   Severe aortic stenosis 11/01/2020   S/P TAVR (transcatheter aortic valve replacement) 11/01/2020   Past Medical History:  Diagnosis Date   Anxiety    Aortic stenosis 11/01/2020   TAVR   Arthritis    Asthma due to seasonal allergies    Pt.denies 12/18/21   Back pain    COVID-26 July 2020   GERD (gastroesophageal reflux disease)    " a little"   Heart murmur    Knee pain    had gel shots for bilateral 12/05/21 was last injection   Recurrent UTI    S/P TAVR (transcatheter aortic valve replacement) 11/01/2020   Edwards 23mm S3U TF approach with Dr. Edison Pace    Past Surgical History:  Procedure Laterality Date   ABDOMINAL HYSTERECTOMY  1994   TAH BSO    ANTERIOR CERVICAL DECOMP/DISCECTOMY FUSION N/A 02/28/2021   Procedure: Cervical Four-Five Cervical Five-Six Anterior Cervical Decompression Fusion;  Surgeon: Bethann Goo, DO;  Location: [facility] OR;  Service: Neurosurgery;  Laterality: N/A;  3C/RM 19   CARDIAC CATHETERIZATION     CARPAL TUNNEL RELEASE Bilateral    COLONOSCOPY     REVERSE SHOULDER ARTHROPLASTY Right 06/22/2021   Procedure: REVERSE SHOULDER ARTHROPLASTY;  Surgeon: Bjorn Pippin, MD;  Location: [facility];  Service: Orthopedics;  Laterality: Right;   RIGHT/LEFT HEART CATH AND CORONARY ANGIOGRAPHY N/A 09/05/2020   Procedure: RIGHT/LEFT HEART CATH AND CORONARY ANGIOGRAPHY;  Surgeon: Tonny Bollman, MD;  Location: [facility] INVASIVE CV LAB;  Service: Cardiovascular;  Laterality: N/A;   ROTATOR CUFF REPAIR Right    TEE WITHOUT CARDIOVERSION N/A 11/01/2020   Procedure: TRANSESOPHAGEAL ECHOCARDIOGRAM (TEE);  Surgeon: Tonny Bollman, MD;  Location: [facility] OR;  Service: Open Heart Surgery;  Laterality: N/A;   TRANSCATHETER AORTIC VALVE REPLACEMENT, TRANSFEMORAL N/A 11/01/2020   Procedure: TRANSCATHETER AORTIC VALVE REPLACEMENT, TRANSFEMORAL;  Surgeon: Tonny Bollman, MD;  Location: [facility] OR;  Service: Open Heart Surgery;  Laterality: N/A;   TUBAL LIGATION      Current Outpatient Medications  Medication Sig Dispense Refill Last Dose   acetaminophen (TYLENOL) 500 MG tablet Take 500-1,000 mg by mouth every 4 (four) hours as needed for mild pain.      ALPRAZolam (XANAX) 0.25 MG tablet Take 0.125 mg by mouth daily as needed for anxiety (anxiety/nerves).      amoxicillin (AMOXIL)  500 MG capsule Take 2,000 mg by mouth See admin instructions. Take 2000 mg 1 hour prior to dental work      aspirin EC 81 MG tablet Take 81 mg by mouth daily. Swallow whole.      celecoxib (CELEBREX) 100 MG capsule Take 100 mg by mouth 2 (two) times daily as needed for moderate pain.      Cholecalciferol (VITAMIN D3) 50 MCG (2000 UT) TABS Take 2,000 Units by mouth  every evening.      fexofenadine (ALLEGRA) 180 MG tablet Take 180 mg by mouth daily as needed for allergies or rhinitis.      fluconazole (DIFLUCAN) 150 MG tablet Take 150 mg by mouth daily as needed (yeast).      miconazole (MONISTAT 7) 2 % vaginal cream Place 1 Applicatorful vaginally at bedtime as needed (yeast infection).      triamcinolone (NASACORT) 55 MCG/ACT AERO nasal inhaler Place 2 sprays into the nose daily as needed (allergies.).      vitamin B-12 (CYANOCOBALAMIN) 500 MCG tablet Take 500 mcg by mouth daily.      Zinc 50 MG TABS Take 50 mg by mouth every evening.      No current facility-administered medications for this visit.   No Known Allergies  Social History   Tobacco Use   Smoking status: Never    Passive exposure: Past (husband smoked in past)   Smokeless tobacco: Never  Substance Use Topics   Alcohol use: No    Family History  Problem Relation Age of Onset   Heart failure Mother    Stroke Father    Bone cancer Sister    Pancreatic cancer Sister    Colitis Daughter    Colon cancer Neg Hx    Colon polyps Neg Hx    Crohn's disease Neg Hx    Esophageal cancer Neg Hx    Rectal cancer Neg Hx    Stomach cancer Neg Hx      Review of Systems  Musculoskeletal:  Positive for arthralgias.  All other systems reviewed and are negative.   Objective:  Physical Exam Constitutional:      General: She is not in acute distress.    Appearance: Normal appearance. She is not ill-appearing.  HENT:     Head: Normocephalic and atraumatic.     Right Ear: External ear normal.     Left Ear: External ear normal.     Nose: Nose normal.     Mouth/Throat:     Mouth: Mucous membranes are moist.     Pharynx: Oropharynx is clear.  Eyes:     Extraocular Movements: Extraocular movements intact.     Conjunctiva/sclera: Conjunctivae normal.  Cardiovascular:     Rate and Rhythm: Normal rate and regular rhythm.     Pulses: Normal pulses.     Heart sounds: Murmur (at baseline)  heard.  Pulmonary:     Effort: Pulmonary effort is normal.     Breath sounds: Normal breath sounds.  Abdominal:     General: Bowel sounds are normal.     Palpations: Abdomen is soft.     Tenderness: There is no abdominal tenderness.  Musculoskeletal:        General: Tenderness present.     Cervical back: Normal range of motion and neck supple.     Comments: TTP over bilateral joint lines, medial worse than lateral.  No calf tenderness, swelling, or erythema.  No overlying lesions of area of chief complaint.  Decreased strength and  ROM due to elicited pain.  Pre-operative ROM 0-120.  Dorsiflexion and plantarflexion intact.  Stable to varus and valgus stress.  BLE appear grossly neurovascularly intact.  Gait mildly antalgic.   Skin:    General: Skin is warm and dry.  Neurological:     Mental Status: She is alert and oriented to person, place, and time. Mental status is at baseline.  Psychiatric:        Mood and Affect: Mood normal.        Behavior: Behavior normal.     Vital signs in last 24 hours: @VSRANGES @  Labs:   Estimated body mass index is 31.58 kg/m as calculated from the following:   Height as of 06/12/22: 5\' 4"  (1.626 m).   Weight as of 06/12/22: 83.5 kg.   Imaging Review Plain radiographs demonstrate severe degenerative joint disease of the right knee(s). The overall alignment ismild valgus. The bone quality appears to be fair for age and reported activity level.      Assessment/Plan:  End stage arthritis, right knee   The patient history, physical examination, clinical judgment of the provider and imaging studies are consistent with end stage degenerative joint disease of the right knee(s) and total knee arthroplasty is deemed medically necessary. The treatment options including medical management, injection therapy arthroscopy and arthroplasty were discussed at length. The risks and benefits of total knee arthroplasty were presented and reviewed. The risks due to  aseptic loosening, infection, stiffness, patella tracking problems, thromboembolic complications and other imponderables were discussed. The patient acknowledged the explanation, agreed to proceed with the plan and consent was signed. Patient is being admitted for inpatient treatment for surgery, pain control, PT, OT, prophylactic antibiotics, VTE prophylaxis, progressive ambulation and ADL's and discharge planning. The patient is planning to be discharged home with outpatient PT.    Anticipated LOS equal to or greater than 2 midnights due to - Age 74 and older with one or more of the following:  - Obesity  - Expected need for hospital services (PT, OT, Nursing) required for safe  discharge  - Anticipated need for postoperative skilled nursing care or inpatient rehab  - Active co-morbidities: Severe aortic stenosis S/P TAVR, recurrent UTIs, prior heart catheterization, GERD OR   - Unanticipated findings during/Post Surgery: None  - Patient is a high risk of re-admission due to: None

## 2022-09-28 NOTE — Progress Notes (Signed)
Sent message, via epic in basket, requesting orders in epic from surgeon.  

## 2022-10-04 NOTE — Progress Notes (Addendum)
COVID Vaccine received:  [x]  No []  Yes Date of any COVID positive Test in last 90 days:   None  PCP - Lia Hopping, MD Medical clearance on chart  [phone] (Work)  [phone] (Fax)  Cardiologist - Nona Dell, MD ,  Joni Reining, NP  cardiac clearance 09-14-2022  note in Epic  Chest x-ray - 10-28-2020  2v  Epic EKG - 05-02-2022  Epic  Stress Test -  ECHO - 10-25-2021  Epic Cardiac Cath - 09-05-2020  R/LHC by Dr. Excell Seltzer for preop TAVR  PCR screen: [x]  Ordered & Completed []   No Order but Needs PROFEND     []   N/A for this surgery  Surgery Plan:  []  Ambulatory   [x]  Outpatient in bed  []  Admit Anesthesia:    []  General  [x]  Spinal  []   Choice []   MAC  Pacemaker / ICD device [x]  No []  Yes   Spinal Cord Stimulator:[x]  No []  Yes       History of Sleep Apnea? [x]  No []  Yes   CPAP used?- [x]  No []  Yes    Does the patient monitor blood sugar?   [x]  N/A   []  No []  Yes  Patient has: [x]  NO Hx DM   []  Pre-DM   []  DM1  []   DM2  Blood Thinner / Instructions:  None Aspirin Instructions:  ASA 81 mg, ok to hold x 7 days per 09-14-22 note by Lyman Bishop NP  ERAS Protocol Ordered: []  No  [x]  Yes PRE-SURGERY [x]  ENSURE  []  G2    Patient is to be NPO after: 09:30  Dental hx: []  Dentures:  []  N/A      [x]  Bridge or Partial: Top partial                   []  Loose or Damaged teeth:    Comments: Patient was given the 5 CHG shower / bath instructions for  TKA surgery along with 2 bottles of the CHG soap. Patient will start this on:  Thursday  10-11-2022 All questions were asked and answered, Patient voiced understanding of this process.   Activity level: Patient is able to climb a flight of stairs without difficulty; [x]  No CP  [x]  No SOB, but would have leg pain. Patient can perform ADLs without assistance.   Anesthesia review: s/p TAVR 11-01-2020,  ACDF C4-5,C5-6 (02-28-2021), Depression / anxiety, asthma, GERD  Patient denies shortness of breath, fever, cough and chest pain at PAT  appointment.  Patient verbalized understanding and agreement to the Pre-Surgical Instructions that were given to them at this PAT appointment. Patient was also educated of the need to review these PAT instructions again prior to her surgery.I reviewed the appropriate phone numbers to call if they have any and questions or concerns.

## 2022-10-04 NOTE — Patient Instructions (Addendum)
SURGICAL WAITING ROOM VISITATION Patients having surgery or a procedure may have no more than 2 support people in the waiting area - these visitors may rotate in the visitor waiting room.   Due to an increase in RSV and influenza rates and associated hospitalizations, children ages 50 and under may not visit patients in [facility] hospitals. If the patient needs to stay at the hospital during part of their recovery, the visitor guidelines for inpatient rooms apply.  PRE-OP VISITATION  Pre-op nurse will coordinate an appropriate time for 1 support person to accompany the patient in pre-op.  This support person may not rotate.  This visitor will be contacted when the time is appropriate for the visitor to come back in the pre-op area.  Please refer to the [facility] website for the visitor guidelines for Inpatients (after your surgery is over and you are in a regular room).  You are not required to quarantine at this time prior to your surgery. However, you must do this: Hand Hygiene often Do NOT share personal items Notify your provider if you are in close contact with someone who has COVID or you develop fever 100.4 or greater, new onset of sneezing, cough, sore throat, shortness of breath or body aches.  If you test positive for Covid or have been in contact with anyone that has tested positive in the last 10 days please notify you surgeon.    Your procedure is scheduled on:  MONDAY  October 15, 2022  Report to [facility] Main Entrance: [facility] entrance where the [employer] is available.   Report to admitting at: 10:00    AM  Call this number if you have any questions or problems the morning of surgery [phone]  Do not eat food after Midnight the night prior to your surgery/procedure.  After Midnight you may have the following liquids until  09:30 AM DAY OF SURGERY  Clear Liquid Diet Water Black Coffee (sugar ok, NO MILK/CREAM OR CREAMERS)  Tea (sugar ok, NO  MILK/CREAM OR CREAMERS) regular and decaf                             Plain Jell-O  with no fruit (NO RED)                                           Fruit ices (not with fruit pulp, NO RED)                                     Popsicles (NO RED)                                                                  Juice: NO CITRUS JUICES: only apple, WHITE grape, WHITE cranberry Sports drinks like Gatorade or Powerade (NO RED)                   The day of surgery:  Drink ONE (1) Pre-Surgery Clear Ensure  at  09:30   AM the morning  of surgery. Drink in one sitting. Do not sip.  This drink was given to you during your hospital pre-op appointment visit. Nothing else to drink after completing the Pre-Surgery Clear Ensure  : No candy, chewing gum or throat lozenges.    FOLLOW  ANY ADDITIONAL PRE OP INSTRUCTIONS YOU RECEIVED FROM YOUR SURGEON'S OFFICE!!!   Oral Hygiene is also important to reduce your risk of infection.        Remember - BRUSH YOUR TEETH THE MORNING OF SURGERY WITH YOUR REGULAR TOOTHPASTE  Do NOT smoke after Midnight the night before surgery.  Stop taking ASPIRIN 81 mg 1 week before your surgery.   STOP TAKING all Vitamins, Herbs and supplements 1 week before your surgery.   Take ONLY these medicines the morning of surgery with A SIP OF WATER: Tylenol if needed,  You may take alprazolam (Xanax) if needed for anxiety. You may use your Nasacort Nasal Spray if needed     You may not have any metal on your body including hair pins, jewelry, and body piercing  Do not wear make-up, lotions, powders, perfumes, or deodorant  Do not wear nail polish including gel and S&S, artificial / acrylic nails, or any other type of covering on natural nails including finger and toenails. If you have artificial nails, gel coating, etc., that needs to be removed by a nail salon, Please have this removed prior to surgery. Not doing so may mean that your surgery could be cancelled or delayed if the  Surgeon or anesthesia staff feels like they are unable to monitor you safely.   Do not shave 48 hours prior to surgery to avoid nicks in your skin which may contribute to postoperative infections.    Contacts, Hearing Aids, dentures or bridgework may not be worn into surgery. DENTURES WILL BE REMOVED PRIOR TO SURGERY PLEASE DO NOT APPLY "Poly grip" OR ADHESIVES!!!  You may bring a small overnight bag with you on the day of surgery, only pack items that are not valuable. [facility] IS NOT RESPONSIBLE   FOR VALUABLES THAT ARE LOST OR STOLEN.    Do not bring your home medications to the hospital. The Pharmacy will dispense medications listed on your medication list to you during your admission in the Hospital.   Please read over the following fact sheets you were given: IF YOU HAVE QUESTIONS ABOUT YOUR PRE-OP INSTRUCTIONS, PLEASE CALL [phone].     Pre-operative 5 CHG Bath Instructions   You can play a key role in reducing the risk of infection after surgery. Your skin needs to be as free of germs as possible. You can reduce the number of germs on your skin by washing with CHG (chlorhexidine gluconate) soap before surgery. CHG is an antiseptic soap that kills germs and continues to kill germs even after washing.   DO NOT use if you have an allergy to chlorhexidine/CHG or antibacterial soaps. If your skin becomes reddened or irritated, stop using the CHG and notify one of our RNs at [phone]  Please shower with the CHG soap starting 4 days before surgery using the following schedule: START SHOWERS ON   THURSDAY  October 11, 2022  Please keep in mind the following:  DO NOT shave, including legs and underarms, starting the day of your first shower.   You may shave your face at any point before/day of  surgery.   Place clean sheets on your bed the day you start using CHG soap. Use a clean washcloth (not used since being washed) for each shower. DO NOT sleep with pets once you start using the CHG.   CHG Shower Instructions:  If you choose to wash your hair and private area, wash first with your normal shampoo/soap.  After you use shampoo/soap, rinse your hair and body thoroughly to remove shampoo/soap residue.  Turn the water OFF and apply about 3 tablespoons (45 ml) of CHG soap to a CLEAN washcloth.  Apply CHG soap ONLY FROM YOUR NECK DOWN TO YOUR TOES (washing for 3-5 minutes)  DO NOT use CHG soap on face, private areas, open wounds, or sores.  Pay special attention to the area where your surgery is being performed.  If you are having back surgery, having someone wash your back for you may be helpful.  Wait 2 minutes after CHG soap is applied, then you may rinse off the CHG soap.  Pat dry with a clean towel  Put on clean clothes/pajamas   If you choose to wear lotion, please use ONLY the CHG-compatible lotions on the back of this paper.     Additional instructions for the day of surgery: DO NOT APPLY any lotions, deodorants, cologne, or perfumes.   Put on clean/comfortable clothes.  Brush your teeth.  Ask your nurse before applying any prescription medications to the skin.      CHG Compatible Lotions   Aveeno Moisturizing lotion  Cetaphil Moisturizing Cream  Cetaphil Moisturizing Lotion  Clairol Herbal Essence Moisturizing Lotion, Dry Skin  Clairol Herbal Essence Moisturizing Lotion, Extra Dry Skin  Clairol Herbal Essence Moisturizing Lotion, Normal Skin  Curel Age Defying Therapeutic Moisturizing Lotion with Alpha Hydroxy  Curel Extreme Care Body Lotion  Curel Soothing Hands Moisturizing Hand Lotion  Curel Therapeutic Moisturizing Cream, Fragrance-Free  Curel Therapeutic Moisturizing Lotion, Fragrance-Free  Curel Therapeutic Moisturizing Lotion, Original Formula   Eucerin Daily Replenishing Lotion  Eucerin Dry Skin Therapy Plus Alpha Hydroxy Crme  Eucerin Dry Skin Therapy Plus Alpha Hydroxy Lotion  Eucerin Original Crme  Eucerin Original Lotion  Eucerin Plus Crme Eucerin Plus Lotion  Eucerin TriLipid Replenishing Lotion  Keri Anti-Bacterial Hand Lotion  Keri Deep Conditioning Original Lotion Dry Skin Formula Softly Scented  Keri Deep Conditioning Original Lotion, Fragrance Free Sensitive Skin Formula  Keri Lotion Fast Absorbing Fragrance Free Sensitive Skin Formula  Keri Lotion Fast Absorbing Softly Scented Dry Skin Formula  Keri Original Lotion  Keri Skin Renewal Lotion Keri Silky Smooth Lotion  Keri Silky Smooth Sensitive Skin Lotion  Nivea Body Creamy Conditioning Oil  Nivea Body Extra Enriched Lotion  Nivea Body Original Lotion  Nivea Body Sheer Moisturizing Lotion Nivea Crme  Nivea Skin Firming Lotion  NutraDerm 30 Skin Lotion  NutraDerm Skin Lotion  NutraDerm Therapeutic Skin Cream  NutraDerm Therapeutic Skin Lotion  ProShield Protective Hand Cream  Provon moisturizing lotion   FAILURE TO FOLLOW THESE INSTRUCTIONS MAY RESULT IN THE CANCELLATION OF YOUR SURGERY  PATIENT SIGNATURE_________________________________  NURSE SIGNATURE__________________________________  ________________________________________________________________________      Grace Thomas    An incentive spirometer is a tool that can help keep your lungs clear and active. This tool measures how well you are filling your lungs with each breath. Taking long  deep breaths may help reverse or decrease the chance of developing breathing (pulmonary) problems (especially infection) following: A long period of time when you are unable to move or be active. BEFORE THE PROCEDURE  If the spirometer includes an indicator to show your best effort, your nurse or respiratory therapist will set it to a desired goal. If possible, sit up straight or lean slightly  forward. Try not to slouch. Hold the incentive spirometer in an upright position. INSTRUCTIONS FOR USE  Sit on the edge of your bed if possible, or sit up as far as you can in bed or on a chair. Hold the incentive spirometer in an upright position. Breathe out normally. Place the mouthpiece in your mouth and seal your lips tightly around it. Breathe in slowly and as deeply as possible, raising the piston or the ball toward the top of the column. Hold your breath for 3-5 seconds or for as long as possible. Allow the piston or ball to fall to the bottom of the column. Remove the mouthpiece from your mouth and breathe out normally. Rest for a few seconds and repeat Steps 1 through 7 at least 10 times every 1-2 hours when you are awake. Take your time and take a few normal breaths between deep breaths. The spirometer may include an indicator to show your best effort. Use the indicator as a goal to work toward during each repetition. After each set of 10 deep breaths, practice coughing to be sure your lungs are clear. If you have an incision (the cut made at the time of surgery), support your incision when coughing by placing a pillow or rolled up towels firmly against it. Once you are able to get out of bed, walk around indoors and cough well. You may stop using the incentive spirometer when instructed by your caregiver.  RISKS AND COMPLICATIONS Take your time so you do not get dizzy or light-headed. If you are in pain, you may need to take or ask for pain medication before doing incentive spirometry. It is harder to take a deep breath if you are having pain. AFTER USE Rest and breathe slowly and easily. It can be helpful to keep track of a log of your progress. Your caregiver can provide you with a simple table to help with this. If you are using the spirometer at home, follow these instructions: SEEK MEDICAL CARE IF:  You are having difficultly using the spirometer. You have trouble using the  spirometer as often as instructed. Your pain medication is not giving enough relief while using the spirometer. You develop fever of 100.5 F (38.1 C) or higher.                                                                                                    SEEK IMMEDIATE MEDICAL CARE IF:  You cough up bloody sputum that had not been present before. You develop fever of 102 F (38.9 C) or greater. You develop worsening pain at or near the incision site. MAKE SURE YOU:  Understand these instructions. Will watch your condition. Will  get help right away if you are not doing well or get worse. Document Released: 05/07/2006 Document Revised: 03/19/2011 Document Reviewed: 07/08/2006 [facility] Patient Information 2014 [place], [place].     WHAT IS A BLOOD TRANSFUSION? Blood Transfusion Information  A transfusion is the replacement of blood or some of its parts. Blood is made up of multiple cells which provide different functions. Red blood cells carry oxygen and are used for blood loss replacement. White blood cells fight against infection. Platelets control bleeding. Plasma helps clot blood. Other blood products are available for specialized needs, such as hemophilia or other clotting disorders. BEFORE THE TRANSFUSION  Who gives blood for transfusions?  Healthy volunteers who are fully evaluated to make sure their blood is safe. This is blood bank blood. Transfusion therapy is the safest it has ever been in the practice of medicine. Before blood is taken from a donor, a complete history is taken to make sure that person has no history of diseases nor engages in risky social behavior (examples are intravenous drug use or sexual activity with multiple partners). The donor's travel history is screened to minimize risk of transmitting infections, such as malaria. The donated blood is tested for signs of infectious diseases, such as HIV and hepatitis. The blood is then tested to be sure it is  compatible with you in order to minimize the chance of a transfusion reaction. If you or a relative donates blood, this is often done in anticipation of surgery and is not appropriate for emergency situations. It takes many days to process the donated blood. RISKS AND COMPLICATIONS Although transfusion therapy is very safe and saves many lives, the main dangers of transfusion include:  Getting an infectious disease. Developing a transfusion reaction. This is an allergic reaction to something in the blood you were given. Every precaution is taken to prevent this. The decision to have a blood transfusion has been considered carefully by your caregiver before blood is given. Blood is not given unless the benefits outweigh the risks. AFTER THE TRANSFUSION Right after receiving a blood transfusion, you will usually feel much better and more energetic. This is especially true if your red blood cells have gotten low (anemic). The transfusion raises the level of the red blood cells which carry oxygen, and this usually causes an energy increase. The nurse administering the transfusion will monitor you carefully for complications. HOME CARE INSTRUCTIONS  No special instructions are needed after a transfusion. You may find your energy is better. Speak with your caregiver about any limitations on activity for underlying diseases you may have. SEEK MEDICAL CARE IF:  Your condition is not improving after your transfusion. You develop redness or irritation at the intravenous (IV) site. SEEK IMMEDIATE MEDICAL CARE IF:  Any of the following symptoms occur over the next 12 hours: Shaking chills. You have a temperature by mouth above 102 F (38.9 C), not controlled by medicine. Chest, back, or muscle pain. People around you feel you are not acting correctly or are confused. Shortness of breath or difficulty breathing. Dizziness and fainting. You get a rash or develop hives. You have a decrease in urine  output. Your urine turns a dark color or changes to pink, red, or brown. Any of the following symptoms occur over the next 10 days: You have a temperature by mouth above 102 F (38.9 C), not controlled by medicine. Shortness of breath. Weakness after normal activity. The white part of the eye turns yellow (jaundice). You have a decrease in  the amount of urine or are urinating less often. Your urine turns a dark color or changes to pink, red, or brown. Document Released: 12/23/1999 Document Revised: 03/19/2011 Document Reviewed: 08/11/2007 [facility] Patient Information 2014 [place], [place].  _______________________________________________________________________

## 2022-10-05 ENCOUNTER — Encounter (HOSPITAL_COMMUNITY): Payer: Self-pay

## 2022-10-05 ENCOUNTER — Encounter (HOSPITAL_COMMUNITY)
Admission: RE | Admit: 2022-10-05 | Discharge: 2022-10-05 | Disposition: A | Discharge status: Home / Self Care | Payer: Medicare Other | Source: Ambulatory Visit | Attending: Orthopedic Surgery | Admitting: Orthopedic Surgery

## 2022-10-05 ENCOUNTER — Other Ambulatory Visit: Payer: Self-pay

## 2022-10-05 VITALS — BP 130/66 | HR 80 | Temp 98.0°F | Resp 14 | Ht 64.0 in | Wt 182.0 lb

## 2022-10-05 DIAGNOSIS — M25561 Pain in right knee: Secondary | ICD-10-CM | POA: Insufficient documentation

## 2022-10-05 DIAGNOSIS — Z79899 Other long term (current) drug therapy: Secondary | ICD-10-CM | POA: Insufficient documentation

## 2022-10-05 DIAGNOSIS — Z01818 Encounter for other preprocedural examination: Secondary | ICD-10-CM

## 2022-10-05 DIAGNOSIS — Z01812 Encounter for preprocedural laboratory examination: Secondary | ICD-10-CM | POA: Diagnosis not present

## 2022-10-05 DIAGNOSIS — Z952 Presence of prosthetic heart valve: Secondary | ICD-10-CM | POA: Diagnosis not present

## 2022-10-05 DIAGNOSIS — G8929 Other chronic pain: Secondary | ICD-10-CM | POA: Diagnosis not present

## 2022-10-05 DIAGNOSIS — M1711 Unilateral primary osteoarthritis, right knee: Secondary | ICD-10-CM | POA: Diagnosis not present

## 2022-10-05 HISTORY — DX: Family history of other specified conditions: Z84.89

## 2022-10-05 HISTORY — DX: Depression, unspecified: F32.A

## 2022-10-05 LAB — COMPREHENSIVE METABOLIC PANEL
ALT: 14 U/L (ref 0–44)
AST: 18 U/L (ref 15–41)
Albumin: 4.2 g/dL (ref 3.5–5.0)
Alkaline Phosphatase: 65 U/L (ref 38–126)
Anion gap: 12 (ref 5–15)
BUN: 15 mg/dL (ref 8–23)
CO2: 26 mmol/L (ref 22–32)
Calcium: 9.1 mg/dL (ref 8.9–10.3)
Chloride: 105 mmol/L (ref 98–111)
Creatinine, Ser: 0.59 mg/dL (ref 0.44–1.00)
GFR, Estimated: >60 mL/min (ref >60)
Glucose, Bld: 82 mg/dL (ref 70–99)
Potassium: 3.8 mmol/L (ref 3.5–5.1)
Sodium: 143 mmol/L (ref 135–145)
Total Bilirubin: 0.6 mg/dL (ref 0.3–1.2)
Total Protein: 7.3 g/dL (ref 6.5–8.1)

## 2022-10-05 LAB — CBC WITH DIFFERENTIAL/PLATELET
Abs Immature Granulocytes: 0.01 10*3/uL (ref 0.00–0.07)
Basophils Absolute: 0 10*3/uL (ref 0.0–0.1)
Basophils Relative: 1 %
Eosinophils Absolute: 0.2 10*3/uL (ref 0.0–0.5)
Eosinophils Relative: 3 %
HCT: 40.5 % (ref 36.0–46.0)
Hemoglobin: 13 g/dL (ref 12.0–15.0)
Immature Granulocytes: 0 %
Lymphocytes Relative: 38 %
Lymphs Abs: 2.7 10*3/uL (ref 0.7–4.0)
MCH: 31.1 pg (ref 26.0–34.0)
MCHC: 32.1 g/dL (ref 30.0–36.0)
MCV: 96.9 fL (ref 80.0–100.0)
Monocytes Absolute: 0.6 10*3/uL (ref 0.1–1.0)
Monocytes Relative: 9 %
Neutro Abs: 3.5 10*3/uL (ref 1.7–7.7)
Neutrophils Relative %: 49 %
Platelets: 263 10*3/uL (ref 150–400)
RBC: 4.18 MIL/uL (ref 3.87–5.11)
RDW: 12.1 % (ref 11.5–15.5)
WBC: 7 10*3/uL (ref 4.0–10.5)
nRBC: 0 % (ref 0.0–0.2)

## 2022-10-05 LAB — SURGICAL PCR SCREEN
MRSA, PCR: NEGATIVE
Staphylococcus aureus: NEGATIVE

## 2022-10-08 NOTE — Progress Notes (Signed)
Anesthesia Chart Review   Case: [id number] Date/Time: 10/15/22 1215   Procedure: TOTAL KNEE ARTHROPLASTY (Right: Knee)   Anesthesia type: Spinal   Pre-op diagnosis: OA RIGHT KNEE   Location: [facility] ROOM 08 / [facility]   Surgeons: Joen Laura, MD       DISCUSSION:72 y.o. never smoker s/p TAVR 10/22, right knee OA scheduled for above procedure 10/15/2022 with Dr. Weber Cooks.   H/o cervical spine fusion C4-5, C5-6.   Pt last seen by cardiology 09/14/2022. Per OV note, "According to the Revised Cardiac Risk Index (RCRI), her Perioperative Risk of Major Cardiac Event is (%): 0.4   Her Functional Capacity in METs is: 7.99 according to the Duke Activity Status Index (DASI).   Per office protocol, if patient is without any new symptoms or concerns at the time of their virtual visit, he/she may hold ASA for 7 days prior to procedure. Please resume ASA as soon as possible postprocedure, at the discretion of the surgeon.     The patient was advised that if she develops new symptoms prior to surgery to contact our office to arrange for a follow-up visit, and she verbalized understanding."  Anticipate pt can proceed with planned procedure barring acute status change.   VS: BP 130/66 Comment: right arm sitting  Pulse 80   Temp 36.7 C (Oral)   Resp 14   Ht 5\' 4"  (1.626 m)   Wt 82.6 kg   SpO2 97%   BMI 31.24 kg/m   PROVIDERS: Toma Deiters, MD is PCP   Cardiologist - Nona Dell, MD  LABS: Labs reviewed: Acceptable for surgery. (all labs ordered are listed, but only abnormal results are displayed)  Labs Reviewed  SURGICAL PCR SCREEN  CBC WITH DIFFERENTIAL/PLATELET  COMPREHENSIVE METABOLIC PANEL  TYPE AND SCREEN     IMAGES:   EKG:   CV: Echo 10/25/2021 1. Left ventricular ejection fraction, by estimation, is 60 to 65%. The  left ventricle has normal function. The left ventricle has no regional  wall motion abnormalities. Left ventricular diastolic parameters  were  normal.   2. Right ventricular systolic function is normal. The right ventricular  size is normal. There is normal pulmonary artery systolic pressure.   3. MV is mildly thickened with minimally restricted motion.. The mitral  valve is abnormal. Mild mitral valve regurgitation.   4. S/p TAVR (23 mm Sapien prosthesis, procedure date 11/01/20) Peak and  mean gradients through the valve are 15 and 9 mm Hg respectively AVA (VTI)  is 1.25 cm2 . No significant change from report in November 2022.. The  aortic valve has been  repaired/replaced. Aortic valve regurgitation is not visualized. There is  a 23 mm Sapien prosthetic (TAVR) valve present in the aortic position.  Procedure Date: 11/01/2020.   5. The inferior vena cava is normal in size with greater than 50%  respiratory variability, suggesting right atrial pressure of 3 mmHg.  Past Medical History:  Diagnosis Date   Anxiety    Aortic stenosis 11/01/2020   TAVR   Arthritis    Asthma due to seasonal allergies    Pt.denies 12/18/21   Back pain    COVID-26 July 2020   Depression    Family history of adverse reaction to anesthesia    Daughter has PONV   GERD (gastroesophageal reflux disease)    " a little"   Heart murmur    Knee pain    had gel shots for bilateral 12/05/21 was  last injection   Recurrent UTI    S/P TAVR (transcatheter aortic valve replacement) 11/01/2020   Edwards 23mm S3U TF approach with Dr. Edison Pace    Past Surgical History:  Procedure Laterality Date   ABDOMINAL HYSTERECTOMY  1994   TAH BSO   ANTERIOR CERVICAL DECOMP/DISCECTOMY FUSION N/A 02/28/2021   Procedure: Cervical Four-Five Cervical Five-Six Anterior Cervical Decompression Fusion;  Surgeon: Bethann Goo, DO;  Location: [facility] OR;  Service: Neurosurgery;  Laterality: N/A;  3C/RM 19   CARDIAC CATHETERIZATION     CARPAL TUNNEL RELEASE Bilateral    COLONOSCOPY     REVERSE SHOULDER ARTHROPLASTY Right 06/22/2021   Procedure: REVERSE  SHOULDER ARTHROPLASTY;  Surgeon: Bjorn Pippin, MD;  Location: [facility];  Service: Orthopedics;  Laterality: Right;   RIGHT/LEFT HEART CATH AND CORONARY ANGIOGRAPHY N/A 09/05/2020   Procedure: RIGHT/LEFT HEART CATH AND CORONARY ANGIOGRAPHY;  Surgeon: Tonny Bollman, MD;  Location: [facility] INVASIVE CV LAB;  Service: Cardiovascular;  Laterality: N/A;   ROTATOR CUFF REPAIR Right 2004   TEE WITHOUT CARDIOVERSION N/A 11/01/2020   Procedure: TRANSESOPHAGEAL ECHOCARDIOGRAM (TEE);  Surgeon: Tonny Bollman, MD;  Location: [facility] OR;  Service: Open Heart Surgery;  Laterality: N/A;   TRANSCATHETER AORTIC VALVE REPLACEMENT, TRANSFEMORAL N/A 11/01/2020   Procedure: TRANSCATHETER AORTIC VALVE REPLACEMENT, TRANSFEMORAL;  Surgeon: Tonny Bollman, MD;  Location: [facility] OR;  Service: Open Heart Surgery;  Laterality: N/A;   TUBAL LIGATION      MEDICATIONS:  acetaminophen (TYLENOL) 500 MG tablet   ALPRAZolam (XANAX) 0.25 MG tablet   amoxicillin (AMOXIL) 500 MG capsule   aspirin EC 81 MG tablet   celecoxib (CELEBREX) 100 MG capsule   Cholecalciferol (VITAMIN D3) 50 MCG (2000 UT) TABS   fexofenadine (ALLEGRA) 180 MG tablet   fluconazole (DIFLUCAN) 150 MG tablet   miconazole (MONISTAT 7) 2 % vaginal cream   triamcinolone (NASACORT) 55 MCG/ACT AERO nasal inhaler   vitamin B-12 (CYANOCOBALAMIN) 500 MCG tablet   Zinc 50 MG TABS   No current facility-administered medications for this encounter.     Jodell Cipro Ward, PA-C [facility] Pre-Surgical Testing [phone]

## 2022-10-08 NOTE — Anesthesia Preprocedure Evaluation (Addendum)
Anesthesia Evaluation  Patient identified by MRN, date of birth, ID band Patient awake    Reviewed: Allergy & Precautions, NPO status , Patient's Chart, lab work & pertinent test results  Airway Mallampati: II  TM Distance: >3 FB Neck ROM: Full    Dental  (+) Caps, Dental Advisory Given   Pulmonary asthma    Pulmonary exam normal breath sounds clear to auscultation       Cardiovascular Normal cardiovascular exam+ Valvular Problems/Murmurs (s/p TAVR) AS  Rhythm:Regular Rate:Normal  TTE 2023 1. Left ventricular ejection fraction, by estimation, is 60 to 65%. The  left ventricle has normal function. The left ventricle has no regional  wall motion abnormalities. Left ventricular diastolic parameters were  normal.   2. Right ventricular systolic function is normal. The right ventricular  size is normal. There is normal pulmonary artery systolic pressure.   3. MV is mildly thickened with minimally restricted motion.. The mitral  valve is abnormal. Mild mitral valve regurgitation.   4. S/p TAVR (23 mm Sapien prosthesis, procedure date 11/01/20) Peak and  mean gradients through the valve are 15 and 9 mm Hg respectively AVA (VTI)  is 1.25 cm2 . No significant change from report in November 2022.. The  aortic valve has been  repaired/replaced. Aortic valve regurgitation is not visualized. There is  a 23 mm Sapien prosthetic (TAVR) valve present in the aortic position.  Procedure Date: 11/01/2020.   5. The inferior vena cava is normal in size with greater than 50%  respiratory variability, suggesting right atrial pressure of 3 mmHg.   Cath 2022 1.  Angiographically normal coronary arteries. 2.  Calcification of the aortic valve with restricted leaflet opening, hemodynamic findings consistent with moderately severe aortic stenosis with peak to peak gradient 29 mmHg, mean gradient 22 mmHg, calculated valve area 1.1 cm 3.  Normal right  heart pressures     Neuro/Psych  PSYCHIATRIC DISORDERS Anxiety Depression    negative neurological ROS     GI/Hepatic Neg liver ROS,GERD  ,,  Endo/Other  negative endocrine ROS    Renal/GU negative Renal ROS  negative genitourinary   Musculoskeletal  (+) Arthritis ,    Abdominal   Peds  Hematology negative hematology ROS (+)   Anesthesia Other Findings   Reproductive/Obstetrics                             Anesthesia Physical Anesthesia Plan  ASA: 3  Anesthesia Plan: Spinal and Regional   Post-op Pain Management: Regional block* and Tylenol PO (pre-op)*   Induction:   PONV Risk Score and Plan: 2 and Treatment may vary due to age or medical condition, Propofol infusion, Dexamethasone and Ondansetron  Airway Management Planned: Natural Airway  Additional Equipment:   Intra-op Plan:   Post-operative Plan:   Informed Consent: I have reviewed the patients History and Physical, chart, labs and discussed the procedure including the risks, benefits and alternatives for the proposed anesthesia with the patient or authorized representative who has indicated his/her understanding and acceptance.     Dental advisory given  Plan Discussed with: CRNA  Anesthesia Plan Comments: (See PAT note 10/05/2022, Christeen Douglas, PA-C)       Anesthesia Quick Evaluation

## 2022-10-09 DIAGNOSIS — M25572 Pain in left ankle and joints of left foot: Secondary | ICD-10-CM | POA: Diagnosis not present

## 2022-10-11 NOTE — Care Plan (Signed)
Ortho Bundle Case Management Note  Patient Details  Name: Grace Thomas MRN: [id number] Date of Birth: August 26, 1950  met with patient and daughter in the office for H&P. will discharge to home with family assistance. has DME at home. HHPT set up with [facility] and OPPT set up with [facility]. discharge instructions discussed and questions answered. Patient and MD in agreement with plan. Choice offered                     DME Arranged:  [facility] rolling DME Agency:  [employer]  HH Arranged:  PT HH Agency:  [facility]  Additional Comments: Please contact me with any questions of if this plan should need to change.  Shauna Hugh,  RN,BSN,MHA,CCM  [facility]opa[facility]ist  [phone] 10/11/2022, 10:32 AM

## 2022-10-15 ENCOUNTER — Encounter (HOSPITAL_COMMUNITY)
Admission: RE | Disposition: A | Discharge status: Home Health | Payer: Self-pay | Source: Ambulatory Visit | Attending: Orthopedic Surgery

## 2022-10-15 ENCOUNTER — Ambulatory Visit (HOSPITAL_COMMUNITY): Payer: Medicare Other | Admitting: Physician Assistant

## 2022-10-15 ENCOUNTER — Ambulatory Visit (HOSPITAL_COMMUNITY): Payer: Medicare Other | Admitting: Anesthesiology

## 2022-10-15 ENCOUNTER — Other Ambulatory Visit: Payer: Self-pay

## 2022-10-15 ENCOUNTER — Observation Stay (HOSPITAL_COMMUNITY): Payer: Medicare Other

## 2022-10-15 ENCOUNTER — Observation Stay (HOSPITAL_COMMUNITY)
Admission: RE | Admit: 2022-10-15 | Discharge: 2022-10-16 | Disposition: A | Discharge status: Home Health | Payer: Medicare Other | Source: Ambulatory Visit | Attending: Orthopedic Surgery | Admitting: Orthopedic Surgery

## 2022-10-15 ENCOUNTER — Encounter (HOSPITAL_COMMUNITY): Payer: Self-pay | Admitting: Orthopedic Surgery

## 2022-10-15 DIAGNOSIS — F413 Other mixed anxiety disorders: Secondary | ICD-10-CM

## 2022-10-15 DIAGNOSIS — Z8616 Personal history of COVID-19: Secondary | ICD-10-CM | POA: Insufficient documentation

## 2022-10-15 DIAGNOSIS — M1711 Unilateral primary osteoarthritis, right knee: Secondary | ICD-10-CM

## 2022-10-15 DIAGNOSIS — Z7982 Long term (current) use of aspirin: Secondary | ICD-10-CM | POA: Diagnosis not present

## 2022-10-15 DIAGNOSIS — Z96611 Presence of right artificial shoulder joint: Secondary | ICD-10-CM | POA: Diagnosis not present

## 2022-10-15 DIAGNOSIS — J45909 Unspecified asthma, uncomplicated: Secondary | ICD-10-CM | POA: Diagnosis not present

## 2022-10-15 DIAGNOSIS — Z471 Aftercare following joint replacement surgery: Secondary | ICD-10-CM | POA: Diagnosis not present

## 2022-10-15 DIAGNOSIS — G8929 Other chronic pain: Secondary | ICD-10-CM

## 2022-10-15 DIAGNOSIS — Z79899 Other long term (current) drug therapy: Secondary | ICD-10-CM | POA: Insufficient documentation

## 2022-10-15 DIAGNOSIS — G8918 Other acute postprocedural pain: Secondary | ICD-10-CM | POA: Diagnosis not present

## 2022-10-15 HISTORY — PX: TOTAL KNEE ARTHROPLASTY: SHX125

## 2022-10-15 LAB — TYPE AND SCREEN
ABO/RH(D): A POS
Antibody Screen: NEGATIVE

## 2022-10-15 SURGERY — ARTHROPLASTY, KNEE, TOTAL
Anesthesia: Regional | Site: Knee | Laterality: Right

## 2022-10-15 MED ORDER — BUPIVACAINE LIPOSOME 1.3 % IJ SUSP
INTRAMUSCULAR | Status: DC | PRN
  Administered 2022-10-15: 20 mL

## 2022-10-15 MED ORDER — ORAL CARE MOUTH RINSE: 15.0000 mL | Freq: Once | OROMUCOSAL | Status: AC

## 2022-10-15 MED ORDER — ONDANSETRON HCL 4 MG PO TABS: 4.0000 mg | ORAL_TABLET | Freq: Three times a day (TID) | ORAL | 0 refills | Status: AC | PRN

## 2022-10-15 MED ORDER — CEFAZOLIN SODIUM-DEXTROSE 2-4 GM/100ML-% IV SOLN
INTRAVENOUS | Status: AC
  Filled 2022-10-15: qty 100

## 2022-10-15 MED ORDER — BUPIVACAINE-EPINEPHRINE 0.25% -1:200000 IJ SOLN
INTRAMUSCULAR | Status: AC
  Filled 2022-10-15: qty 1

## 2022-10-15 MED ORDER — ACETAMINOPHEN 500 MG PO TABS
1000.0000 mg | ORAL_TABLET | Freq: Once | ORAL | Status: AC
  Administered 2022-10-15: 1000 mg via ORAL
  Filled 2022-10-15: qty 2

## 2022-10-15 MED ORDER — TRIAMCINOLONE ACETONIDE 55 MCG/ACT NA AERO: 2.0000 | INHALATION_SPRAY | Freq: Every day | NASAL | Status: DC | PRN

## 2022-10-15 MED ORDER — ISOPROPYL ALCOHOL 70 % SOLN
Status: DC | PRN
  Administered 2022-10-15: 1 via TOPICAL

## 2022-10-15 MED ORDER — HYDROMORPHONE HCL 1 MG/ML IJ SOLN
0.5000 mg | INTRAMUSCULAR | Status: DC | PRN
  Administered 2022-10-15: 1 mg via INTRAVENOUS

## 2022-10-15 MED ORDER — ACETAMINOPHEN 500 MG PO TABS
ORAL_TABLET | ORAL | Status: AC
  Filled 2022-10-15: qty 2

## 2022-10-15 MED ORDER — SODIUM CHLORIDE 0.9 % IV SOLN: INTRAVENOUS | Status: DC

## 2022-10-15 MED ORDER — POLYETHYLENE GLYCOL 3350 17 G PO PACK: 17.0000 g | PACK | Freq: Every day | ORAL | Status: DC | PRN

## 2022-10-15 MED ORDER — METHOCARBAMOL 500 MG PO TABS: 500.0000 mg | ORAL_TABLET | Freq: Four times a day (QID) | ORAL | Status: DC | PRN

## 2022-10-15 MED ORDER — ASPIRIN 81 MG PO TBEC: 81.0000 mg | DELAYED_RELEASE_TABLET | Freq: Two times a day (BID) | ORAL | Status: AC

## 2022-10-15 MED ORDER — TRANEXAMIC ACID-NACL 1000-0.7 MG/100ML-% IV SOLN
1000.0000 mg | INTRAVENOUS | Status: AC
  Administered 2022-10-15: 1000 mg via INTRAVENOUS
  Filled 2022-10-15: qty 100

## 2022-10-15 MED ORDER — ONDANSETRON HCL 4 MG/2ML IJ SOLN: 4.0000 mg | Freq: Four times a day (QID) | INTRAMUSCULAR | Status: DC | PRN

## 2022-10-15 MED ORDER — ACETAMINOPHEN 500 MG PO TABS
1000.0000 mg | ORAL_TABLET | Freq: Four times a day (QID) | ORAL | Status: AC
  Administered 2022-10-15 – 2022-10-16 (×4): 1000 mg via ORAL
  Filled 2022-10-15 (×3): qty 2

## 2022-10-15 MED ORDER — CHLORHEXIDINE GLUCONATE 0.12 % MT SOLN
15.0000 mL | Freq: Once | OROMUCOSAL | Status: AC
  Administered 2022-10-15: 15 mL via OROMUCOSAL

## 2022-10-15 MED ORDER — PROPOFOL 1000 MG/100ML IV EMUL
INTRAVENOUS | Status: AC
  Filled 2022-10-15: qty 100

## 2022-10-15 MED ORDER — LACTATED RINGERS IV SOLN: INTRAVENOUS | Status: DC

## 2022-10-15 MED ORDER — PHENYLEPHRINE HCL-NACL 20-0.9 MG/250ML-% IV SOLN
INTRAVENOUS | Status: DC | PRN
  Administered 2022-10-15: 35 ug/min via INTRAVENOUS

## 2022-10-15 MED ORDER — DEXAMETHASONE SODIUM PHOSPHATE 10 MG/ML IJ SOLN
8.0000 mg | Freq: Once | INTRAMUSCULAR | Status: AC
  Administered 2022-10-15: 8 mg via INTRAVENOUS

## 2022-10-15 MED ORDER — BUPIVACAINE IN DEXTROSE 0.75-8.25 % IT SOLN
INTRATHECAL | Status: DC | PRN
  Administered 2022-10-15: 1.8 mL via INTRATHECAL

## 2022-10-15 MED ORDER — PANTOPRAZOLE SODIUM 40 MG PO TBEC
40.0000 mg | DELAYED_RELEASE_TABLET | Freq: Every day | ORAL | Status: DC
  Administered 2022-10-15 – 2022-10-16 (×2): 40 mg via ORAL
  Filled 2022-10-15 (×2): qty 1

## 2022-10-15 MED ORDER — SODIUM CHLORIDE 0.9 % IR SOLN
Status: DC | PRN
  Administered 2022-10-15: 3000 mL

## 2022-10-15 MED ORDER — DEXAMETHASONE SODIUM PHOSPHATE 10 MG/ML IJ SOLN
INTRAMUSCULAR | Status: AC
  Filled 2022-10-15: qty 1

## 2022-10-15 MED ORDER — ONDANSETRON HCL 4 MG/2ML IJ SOLN
INTRAMUSCULAR | Status: AC
  Filled 2022-10-15: qty 2

## 2022-10-15 MED ORDER — OXYCODONE HCL 5 MG PO TABS
ORAL_TABLET | ORAL | Status: AC
  Filled 2022-10-15: qty 2

## 2022-10-15 MED ORDER — BUPIVACAINE-EPINEPHRINE 0.25% -1:200000 IJ SOLN
INTRAMUSCULAR | Status: DC | PRN
  Administered 2022-10-15: 30 mL

## 2022-10-15 MED ORDER — ACETAMINOPHEN 500 MG PO TABS: 1000.0000 mg | ORAL_TABLET | Freq: Three times a day (TID) | ORAL | Status: AC | PRN

## 2022-10-15 MED ORDER — FENTANYL CITRATE PF 50 MCG/ML IJ SOSY
PREFILLED_SYRINGE | INTRAMUSCULAR | Status: AC
  Administered 2022-10-15: 50 ug
  Filled 2022-10-15: qty 2

## 2022-10-15 MED ORDER — POLYETHYLENE GLYCOL 3350 17 G PO PACK: 17.0000 g | PACK | Freq: Every day | ORAL | 0 refills | Status: DC

## 2022-10-15 MED ORDER — ONDANSETRON HCL 4 MG PO TABS: 4.0000 mg | ORAL_TABLET | Freq: Four times a day (QID) | ORAL | Status: DC | PRN

## 2022-10-15 MED ORDER — PROPOFOL 500 MG/50ML IV EMUL
INTRAVENOUS | Status: DC | PRN
  Administered 2022-10-15: 75 ug/kg/min via INTRAVENOUS

## 2022-10-15 MED ORDER — POVIDONE-IODINE 10 % EX SWAB: 2.0000 | Freq: Once | CUTANEOUS | Status: DC

## 2022-10-15 MED ORDER — PHENOL 1.4 % MT LIQD: 1.0000 | OROMUCOSAL | Status: DC | PRN

## 2022-10-15 MED ORDER — DEXAMETHASONE SODIUM PHOSPHATE 10 MG/ML IJ SOLN
INTRAMUSCULAR | Status: DC | PRN
Start: 2022-10-15 — End: 2022-10-15
  Administered 2022-10-15: 10 mg

## 2022-10-15 MED ORDER — MENTHOL 3 MG MT LOZG: 1.0000 | LOZENGE | OROMUCOSAL | Status: DC | PRN

## 2022-10-15 MED ORDER — VITAMIN D3 25 MCG (1000 UNIT) PO TABS
2000.0000 [IU] | ORAL_TABLET | Freq: Every evening | ORAL | Status: DC
  Filled 2022-10-15: qty 2

## 2022-10-15 MED ORDER — OXYCODONE HCL 5 MG PO TABS
5.0000 mg | ORAL_TABLET | ORAL | 0 refills | Status: AC | PRN
Start: 2022-10-15 — End: 2022-10-22

## 2022-10-15 MED ORDER — ROPIVACAINE HCL 5 MG/ML IJ SOLN
INTRAMUSCULAR | Status: DC | PRN
Start: 2022-10-15 — End: 2022-10-15
  Administered 2022-10-15: 20 mL via PERINEURAL

## 2022-10-15 MED ORDER — OXYCODONE HCL 5 MG PO TABS
5.0000 mg | ORAL_TABLET | ORAL | Status: DC | PRN
  Administered 2022-10-15 – 2022-10-16 (×4): 10 mg via ORAL
  Filled 2022-10-15 (×3): qty 2

## 2022-10-15 MED ORDER — DOCUSATE SODIUM 100 MG PO CAPS
100.0000 mg | ORAL_CAPSULE | Freq: Two times a day (BID) | ORAL | Status: DC
  Administered 2022-10-15 – 2022-10-16 (×2): 100 mg via ORAL
  Filled 2022-10-15 (×2): qty 1

## 2022-10-15 MED ORDER — METHOCARBAMOL 500 MG PO TABS: 500.0000 mg | ORAL_TABLET | Freq: Three times a day (TID) | ORAL | 0 refills | Status: AC | PRN

## 2022-10-15 MED ORDER — BUPIVACAINE LIPOSOME 1.3 % IJ SUSP
INTRAMUSCULAR | Status: AC
  Filled 2022-10-15: qty 20

## 2022-10-15 MED ORDER — BUPIVACAINE LIPOSOME 1.3 % IJ SUSP: 20.0000 mL | Freq: Once | INTRAMUSCULAR | Status: DC

## 2022-10-15 MED ORDER — CEFAZOLIN SODIUM-DEXTROSE 2-4 GM/100ML-% IV SOLN
2.0000 g | Freq: Four times a day (QID) | INTRAVENOUS | Status: AC
  Administered 2022-10-15 – 2022-10-16 (×2): 2 g via INTRAVENOUS
  Filled 2022-10-15: qty 100

## 2022-10-15 MED ORDER — SODIUM CHLORIDE 0.9% FLUSH
INTRAVENOUS | Status: DC | PRN
  Administered 2022-10-15: 30 mL

## 2022-10-15 MED ORDER — ALPRAZOLAM 0.25 MG PO TABS: 0.1250 mg | ORAL_TABLET | Freq: Every day | ORAL | Status: DC | PRN

## 2022-10-15 MED ORDER — CELECOXIB 100 MG PO CAPS: 100.0000 mg | ORAL_CAPSULE | Freq: Two times a day (BID) | ORAL | 0 refills | Status: AC

## 2022-10-15 MED ORDER — PROPOFOL 10 MG/ML IV BOLUS
INTRAVENOUS | Status: DC | PRN
  Administered 2022-10-15: 20 mg via INTRAVENOUS
  Administered 2022-10-15: 30 mg via INTRAVENOUS

## 2022-10-15 MED ORDER — LORATADINE 10 MG PO TABS: 10.0000 mg | ORAL_TABLET | Freq: Every day | ORAL | Status: DC | PRN

## 2022-10-15 MED ORDER — 0.9 % SODIUM CHLORIDE (POUR BTL) OPTIME
TOPICAL | Status: DC | PRN
  Administered 2022-10-15: 1000 mL

## 2022-10-15 MED ORDER — DIPHENHYDRAMINE HCL 12.5 MG/5ML PO ELIX
12.5000 mg | ORAL_SOLUTION | ORAL | Status: DC | PRN
  Administered 2022-10-16 (×3): 25 mg via ORAL
  Filled 2022-10-15 (×3): qty 10

## 2022-10-15 MED ORDER — FENTANYL CITRATE PF 50 MCG/ML IJ SOSY: 25.0000 ug | PREFILLED_SYRINGE | INTRAMUSCULAR | Status: DC | PRN

## 2022-10-15 MED ORDER — METHOCARBAMOL 500 MG IVPB - SIMPLE MED
500.0000 mg | Freq: Four times a day (QID) | INTRAVENOUS | Status: DC | PRN
  Administered 2022-10-15: 500 mg via INTRAVENOUS

## 2022-10-15 MED ORDER — HYDROMORPHONE HCL 1 MG/ML IJ SOLN
INTRAMUSCULAR | Status: AC
  Filled 2022-10-15: qty 1

## 2022-10-15 MED ORDER — CEFAZOLIN SODIUM-DEXTROSE 2-4 GM/100ML-% IV SOLN
2.0000 g | INTRAVENOUS | Status: AC
  Administered 2022-10-15: 2 g via INTRAVENOUS
  Filled 2022-10-15: qty 100

## 2022-10-15 MED ORDER — ONDANSETRON HCL 4 MG/2ML IJ SOLN
INTRAMUSCULAR | Status: DC | PRN
  Administered 2022-10-15: 4 mg via INTRAVENOUS

## 2022-10-15 MED ORDER — KETOROLAC TROMETHAMINE 15 MG/ML IJ SOLN
7.5000 mg | Freq: Four times a day (QID) | INTRAMUSCULAR | Status: AC
  Administered 2022-10-15 – 2022-10-16 (×4): 7.5 mg via INTRAVENOUS
  Filled 2022-10-15 (×3): qty 1

## 2022-10-15 MED ORDER — ASPIRIN 81 MG PO CHEW
81.0000 mg | CHEWABLE_TABLET | Freq: Two times a day (BID) | ORAL | Status: DC
  Administered 2022-10-15 – 2022-10-16 (×2): 81 mg via ORAL
  Filled 2022-10-15 (×2): qty 1

## 2022-10-15 MED ORDER — KETOROLAC TROMETHAMINE 15 MG/ML IJ SOLN
INTRAMUSCULAR | Status: AC
  Filled 2022-10-15: qty 1

## 2022-10-15 MED ORDER — WATER FOR IRRIGATION, STERILE IR SOLN
Status: DC | PRN
  Administered 2022-10-15: 2000 mL

## 2022-10-15 MED ORDER — ACETAMINOPHEN 325 MG PO TABS: 325.0000 mg | ORAL_TABLET | Freq: Four times a day (QID) | ORAL | Status: DC | PRN

## 2022-10-15 MED ORDER — METHOCARBAMOL 500 MG IVPB - SIMPLE MED
INTRAVENOUS | Status: AC
  Filled 2022-10-15: qty 55

## 2022-10-15 SURGICAL SUPPLY — 67 items
ADH SKN CLS APL DERMABOND .7 (GAUZE/BANDAGES/DRESSINGS) ×1
APL PRP STRL LF DISP 70% ISPRP (MISCELLANEOUS) ×2
BAG COUNTER SPONGE SURGICOUNT (BAG) IMPLANT
BAG SPNG CNTER NS LX DISP (BAG)
BLADE SAG 18X100X1.27 (BLADE) ×1 IMPLANT
BLADE SAW SAG 35X64 .89 (BLADE) ×1 IMPLANT
BNDG CMPR 5X3 CHSV STRCH STRL (GAUZE/BANDAGES/DRESSINGS) ×1
BNDG CMPR MED 10X6 ELC LF (GAUZE/BANDAGES/DRESSINGS) ×1
BNDG COHESIVE 3X5 TAN ST LF (GAUZE/BANDAGES/DRESSINGS) ×1 IMPLANT
BNDG ELASTIC 6X10 VLCR STRL LF (GAUZE/BANDAGES/DRESSINGS) ×1 IMPLANT
BOWL SMART MIX CTS (DISPOSABLE) ×1 IMPLANT
BSPLAT TIB 5D C 16NT STM RT (Knees) ×1 IMPLANT
CEMENT BONE R 1X40 (Cement) IMPLANT
CEMENT BONE REFOBACIN R1X40 US (Cement) IMPLANT
CHLORAPREP W/TINT 26 (MISCELLANEOUS) ×2 IMPLANT
CLSR STERI-STRIP ANTIMIC 1/2X4 (GAUZE/BANDAGES/DRESSINGS) IMPLANT
COMP FEM CR CEMT STD SZ5 (Joint) ×1 IMPLANT
COMPONENT FEM CR CEMT STD SZ5 (Joint) IMPLANT
COVER SURGICAL LIGHT HANDLE (MISCELLANEOUS) ×1 IMPLANT
CUFF TOURN SGL QUICK 34 (TOURNIQUET CUFF) ×1
CUFF TRNQT CYL 34X4.125X (TOURNIQUET CUFF) ×1 IMPLANT
DERMABOND ADVANCED .7 DNX12 (GAUZE/BANDAGES/DRESSINGS) ×1 IMPLANT
DRAPE INCISE IOBAN 85X60 (DRAPES) ×1 IMPLANT
DRAPE SHEET LG 3/4 BI-LAMINATE (DRAPES) ×1 IMPLANT
DRAPE U-SHAPE 47X51 STRL (DRAPES) ×1 IMPLANT
DRESSING AQUACEL AG SP 3.5X10 (GAUZE/BANDAGES/DRESSINGS) ×1 IMPLANT
DRSG AQUACEL AG ADV 3.5X10 (GAUZE/BANDAGES/DRESSINGS) IMPLANT
DRSG AQUACEL AG SP 3.5X10 (GAUZE/BANDAGES/DRESSINGS) ×1
ELECT REM PT RETURN 15FT ADLT (MISCELLANEOUS) ×1 IMPLANT
GAUZE SPONGE 4X4 12PLY STRL (GAUZE/BANDAGES/DRESSINGS) ×1 IMPLANT
GLOVE BIO SURGEON STRL SZ 6.5 (GLOVE) ×2 IMPLANT
GLOVE BIOGEL PI IND STRL 6.5 (GLOVE) ×1 IMPLANT
GLOVE BIOGEL PI IND STRL 8 (GLOVE) ×1 IMPLANT
GLOVE SURG ORTHO 8.0 STRL STRW (GLOVE) ×2 IMPLANT
GOWN STRL REUS W/ TWL XL LVL3 (GOWN DISPOSABLE) ×2 IMPLANT
GOWN STRL REUS W/TWL XL LVL3 (GOWN DISPOSABLE) ×2
HANDPIECE INTERPULSE COAX TIP (DISPOSABLE) ×1
HOLDER FOLEY CATH W/STRAP (MISCELLANEOUS) ×1 IMPLANT
HOOD PEEL AWAY T7 (MISCELLANEOUS) ×3 IMPLANT
IMPL ASF RT PSN 4-5/CD 10 (Joint) IMPLANT
IMPLANT ASF RT PSN 4-5/CD 10 (Joint) ×1 IMPLANT
KIT TURNOVER KIT A (KITS) IMPLANT
MANIFOLD NEPTUNE II (INSTRUMENTS) ×1 IMPLANT
MARKER SKIN DUAL TIP RULER LAB (MISCELLANEOUS) ×1 IMPLANT
NS IRRIG 1000ML POUR BTL (IV SOLUTION) ×1 IMPLANT
PACK TOTAL KNEE CUSTOM (KITS) ×1 IMPLANT
PIN DRILL HDLS TROCAR 75 4PK (PIN) IMPLANT
SCREW HEADED 33MM KNEE (MISCELLANEOUS) IMPLANT
SET HNDPC FAN SPRY TIP SCT (DISPOSABLE) ×1 IMPLANT
SOLUTION IRRIG SURGIPHOR (IV SOLUTION) IMPLANT
SPIKE FLUID TRANSFER (MISCELLANEOUS) ×1 IMPLANT
STEM POLY PAT PLY 32M KNEE (Knees) IMPLANT
STEM TIBIA 5 DEG SZ C R KNEE (Knees) IMPLANT
STRIP CLOSURE SKIN 1/2X4 (GAUZE/BANDAGES/DRESSINGS) ×1 IMPLANT
SUT MNCRL AB 3-0 PS2 18 (SUTURE) ×1 IMPLANT
SUT STRATAFIX 0 PDS 27 VIOLET (SUTURE) ×1
SUT STRATAFIX PDO 1 14 VIOLET (SUTURE) ×1
SUT STRATFX PDO 1 14 VIOLET (SUTURE) ×1
SUT VIC AB 2-0 CT2 27 (SUTURE) ×2 IMPLANT
SUTURE STRATFX 0 PDS 27 VIOLET (SUTURE) ×1 IMPLANT
SUTURE STRATFX PDO 1 14 VIOLET (SUTURE) ×1 IMPLANT
SYR 50ML LL SCALE MARK (SYRINGE) ×1 IMPLANT
TIBIA STEM 5 DEG SZ C R KNEE (Knees) ×1 IMPLANT
TRAY FOLEY MTR SLVR 14FR STAT (SET/KITS/TRAYS/PACK) IMPLANT
TUBE SUCTION HIGH CAP CLEAR NV (SUCTIONS) ×1 IMPLANT
UNDERPAD 30X36 HEAVY ABSORB (UNDERPADS AND DIAPERS) ×1 IMPLANT
WRAP KNEE MAXI GEL POST OP (GAUZE/BANDAGES/DRESSINGS) IMPLANT

## 2022-10-15 NOTE — Anesthesia Postprocedure Evaluation (Signed)
Anesthesia Post Note  Patient: Grace Thomas  Procedure(s) Performed: TOTAL KNEE ARTHROPLASTY (Right: Knee)     Patient location during evaluation: PACU Anesthesia Type: Regional and Spinal Level of consciousness: oriented and awake and alert Pain management: pain level controlled Vital Signs Assessment: post-procedure vital signs reviewed and stable Respiratory status: spontaneous breathing, respiratory function stable and patient connected to nasal cannula oxygen Cardiovascular status: blood pressure returned to baseline and stable Postop Assessment: no headache, no backache and no apparent nausea or vomiting Anesthetic complications: no  No notable events documented.  Last Vitals:  Vitals:   10/15/22 1545 10/15/22 1600  BP: 125/82 (!) 125/94  Pulse: 73 97  Resp: 14 20  Temp:  (!) 36.4 C  SpO2: 96% 98%    Last Pain:  Vitals:   10/15/22 1600  TempSrc:   PainSc: 0-No pain                 Jadrian Bulman L Kevona Lupinacci

## 2022-10-15 NOTE — Anesthesia Procedure Notes (Signed)
Spinal  Patient location during procedure: OR End time: 10/15/2022 12:59 PM Reason for block: surgical anesthesia Staffing Performed: resident/CRNA  Anesthesiologist: Elmer Picker, MD Resident/CRNA: Orest Dikes, CRNA Performed by: Orest Dikes, CRNA Authorized by: Elmer Picker, MD   Preanesthetic Checklist Completed: patient identified, IV checked, site marked, risks and benefits discussed, surgical consent, monitors and equipment checked, pre-op evaluation and timeout performed Spinal Block Patient position: sitting Prep: DuraPrep Patient monitoring: heart rate, cardiac monitor, continuous pulse ox and blood pressure Approach: midline Location: L3-4 Injection technique: single-shot Needle Needle type: Pencan  Needle gauge: 24 G Needle length: 10 cm Assessment Sensory level: T4 Events: CSF return Additional Notes IV functioning, monitors applied to pt. Expiration date of kit checked and confirmed to be in date. Sterile prep and drape, hand hygiene and sterile gloves used. Pt was positioned and spine was prepped in sterile fashion. Skin was anesthetized with lidocaine. Free flow of clear CSF obtained prior to injecting local anesthetic into CSF x 1 attempt. Spinal needle aspirated freely following injection. Needle was carefully withdrawn, and pt tolerated procedure well. Loss of motor and sensory on exam post injection. Dr Armond Hang at bedside for entire placement.

## 2022-10-15 NOTE — Op Note (Signed)
DATE OF SURGERY:  10/15/2022 TIME: 2:27 PM  PATIENT NAME:  Grace Thomas   AGE: 72 y.o.    PRE-OPERATIVE DIAGNOSIS: End-stage right knee osteoarthritis  POST-OPERATIVE DIAGNOSIS:  Same  PROCEDURE: Right total Knee Arthroplasty  SURGEON:  Amariah Kierstead A Traveon Louro, MD   ASSISTANT: Kathie Dike, PA-C, present and scrubbed throughout the case, critical for assistance with exposure, retraction, instrumentation, and closure.   OPERATIVE IMPLANTS:  Cemented Zimmer persona size 5 right femur standard, size C right tibial baseplate, 32 mm patella, 10 mm MC polyethylene insert Implant Name Type Inv. Item Serial No. Manufacturer Lot No. LRB No. Used Action  CEMENT BONE R 1X40 - [id number] Cement CEMENT BONE R 1X40  ZIMMER RECON(ORTH,TRAU,BIO,SG) [id number] Right 1 Implanted  CEMENT BONE R 1X40 - [id number] Cement CEMENT BONE R 1X40  ZIMMER RECON(ORTH,TRAU,BIO,SG) [id number] Right 1 Implanted  STEM POLY PAT PLY 53M KNEE - [id number] Knees STEM POLY PAT PLY 53M KNEE  ZIMMER RECON(ORTH,TRAU,BIO,SG) [id number] Right 1 Implanted  BSPLAT TIB 5D C 16NT STM RT - [id number] Knees BSPLAT TIB 5D C 16NT STM RT  ZIMMER RECON(ORTH,TRAU,BIO,SG) [id number] Right 1 Implanted  COMP FEM CR CEMT STD SZ5 - [id number] Joint COMP FEM CR CEMT STD SZ5  ZIMMER RECON(ORTH,TRAU,BIO,SG) [id number] Right 1 Implanted  IMPLANT ASF RT PSN 4-5/CD 10 - [id number] Joint IMPLANT ASF RT PSN 4-5/CD 10  ZIMMER RECON(ORTH,TRAU,BIO,SG) [id number] Right 1 Implanted      PREOPERATIVE INDICATIONS:  CALIEGH MIDDLEKAUFF is a 72 y.o. year old female with end stage bone on bone degenerative arthritis of the knee who failed conservative treatment, including injections, antiinflammatories, activity modification, and assistive devices, and had significant impairment of their activities of daily living, and elected for Total Knee Arthroplasty.   The risks, benefits, and alternatives were discussed at length including but not limited to the risks of  infection, bleeding, nerve injury, stiffness, blood clots, the need for revision surgery, cardiopulmonary complications, among others, and they were willing to proceed.  ESTIMATED BLOOD LOSS: 25cc  OPERATIVE DESCRIPTION:   Once adequate anesthesia was induced, preoperative antibiotics, 2 gm of ancef,1 gm of Tranexamic Acid, and 8 mg of Decadron administered, the patient was positioned supine with a right thigh tourniquet placed.  The right lower extremity was prepped and draped in sterile fashion.  A time-  out was performed identifying the patient, planned procedure, and the appropriate extremity.     The leg was  exsanguinated, tourniquet elevated to 250 mmHg.  A midline incision was  made followed by median parapatellar arthrotomy. Anterior horn of the medial meniscus was released and resected. A medial release was performed, the infrapatellar fat pad was resected with care taken to protect the patellar tendon. The suprapatellar fat was removed to exposed the distal anterior femur. The anterior horn of the lateral meniscus and ACL were released.    Following initial  exposure, I first started with the femur  The femoral  canal was opened with a drill, canal was suctioned to try to prevent fat emboli.  An  intramedullary rod was passed set at 6 degrees valgus, 10 mm. The distal femur was resected.  Following this resection, the tibia was  subluxated anteriorly.  Using the extramedullary guide, 10 mm of bone was resected off   the proximal lateral tibia.  We confirmed the gap would be  stable medially and laterally with a size 10mm spacer block as well as confirmed that the tibial cut was perpendicular in the coronal plane, checking with an  alignment rod.    Once this was done, the posterior femoral referencing femoral sizer was placed under to the posterior condyles with 3 degrees of external rotational which was parallel to the transepicondylar axis and perpendicular to [employer]. The femur was  sized to be a size 5 in the anterior-  posterior dimension. The  anterior, posterior, and  chamfer cuts were made without difficulty nor   notching making certain that I was along the anterior cortex to help  with flexion gap stability. Next a laminar spreader was placed with the knee in flexion and the medial lateral menisci were resected.  5 cc of the Exparel mixture was injected in the medial side of the back of the knee and 3 cc in the lateral side.  1/2 inch curved osteotome was used to resect posterior osteophyte that was then removed with a pituitary rongeur.       At this point, the tibia was sized to be a size C.  The size C tray was  then pinned in position. Trial reduction was now carried with a 5 femur, C tibia, a 10mm MC insert.  The knee had full extension and was stable to varus valgus stress in extension.  The knee was stable in flexion and the PCL was intact.  Attention was next directed to the patella.  Precut  measurement was noted to be 21 mm.  I resected down to 13 mm and used a  32mm patellar button to restore patellar height as well as cover the cut surface.     The patella lug holes were drilled and a 32mm patella poly trial was placed.    The knee was brought to full extension with good flexion stability with the patella tracking through the trochlea without application of pressure.     Next the femoral component was again assessed and determined to be seated and appropriately lateralized.  The femoral lug holes were drilled.  The femoral component was then removed. Tibial component was again assessed and felt to be seated and appropriately rotated with the medial third of the tubercle. The tibia was then drilled, and keel punched.     Final components were  opened and cement was mixed.      Final implants were then  cemented onto cleaned and dried cut surfaces of bone with the knee brought to extension with a 10mm MC poly.  The knee was irrigated with sterile Betadine  diluted in saline as well as pulse lavage normal saline.  The synovial lining was  then injected a dilute Exparel with 30cc of 0.25% marcaine with epinephrine.         Once the cement had fully cured, excess cement was removed throughout the knee.  I confirmed that I was satisfied with the range of motion and stability, and the final 10mm MC poly insert was chosen.  It was placed into the knee.         The tourniquet had been let down at 58 minutes.  No significant hemostasis was required.  The medial parapatellar arthrotomy was then reapproximated using #1 Stratafix sutures with the knee  in flexion.  The remaining wound was closed with 0 stratafix, 2-0 Vicryl, and running 3-0 Monocryl. The knee was cleaned, dried, dressed sterilely using Dermabond and   Aquacel dressing.  The patient was then brought to recovery room in stable condition, tolerating the procedure  well. There were no complications.   Post op recs: WB: WBAT Abx: ancef  Imaging: PACU xrays DVT prophylaxis: Aspirin 81mg  BID x4 weeks Follow up: 2 weeks after surgery for a wound check with Dr. Blanchie Dessert at [facility].  Address: [address], [place], [place] [address]  Office Phone: [phone]  Weber Cooks, MD Orthopaedic Surgery

## 2022-10-15 NOTE — Anesthesia Procedure Notes (Signed)
Anesthesia Regional Block: Adductor canal block   Pre-Anesthetic Checklist: , timeout performed,  Correct Patient, Correct Site, Correct Laterality,  Correct Procedure, Correct Position, site marked,  Risks and benefits discussed,  Pre-op evaluation,  At surgeon's request and post-op pain management  Laterality: Right  Prep: Maximum Sterile Barrier Precautions used, chloraprep       Needles:  Injection technique: Single-shot  Needle Type: Echogenic Stimulator Needle     Needle Length: 9cm  Needle Gauge: 21     Additional Needles:   Procedures:,,,, ultrasound used (permanent image in chart),,    Narrative:  Start time: 10/15/2022 11:22 AM End time: 10/15/2022 11:24 AM Injection made incrementally with aspirations every 5 mL. Anesthesiologist: Elmer Picker, MD

## 2022-10-15 NOTE — Discharge Instructions (Signed)

## 2022-10-15 NOTE — Transfer of Care (Signed)
Immediate Anesthesia Transfer of Care Note  Patient: Grace Thomas  Procedure(s) Performed: TOTAL KNEE ARTHROPLASTY (Right: Knee)  Patient Location: PACU  Anesthesia Type:MAC and Spinal  Level of Consciousness: awake, alert , and oriented  Airway & Oxygen Therapy: Patient Spontanous Breathing and Patient connected to face mask oxygen  Post-op Assessment: Report given to RN and Post -op Vital signs reviewed and stable  Post vital signs: Reviewed and stable  Last Vitals:  Vitals Value Taken Time  BP    Temp    Pulse 76 10/15/22 1503  Resp 15 10/15/22 1503  SpO2 99 % 10/15/22 1503  Vitals shown include unfiled device data.  Last Pain:  Vitals:   10/15/22 1235  TempSrc:   PainSc: 0-No pain      Patients Stated Pain Goal: 5 (10/15/22 1058)  Complications: No notable events documented.

## 2022-10-15 NOTE — Interval H&P Note (Signed)

## 2022-10-15 NOTE — Anesthesia Procedure Notes (Signed)
Procedure Name: MAC Date/Time: 10/15/2022 12:51 PM  Performed by: Orest Dikes, CRNAPre-anesthesia Checklist: Patient identified, Emergency Drugs available, Suction available and Patient being monitored Oxygen Delivery Method: Simple face mask

## 2022-10-15 NOTE — Progress Notes (Signed)
Orthopedic Tech Progress Note Patient Details:  Grace Thomas 1950-09-09 [id number]  Ortho Devices Type of Ortho Device: Bone foam zero knee Ortho Device/Splint Interventions: Ordered     Bone foam dropped off at bedside. Darleen Crocker 10/15/2022, 3:08 PM

## 2022-10-16 ENCOUNTER — Encounter (HOSPITAL_COMMUNITY): Payer: Self-pay | Admitting: Orthopedic Surgery

## 2022-10-16 DIAGNOSIS — Z79899 Other long term (current) drug therapy: Secondary | ICD-10-CM | POA: Diagnosis not present

## 2022-10-16 DIAGNOSIS — J45909 Unspecified asthma, uncomplicated: Secondary | ICD-10-CM | POA: Diagnosis not present

## 2022-10-16 DIAGNOSIS — M1711 Unilateral primary osteoarthritis, right knee: Secondary | ICD-10-CM | POA: Diagnosis not present

## 2022-10-16 DIAGNOSIS — Z7982 Long term (current) use of aspirin: Secondary | ICD-10-CM | POA: Diagnosis not present

## 2022-10-16 DIAGNOSIS — Z96611 Presence of right artificial shoulder joint: Secondary | ICD-10-CM | POA: Diagnosis not present

## 2022-10-16 DIAGNOSIS — Z8616 Personal history of COVID-19: Secondary | ICD-10-CM | POA: Diagnosis not present

## 2022-10-16 LAB — CBC
HCT: 36.1 % (ref 36.0–46.0)
Hemoglobin: 11.6 g/dL — ABNORMAL LOW (ref 12.0–15.0)
MCH: 31.9 pg (ref 26.0–34.0)
MCHC: 32.1 g/dL (ref 30.0–36.0)
MCV: 99.2 fL (ref 80.0–100.0)
Platelets: 207 10*3/uL (ref 150–400)
RBC: 3.64 MIL/uL — ABNORMAL LOW (ref 3.87–5.11)
RDW: 11.9 % (ref 11.5–15.5)
WBC: 13.8 10*3/uL — ABNORMAL HIGH (ref 4.0–10.5)
nRBC: 0 % (ref 0.0–0.2)

## 2022-10-16 LAB — BASIC METABOLIC PANEL
Anion gap: 11 (ref 5–15)
BUN: 9 mg/dL (ref 8–23)
CO2: 24 mmol/L (ref 22–32)
Calcium: 8.4 mg/dL — ABNORMAL LOW (ref 8.9–10.3)
Chloride: 102 mmol/L (ref 98–111)
Creatinine, Ser: 0.71 mg/dL (ref 0.44–1.00)
GFR, Estimated: >60 mL/min (ref >60)
Glucose, Bld: 147 mg/dL — ABNORMAL HIGH (ref 70–99)
Potassium: 3.9 mmol/L (ref 3.5–5.1)
Sodium: 137 mmol/L (ref 135–145)

## 2022-10-16 NOTE — Care Management Obs Status (Signed)
MEDICARE OBSERVATION STATUS NOTIFICATION   Patient Details  Name: Grace Thomas MRN: [id number] Date of Birth: 14-Sep-1950   Medicare Observation Status Notification Given:  Yes    Ewing Schlein, LCSW 10/16/2022, 2:59 PM

## 2022-10-16 NOTE — Progress Notes (Signed)
Physical Therapy Treatment Patient Details Name: Grace Thomas MRN: [id number] DOB: 12/28/1950 Today's Date: 10/16/2022   History of Present Illness Grace Thomas is a 72 yo female s/p R TKA 10/15/22. PMH: back pain, GERD, heart murmur, s/p TAVR 2022    PT Comments  Pt with completes transfers and ambulation with RW, supv for safety and initial verbal cues for hand placement with good carryover. Completed stair training with pt, pt's spouse and pt's sister; pt able to ascend/descend 4 steps with single handrail and supv for safety. All questions answered and education complete. Pt ready to d/c home with family support.   If plan is discharge home, recommend the following: Assistance with cooking/housework;Assist for transportation;Help with stairs or ramp for entrance   Can travel by private vehicle        Equipment Recommendations  None recommended by PT (getting RW this admission)    Recommendations for Other Services       Precautions / Restrictions Precautions Precautions: Fall Restrictions Weight Bearing Restrictions: No     Mobility  Bed Mobility General bed mobility comments: in recliner upon arrival    Transfers Overall transfer level: Needs assistance Equipment used: Rolling walker (2 wheels) Transfers: Sit to/from Stand Sit to Stand: Supervision           General transfer comment: supv from recliner chair, verbal cues for hand placement    Ambulation/Gait Ambulation/Gait assistance: Supervision Gait Distance (Feet): 80 Feet Assistive device: Rolling walker (2 wheels) Gait Pattern/deviations: Step-to pattern, Decreased stride length, Step-through pattern Gait velocity: decreased     General Gait Details: step-to progressing to step through pattern, no knee buckling or unsteadiness noted, good body positioning within RW frame, able to complete turns without LOB   Stairs Stairs: Yes Stairs assistance: Supervision Stair Management: One rail  Right Number of Stairs: 4 General stair comments: pt ascends/descends 2 steps with therapist, demo good sequencing using single R handrail mimicking home setup; pt able to perform with spouse providing supv to ascend/descend 2 steps and single R handrail, no overt LOB or unsteadiness noted   Wheelchair Mobility     Tilt Bed    Modified Rankin (Stroke Patients Only)       Balance Overall balance assessment: Needs assistance Sitting-balance support: Feet supported Sitting balance-Leahy Scale: Good     Standing balance support: Reliant on assistive device for balance, During functional activity, Bilateral upper extremity supported Standing balance-Leahy Scale: Poor                              Cognition Arousal: Alert Behavior During Therapy: WFL for tasks assessed/performed Overall Cognitive Status: Within Functional Limits for tasks assessed                                          Exercises     General Comments        Pertinent Vitals/Pain Pain Assessment Pain Assessment: 0-10 Pain Score: 5  Pain Location: R knee Pain Descriptors / Indicators: Sore Pain Intervention(s): Limited activity within patient's tolerance, Premedicated before session, Repositioned, Monitored during session    Home Living Family/patient expects to be discharged to:: Private residence Living Arrangements: Spouse/significant other Available Help at Discharge: Family;Available 24 hours/day Type of Home: Mobile home Home Access: Stairs to enter Entrance Stairs-Rails: Right Entrance Stairs-Number of Steps: 2  Home Layout: One level Home Equipment: Shower seat      Prior Function            PT Goals (current goals can now be found in the care plan section) Acute Rehab PT Goals Patient Stated Goal: return home, regain independence PT Goal Formulation: With patient Time For Goal Achievement: 10/30/22 Potential to Achieve Goals: Good Progress towards PT  goals: Progressing toward goals    Frequency    7X/week      PT Plan      Co-evaluation              AM-PAC PT "6 Clicks" Mobility   Outcome Measure  Help needed turning from your back to your side while in a flat bed without using bedrails?: A Little Help needed moving from lying on your back to sitting on the side of a flat bed without using bedrails?: A Little Help needed moving to and from a bed to a chair (including a wheelchair)?: A Little Help needed standing up from a chair using your arms (e.g., wheelchair or bedside chair)?: A Little Help needed to walk in hospital room?: A Little Help needed climbing 3-5 steps with a railing? : A Little 6 Click Score: 18    End of Session Equipment Utilized During Treatment: Gait belt Activity Tolerance: Patient tolerated treatment well Patient left: in chair;with call bell/phone within reach;with chair alarm set;with family/visitor present Nurse Communication: Mobility status PT Visit Diagnosis: Other abnormalities of gait and mobility (R26.89);Muscle weakness (generalized) (M62.81);Pain Pain - Right/Left: Right Pain - part of body: Knee     Time: 8119-1478 PT Time Calculation (min) (ACUTE ONLY): 27 min  Charges:    $Gait Training: 8-22 mins $Therapeutic Activity: 8-22 mins PT General Charges $$ ACUTE PT VISIT: 1 Visit                     Tori Herma Uballe PT, DPT 10/16/22, 2:49 PM

## 2022-10-16 NOTE — Evaluation (Signed)
Physical Therapy Evaluation Patient Details Name: Grace Thomas MRN: [id number] DOB: 12/19/1950 Today's Date: 10/16/2022  History of Present Illness  Grace Thomas is a 72 yo female s/p R TKA 10/15/22. PMH: back pain, GERD, heart murmur, s/p TAVR 2022  Clinical Impression  Pt is s/p R TKA resulting in the deficits listed below (see PT Problem List). Pt from home with spouse, independent at baseline, not using DME, denies falls. Pt currently needing min A to power up and for ambulation with RW, slightly unsteady with LOB needing assist to prevent LOB, educated on RW Management and positioning. Pt reports attending OPPT prior to surgery, able to demo good muscle activation and knowledge with exercises. Pt reports 5/10 pain in knee, needing therapeutic rest break to recover. Plan to progress mobility as able and complete stair training prior to return home with spouse support. Pt reports she has 6 HHPT visits then starts OPPT 10/31/22. Pt will benefit from acute skilled PT to increase their independence and safety with mobility to allow discharge.          If plan is discharge home, recommend the following: A little help with walking and/or transfers;A little help with bathing/dressing/bathroom;Assistance with cooking/housework;Assist for transportation;Help with stairs or ramp for entrance   Can travel by private vehicle        Equipment Recommendations None recommended by PT (getting RW this admission)  Recommendations for Other Services       Functional Status Assessment Patient has had a recent decline in their functional status and demonstrates the ability to make significant improvements in function in a reasonable and predictable amount of time.     Precautions / Restrictions Precautions Precautions: Fall Restrictions Weight Bearing Restrictions: No      Mobility  Bed Mobility Overal bed mobility: Needs Assistance Bed Mobility: Supine to Sit     Supine to sit: Contact  guard     General bed mobility comments: increased time and effort to mobilize to EOB for sitting, no physical assist    Transfers Overall transfer level: Needs assistance Equipment used: Rolling walker (2 wheels) Transfers: Sit to/from Stand Sit to Stand: Min assist           General transfer comment: min A to steady and power up to stand, strong BUE assist, verbal cues for RLE positioning for comfort to power up and for controlled descent    Ambulation/Gait Ambulation/Gait assistance: Min assist, Contact guard assist Gait Distance (Feet): 100 Feet Assistive device: Rolling walker (2 wheels) Gait Pattern/deviations: Step-to pattern, Decreased stride length, Decreased stance time - right, Decreased weight shift to right Gait velocity: decreased     General Gait Details: step-to gait pattern with decreased RLE stance time and weight-bearing, verbal cues for positioning within RW frame, pt too close to RW with minor LOB needing min A to recover and prevent fall  Stairs            Wheelchair Mobility     Tilt Bed    Modified Rankin (Stroke Patients Only)       Balance Overall balance assessment: Needs assistance Sitting-balance support: Feet supported Sitting balance-Leahy Scale: Good     Standing balance support: Reliant on assistive device for balance, During functional activity, Bilateral upper extremity supported Standing balance-Leahy Scale: Poor                               Pertinent Vitals/Pain Pain Assessment Pain  Assessment: 0-10 Pain Score: 5  Pain Location: R knee Pain Descriptors / Indicators: Sore Pain Intervention(s): Limited activity within patient's tolerance, Premedicated before session, Monitored during session, Repositioned, Ice applied    Home Living Family/patient expects to be discharged to:: Private residence Living Arrangements: Spouse/significant other Available Help at Discharge: Family;Available 24  hours/day Type of Home: Mobile home Home Access: Stairs to enter Entrance Stairs-Rails: Right Entrance Stairs-Number of Steps: 2   Home Layout: One level Home Equipment: Shower seat      Prior Function Prior Level of Function : Independent/Modified Independent;Driving             Mobility Comments: pt reports ind with community amb, no AD, denies falls ADLs Comments: pt reports ind with ADLs/IADLs     Extremity/Trunk Assessment   Upper Extremity Assessment Upper Extremity Assessment: Overall WFL for tasks assessed    Lower Extremity Assessment Lower Extremity Assessment: RLE deficits/detail RLE Deficits / Details: ankle WFL, good quad set, performs SLR ~4 inches without extensor lag, hip abduction in supine and LAQ in sitting RLE Sensation: WNL RLE Coordination: WNL    Cervical / Trunk Assessment Cervical / Trunk Assessment: Normal  Communication   Communication Communication: No apparent difficulties  Cognition Arousal: Alert Behavior During Therapy: WFL for tasks assessed/performed Overall Cognitive Status: Within Functional Limits for tasks assessed                                          General Comments      Exercises Total Joint Exercises Ankle Circles/Pumps: Supine, Right Quad Sets: Supine, Right Heel Slides: Supine, Right Hip ABduction/ADduction: Supine, Right Straight Leg Raises: Supine, Right Long Arc Quad: Supine, Right   Assessment/Plan    PT Assessment Patient needs continued PT services  PT Problem List Decreased strength;Decreased range of motion;Decreased activity tolerance;Decreased balance;Decreased mobility;Decreased knowledge of use of DME;Decreased safety awareness;Pain       PT Treatment Interventions DME instruction;Gait training;Stair training;Functional mobility training;Therapeutic activities;Therapeutic exercise;Balance training;Patient/family education;Modalities    PT Goals (Current goals can be found in  the Care Plan section)  Acute Rehab PT Goals Patient Stated Goal: return home, regain independence PT Goal Formulation: With patient Time For Goal Achievement: 10/30/22 Potential to Achieve Goals: Good    Frequency 7X/week     Co-evaluation               AM-PAC PT "6 Clicks" Mobility  Outcome Measure Help needed turning from your back to your side while in a flat bed without using bedrails?: A Little Help needed moving from lying on your back to sitting on the side of a flat bed without using bedrails?: A Little Help needed moving to and from a bed to a chair (including a wheelchair)?: A Little Help needed standing up from a chair using your arms (e.g., wheelchair or bedside chair)?: A Little Help needed to walk in hospital room?: A Little Help needed climbing 3-5 steps with a railing? : A Lot 6 Click Score: 17    End of Session Equipment Utilized During Treatment: Gait belt Activity Tolerance: Patient tolerated treatment well Patient left: in chair;with call bell/phone within reach;with chair alarm set Nurse Communication: Mobility status PT Visit Diagnosis: Other abnormalities of gait and mobility (R26.89);Muscle weakness (generalized) (M62.81);Pain Pain - Right/Left: Right Pain - part of body: Knee    Time: 0916-1006 PT Time Calculation (min) (ACUTE ONLY):  50 min   Charges:   PT Evaluation $PT Eval Low Complexity: 1 Low PT Treatments $Gait Training: 8-22 mins $Therapeutic Exercise: 8-22 mins PT General Charges $$ ACUTE PT VISIT: 1 Visit         Tori Zareya Tuckett PT, DPT 10/16/22, 12:30 PM

## 2022-10-16 NOTE — TOC Transition Note (Signed)
Transition of Care [facility]) - CM/SW Discharge Note  Patient Details  Name: Grace Thomas MRN: [id number] Date of Birth: 10/20/1950  Transition of Care [facility]) CM/SW Contact:  Ewing Schlein, LCSW Phone Number: 10/16/2022, 11:06 AM  Clinical Narrative: Patient is expected to discharge home after working with PT. CSW met with patient to confirm discharge plan and needs. Patient will go home with HHPT, which was prearranged with [facility]. Patient will need a rolling walker, which [employer] delivered to patient's room. TOC signing off.    Final next level of care: Home w Home Health Services Barriers to Discharge: No Barriers Identified  Patient Goals and CMS Choice CMS Medicare.gov Compare Post Acute Care list provided to:: Patient Choice offered to / list presented to : Patient  Discharge Plan and Services Additional resources added to the After Visit Summary for          DME Arranged: Walker rolling DME Agency: [employer] Representative spoke with at DME Agency: Prearranged in orthopedist's office HH Arranged: PT HH Agency: [facility] Representative spoke with at [facility] Agency: Prearranged in orthopedist's office  Social Determinants of Health (SDOH) Interventions SDOH Screenings   Food Insecurity: No Food Insecurity (10/15/2022)  Housing: Low Risk  (10/15/2022)  Transportation Needs: No Transportation Needs (10/15/2022)  Utilities: Not At Risk (10/15/2022)  Tobacco Use: Low Risk  (10/15/2022)   Readmission Risk Interventions     No data to display

## 2022-10-16 NOTE — Progress Notes (Signed)
     Subjective:  Patient reports pain as mild.  No issues overnight. Eager to work with PT today. Hopeful for discharge home.  Objective:   VITALS:   Vitals:   10/15/22 2036 10/15/22 2313 10/16/22 0146 10/16/22 0542  BP: 124/76 (!) 134/107 126/67 127/60  Pulse: 93 92 82 78  Resp: 16 15 15 15   Temp: 98.3 F (36.8 C) 98.2 F (36.8 C) 97.8 F (36.6 C) 98.1 F (36.7 C)  TempSrc: Oral  Oral Oral  SpO2: 96% 94% 95% 96%  Weight:      Height:        Sensation intact distally Intact pulses distally Dorsiflexion/Plantar flexion intact Incision: dressing C/D/I Compartment soft    Lab Results  Component Value Date   WBC 13.8 (H) 10/16/2022   HGB 11.6 (L) 10/16/2022   HCT 36.1 10/16/2022   MCV 99.2 10/16/2022   PLT 207 10/16/2022   BMET    Component Value Date/Time   NA 137 10/16/2022 0343   NA 147 (H) 08/31/2020 1352   K 3.9 10/16/2022 0343   CL 102 10/16/2022 0343   CO2 24 10/16/2022 0343   GLUCOSE 147 (H) 10/16/2022 0343   BUN 9 10/16/2022 0343   BUN 9 08/31/2020 1352   CREATININE 0.71 10/16/2022 0343   CALCIUM 8.4 (L) 10/16/2022 0343   EGFR 95 08/31/2020 1352   GFRNONAA >60 10/16/2022 0343    Xray: TKA componetns in good position no adverse features  Assessment/Plan: 1 Day Post-Op   Principal Problem:   Primary osteoarthritis of right knee  S/p R TKA 10/15/22  Post op recs: WB: WBAT Abx: ancef Imaging: PACU xrays DVT prophylaxis: Aspirin 81mg  BID x4 weeks Follow up: 2 weeks after surgery for a wound check with Dr. Blanchie Dessert at [facility].  Address: [address], [place], [place] [address]  Office Phone: [phone]   Joen Laura 10/16/2022, 6:51 AM   Weber Cooks, MD  Contact information:   [phone] 7am-5pm epic message Dr. Blanchie Dessert, or call office for patient follow up: [phone] After hours and holidays please check [email or web address] for group call information for [facility]

## 2022-10-16 NOTE — Discharge Summary (Signed)
Physician Discharge Summary  Patient ID: Grace Thomas MRN: [id number] DOB/AGE: 73-26-1952 73 y.o.  Admit date: 10/15/2022 Discharge date: 10/16/2022  Admission Diagnoses:  Primary osteoarthritis of right knee  Discharge Diagnoses:  Principal Problem:   Primary osteoarthritis of right knee   Past Medical History:  Diagnosis Date   Anxiety    Aortic stenosis 11/01/2020   TAVR   Arthritis    Asthma due to seasonal allergies    Pt.denies 12/18/21   Back pain    COVID-26 July 2020   Depression    Family history of adverse reaction to anesthesia    Daughter has PONV   GERD (gastroesophageal reflux disease)    " a little"   Heart murmur    Knee pain    had gel shots for bilateral 12/05/21 was last injection   Recurrent UTI    S/P TAVR (transcatheter aortic valve replacement) 11/01/2020   Edwards 23mm S3U TF approach with Dr. Edison Pace    Surgeries: Procedure(s): TOTAL KNEE ARTHROPLASTY on 10/15/2022   Consultants (if any):   Discharged Condition: Improved  Hospital Course: Grace Thomas is an 72 y.o. female who was admitted 10/15/2022 with a diagnosis of Primary osteoarthritis of right knee and went to the operating room on 10/15/2022 and underwent the above named procedures.    She was given perioperative antibiotics:  Anti-infectives (From admission, onward)    Start     Dose/Rate Route Frequency Ordered Stop   10/15/22 1925  ceFAZolin (ANCEF) 2-4 GM/100ML-% IVPB       Note to Pharmacy: Moishe Spice W: cabinet override      10/15/22 1925 10/16/22 0729   10/15/22 1830  ceFAZolin (ANCEF) IVPB 2g/100 mL premix        2 g 200 mL/hr over 30 Minutes Intravenous Every 6 hours 10/15/22 1824 10/16/22 0110   10/15/22 1015  ceFAZolin (ANCEF) IVPB 2g/100 mL premix        2 g 200 mL/hr over 30 Minutes Intravenous On call to O.R. 10/15/22 1010 10/15/22 1300     .  She was given sequential compression devices, early ambulation, and aspirin for DVT  prophylaxis.  She benefited maximally from the hospital stay and there were no complications.    Recent vital signs:  Vitals:   10/16/22 0146 10/16/22 0542  BP: 126/67 127/60  Pulse: 82 78  Resp: 15 15  Temp: 97.8 F (36.6 C) 98.1 F (36.7 C)  SpO2: 95% 96%    Recent laboratory studies:  Lab Results  Component Value Date   HGB 11.6 (L) 10/16/2022   HGB 13.0 10/05/2022   HGB 13.4 02/23/2021   Lab Results  Component Value Date   WBC 13.8 (H) 10/16/2022   PLT 207 10/16/2022   Lab Results  Component Value Date   INR 1.1 02/28/2021   Lab Results  Component Value Date   NA 137 10/16/2022   K 3.9 10/16/2022   CL 102 10/16/2022   CO2 24 10/16/2022   BUN 9 10/16/2022   CREATININE 0.71 10/16/2022   GLUCOSE 147 (H) 10/16/2022    Discharge Medications:   Allergies as of 10/16/2022   No Known Allergies      Medication List     TAKE these medications    acetaminophen 500 MG tablet Commonly known as: TYLENOL Take 2 tablets (1,000 mg total) by mouth every 8 (eight) hours as needed. What changed:  how much to take when to take this reasons to  take this   ALPRAZolam 0.25 MG tablet Commonly known as: XANAX Take 0.125 mg by mouth daily as needed for anxiety (anxiety/nerves).   amoxicillin 500 MG capsule Commonly known as: AMOXIL Take 2,000 mg by mouth See admin instructions. Take 2000 mg 1 hour prior to dental work   aspirin EC 81 MG tablet Take 1 tablet (81 mg total) by mouth 2 (two) times daily for 28 days. Swallow whole. What changed: when to take this   celecoxib 100 MG capsule Commonly known as: CeleBREX Take 1 capsule (100 mg total) by mouth 2 (two) times daily for 14 days. What changed:  when to take this reasons to take this   fexofenadine 180 MG tablet Commonly known as: ALLEGRA Take 180 mg by mouth daily as needed for allergies or rhinitis.   fluconazole 150 MG tablet Commonly known as: DIFLUCAN Take 150 mg by mouth daily as needed  (yeast).   hydrocortisone 25 MG suppository Commonly known as: ANUSOL-HC Place 25 mg rectally daily.   methocarbamol 500 MG tablet Commonly known as: ROBAXIN Take 1 tablet (500 mg total) by mouth every 8 (eight) hours as needed for up to 10 days for muscle spasms.   miconazole 2 % vaginal cream Commonly known as: MONISTAT 7 Place 1 Applicatorful vaginally at bedtime as needed (yeast infection).   ondansetron 4 MG tablet Commonly known as: Zofran Take 1 tablet (4 mg total) by mouth every 8 (eight) hours as needed for up to 14 days for nausea or vomiting.   oxyCODONE 5 MG immediate release tablet Commonly known as: Roxicodone Take 1 tablet (5 mg total) by mouth every 4 (four) hours as needed for up to 7 days for severe pain or moderate pain.   polyethylene glycol 17 g packet Commonly known as: MiraLax Take 17 g by mouth daily.   triamcinolone 55 MCG/ACT Aero nasal inhaler Commonly known as: NASACORT Place 2 sprays into the nose daily as needed (allergies.).   vitamin B-12 500 MCG tablet Commonly known as: CYANOCOBALAMIN Take 500 mcg by mouth daily.   Vitamin D3 50 MCG (2000 UT) Tabs Take 2,000 Units by mouth every evening.   Zinc 50 MG Tabs Take 50 mg by mouth every evening.        Diagnostic Studies: DG Knee Right Port  Result Date: 10/15/2022 CLINICAL DATA:  Postop knee replacement. EXAM: PORTABLE RIGHT KNEE - 1-2 VIEW COMPARISON:  None Available. FINDINGS: Left knee arthroplasty in expected alignment. No periprosthetic lucency or fracture. There has been patellar resurfacing. Recent postsurgical change includes air and edema in the soft tissues and joint space. IMPRESSION: Left knee arthroplasty without immediate postoperative complication. Electronically Signed   By: Narda Rutherford M.D.   On: 10/15/2022 15:39    Disposition: Discharge disposition: 01-Home or Self Care       Discharge Instructions     Call MD / Call 911   Complete by: As directed    If  you experience chest pain or shortness of breath, CALL 911 and be transported to the hospital emergency room.  If you develope a fever above 101 F, pus (white drainage) or increased drainage or redness at the wound, or calf pain, call your surgeon's office.   Constipation Prevention   Complete by: As directed    Drink plenty of fluids.  Prune juice may be helpful.  You may use a stool softener, such as Colace (over the counter) 100 mg twice a day.  Use MiraLax (over the counter) for  constipation as needed.   Diet - low sodium heart healthy   Complete by: As directed    Increase activity slowly as tolerated   Complete by: As directed    Post-operative opioid taper instructions:   Complete by: As directed    POST-OPERATIVE OPIOID TAPER INSTRUCTIONS: It is important to wean off of your opioid medication as soon as possible. If you do not need pain medication after your surgery it is ok to stop day one. Opioids include: Codeine, Hydrocodone(Norco, Vicodin), Oxycodone(Percocet, oxycontin) and hydromorphone amongst others.  Long term and even short term use of opiods can cause: Increased pain response Dependence Constipation Depression Respiratory depression And more.  Withdrawal symptoms can include Flu like symptoms Nausea, vomiting And more Techniques to manage these symptoms Hydrate well Eat regular healthy meals Stay active Use relaxation techniques(deep breathing, meditating, yoga) Do Not substitute Alcohol to help with tapering If you have been on opioids for less than two weeks and do not have pain than it is ok to stop all together.  Plan to wean off of opioids This plan should start within one week post op of your joint replacement. Maintain the same interval or time between taking each dose and first decrease the dose.  Cut the total daily intake of opioids by one tablet each day Next start to increase the time between doses. The last dose that should be eliminated is the  evening dose.           Follow-up Information     Joen Laura, MD. Go on 10/30/2022.   Specialty: Orthopedic Surgery Why: your appointemnt is scheduled for 2:30 Contact information: [address] [place] [address] [phone]         [facility] Follow up.   Why: [facility] will provide 6 home PT visits prior to starting outpatient physical therapy        [facility] Go on 10/31/2022.   Specialty: Physical Therapy Why: Your outpatient physical therapy has been ordered. They will call you with a time Contact information: [facility] [address] [place] [address] [phone]                    Discharge Instructions      INSTRUCTIONS AFTER JOINT REPLACEMENT   Remove items at home which could result in a fall. This includes throw rugs or furniture in walking pathways ICE to the affected joint every three hours while awake for 30 minutes at a time, for at least the first 3-5 days, and then as needed for pain and swelling.  Continue to use ice for pain and swelling. You may notice swelling that will progress down to the foot and ankle.  This is normal after surgery.  Elevate your leg when you are not up walking on it.   Continue to use the breathing machine you got in the hospital (incentive spirometer) which will help keep your temperature down.  It is common for your temperature to cycle up and down following surgery, especially at night when you are not up moving around and exerting yourself.  The breathing machine keeps your lungs expanded and your temperature down.  DIET:  As you were doing prior to hospitalization, we recommend a well-balanced diet.  DRESSING / WOUND CARE / SHOWERING:  Keep the surgical dressing until follow up.  The dressing is water proof, so you can shower without  any extra covering.  IF THE DRESSING FALLS OFF or the wound gets wet inside,  change the dressing with sterile gauze.  Please use good hand washing techniques before changing the dressing.  Do not use any lotions or creams on the incision until instructed by your surgeon.    ACTIVITY  Increase activity slowly as tolerated, but follow the weight bearing instructions below.   No driving for 6 weeks or until further direction given by your physician.  You cannot drive while taking narcotics.  No lifting or carrying greater than 10 lbs. until further directed by your surgeon. Avoid periods of inactivity such as sitting longer than an hour when not asleep. This helps prevent blood clots.  You may return to work once you are authorized by your doctor.   WEIGHT BEARING: Weight bearing as tolerated with assist device (walker, cane, etc) as directed, use it as long as suggested by your surgeon or therapist, typically at least 4-6 weeks.  EXERCISES  Results after joint replacement surgery are often greatly improved when you follow the exercise, range of motion and muscle strengthening exercises prescribed by your doctor. Safety measures are also important to protect the joint from further injury. Any time any of these exercises cause you to have increased pain or swelling, decrease what you are doing until you are comfortable again and then slowly increase them. If you have problems or questions, call your caregiver or physical therapist for advice.   Rehabilitation is important following a joint replacement. After just a few days of immobilization, the muscles of the leg can become weakened and shrink (atrophy).  These exercises are designed to build up the tone and strength of the thigh and leg muscles and to improve motion. Often times heat used for twenty to thirty minutes before working out will loosen up your tissues and help with improving the range of motion but do not use heat for the first two weeks following surgery (sometimes heat can increase post-operative swelling).    These exercises can be done on a training (exercise) mat, on the floor, on a table or on a bed. Use whatever works the best and is most comfortable for you.    Use music or television while you are exercising so that the exercises are a pleasant break in your day. This will make your life better with the exercises acting as a break in your routine that you can look forward to.   Perform all exercises about fifteen times, three times per day or as directed.  You should exercise both the operative leg and the other leg as well.  Exercises include:   Quad Sets - Tighten up the muscle on the front of the thigh (Quad) and hold for 5-10 seconds.   Straight Leg Raises - With your knee straight (if you were given a brace, keep it on), lift the leg to 60 degrees, hold for 3 seconds, and slowly lower the leg.  Perform this exercise against resistance later as your leg gets stronger.  Leg Slides: Lying on your back, slowly slide your foot toward your buttocks, bending your knee up off the floor (only go as far as is comfortable). Then slowly slide your foot back down until your leg is flat on the floor again.  Angel Wings: Lying on your back spread your legs to the side as far apart as you can without causing discomfort.  Hamstring Strength:  Lying on your back, push your heel against the floor with  your leg straight by tightening up the muscles of your buttocks.  Repeat, but this time bend your knee to a comfortable angle, and push your heel against the floor.  You may put a pillow under the heel to make it more comfortable if necessary.   A rehabilitation program following joint replacement surgery can speed recovery and prevent re-injury in the future due to weakened muscles. Contact your doctor or a physical therapist for more information on knee rehabilitation.   CONSTIPATION:  Constipation is defined medically as fewer than three stools per week and severe constipation as less than one stool per week.   Even if you have a regular bowel pattern at home, your normal regimen is likely to be disrupted due to multiple reasons following surgery.  Combination of anesthesia, postoperative narcotics, change in appetite and fluid intake all can affect your bowels.   YOU MUST use at least one of the following options; they are listed in order of increasing strength to get the job done.  They are all available over the counter, and you may need to use some, POSSIBLY even all of these options:    Drink plenty of fluids (prune juice may be helpful) and high fiber foods Colace 100 mg by mouth twice a day  Senokot for constipation as directed and as needed Dulcolax (bisacodyl), take with full glass of water  Miralax (polyethylene glycol) once or twice a day as needed.  If you have tried all these things and are unable to have a bowel movement in the first 3-4 days after surgery call either your surgeon or your primary doctor.    If you experience loose stools or diarrhea, hold the medications until you stool forms back up.  If your symptoms do not get better within 1 week or if they get worse, check with your doctor.  If you experience "the worst abdominal pain ever" or develop nausea or vomiting, please contact the office immediately for further recommendations for treatment.  ITCHING:  If you experience itching with your medications, try taking only a single pain pill, or even half a pain pill at a time.  You can also use Benadryl over the counter for itching or also to help with sleep.   TED HOSE STOCKINGS:  Use stockings on both legs until for at least 2 weeks or as directed by physician office. They may be removed at night for sleeping.  MEDICATIONS:  See your medication summary on the "After Visit Summary" that nursing will review with you.  You may have some home medications which will be placed on hold until you complete the course of blood thinner medication.  It is important for you to complete the blood  thinner medication as prescribed.  Blood clot prevention (DVT Prophylaxis): After surgery you are at an increased risk for a blood clot. you were prescribed a blood thinner, Aspirin 81mg , to be taken twice daily for a total of 4 weeks from surgery to help reduce your risk of getting a blood clot.  Signs of a pulmonary embolus (blood clot in the lungs) include sudden short of breath, feeling lightheaded or dizzy, chest pain with a deep breath, rapid pulse rapid breathing.  Signs of a blood clot in your arms or legs include new unexplained swelling and cramping, warm, red or darkened skin around the painful area.  Please call the office or 911 right away if these signs or symptoms develop.  PRECAUTIONS:   If you experience chest pain or  shortness of breath - call 911 immediately for transfer to the hospital emergency department.   If you develop a fever greater that 101 F, purulent drainage from wound, increased redness or drainage from wound, foul odor from the wound/dressing, or calf pain - CONTACT YOUR SURGEON.                                                   FOLLOW-UP APPOINTMENTS:  If you do not already have a post-op appointment, please call the office for an appointment to be seen by your surgeon.  Guidelines for how soon to be seen are listed in your "After Visit Summary", but are typically between 2-3 weeks after surgery.  If you have a specialized bandage, you may be told to follow up 1 week after surgery.  OTHER INSTRUCTIONS:  Knee Replacement:  Do not place pillow under knee, focus on keeping the knee straight while resting.  Place foam block, curve side up under heel at all times except when walking.  DO NOT modify, tear, cut, or change the foam block in any way.  POST-OPERATIVE OPIOID TAPER INSTRUCTIONS: It is important to wean off of your opioid medication as soon as possible. If you do not need pain medication after your surgery it is ok to stop day one. Opioids include: Codeine,  Hydrocodone(Norco, Vicodin), Oxycodone(Percocet, oxycontin) and hydromorphone amongst others.  Long term and even short term use of opiods can cause: Increased pain response Dependence Constipation Depression Respiratory depression And more.  Withdrawal symptoms can include Flu like symptoms Nausea, vomiting And more Techniques to manage these symptoms Hydrate well Eat regular healthy meals Stay active Use relaxation techniques(deep breathing, meditating, yoga) Do Not substitute Alcohol to help with tapering If you have been on opioids for less than two weeks and do not have pain than it is ok to stop all together.  Plan to wean off of opioids This plan should start within one week post op of your joint replacement. Maintain the same interval or time between taking each dose and first decrease the dose.  Cut the total daily intake of opioids by one tablet each day Next start to increase the time between doses. The last dose that should be eliminated is the evening dose.   MAKE SURE YOU:  Understand these instructions.  Get help right away if you are not doing well or get worse.    Thank you for letting us be a part of your medical care team.  It is a privilege we respect greatly.  We hope these instructions will help you stay on track for a fast and full recovery!            Signed: Jobeth Pangilinan A Tahsin Benyo 10/16/2022, 6:55 AM

## 2022-10-17 DIAGNOSIS — Z96651 Presence of right artificial knee joint: Secondary | ICD-10-CM | POA: Diagnosis not present

## 2022-10-17 DIAGNOSIS — F32A Depression, unspecified: Secondary | ICD-10-CM | POA: Diagnosis not present

## 2022-10-17 DIAGNOSIS — Z471 Aftercare following joint replacement surgery: Secondary | ICD-10-CM | POA: Diagnosis not present

## 2022-10-17 DIAGNOSIS — K219 Gastro-esophageal reflux disease without esophagitis: Secondary | ICD-10-CM | POA: Diagnosis not present

## 2022-10-17 DIAGNOSIS — J45909 Unspecified asthma, uncomplicated: Secondary | ICD-10-CM | POA: Diagnosis not present

## 2022-10-17 DIAGNOSIS — F419 Anxiety disorder, unspecified: Secondary | ICD-10-CM | POA: Diagnosis not present

## 2022-10-17 DIAGNOSIS — Z7952 Long term (current) use of systemic steroids: Secondary | ICD-10-CM | POA: Diagnosis not present

## 2022-10-17 DIAGNOSIS — Z7982 Long term (current) use of aspirin: Secondary | ICD-10-CM | POA: Diagnosis not present

## 2022-10-17 DIAGNOSIS — Z79899 Other long term (current) drug therapy: Secondary | ICD-10-CM | POA: Diagnosis not present

## 2022-10-17 DIAGNOSIS — Z87440 Personal history of urinary (tract) infections: Secondary | ICD-10-CM | POA: Diagnosis not present

## 2022-10-17 DIAGNOSIS — M5412 Radiculopathy, cervical region: Secondary | ICD-10-CM | POA: Diagnosis not present

## 2022-10-24 DIAGNOSIS — F32A Depression, unspecified: Secondary | ICD-10-CM | POA: Diagnosis not present

## 2022-10-24 DIAGNOSIS — K219 Gastro-esophageal reflux disease without esophagitis: Secondary | ICD-10-CM | POA: Diagnosis not present

## 2022-10-24 DIAGNOSIS — J45909 Unspecified asthma, uncomplicated: Secondary | ICD-10-CM | POA: Diagnosis not present

## 2022-10-24 DIAGNOSIS — Z96651 Presence of right artificial knee joint: Secondary | ICD-10-CM | POA: Diagnosis not present

## 2022-10-24 DIAGNOSIS — F419 Anxiety disorder, unspecified: Secondary | ICD-10-CM | POA: Diagnosis not present

## 2022-10-24 DIAGNOSIS — Z471 Aftercare following joint replacement surgery: Secondary | ICD-10-CM | POA: Diagnosis not present

## 2022-10-26 DIAGNOSIS — Z471 Aftercare following joint replacement surgery: Secondary | ICD-10-CM | POA: Diagnosis not present

## 2022-10-26 DIAGNOSIS — J45909 Unspecified asthma, uncomplicated: Secondary | ICD-10-CM | POA: Diagnosis not present

## 2022-10-26 DIAGNOSIS — Z96651 Presence of right artificial knee joint: Secondary | ICD-10-CM | POA: Diagnosis not present

## 2022-10-26 DIAGNOSIS — K219 Gastro-esophageal reflux disease without esophagitis: Secondary | ICD-10-CM | POA: Diagnosis not present

## 2022-10-26 DIAGNOSIS — F32A Depression, unspecified: Secondary | ICD-10-CM | POA: Diagnosis not present

## 2022-10-26 DIAGNOSIS — F419 Anxiety disorder, unspecified: Secondary | ICD-10-CM | POA: Diagnosis not present

## 2022-10-26 IMAGING — DX DG CHEST 1V PORT
1 series · 1 of 1 positions shown · non-contrast
Comparison: None.

CLINICAL DATA: Feeling sick.  Loss of smell and fever.

EXAM:
PORTABLE CHEST 1 VIEW

[chest ap]
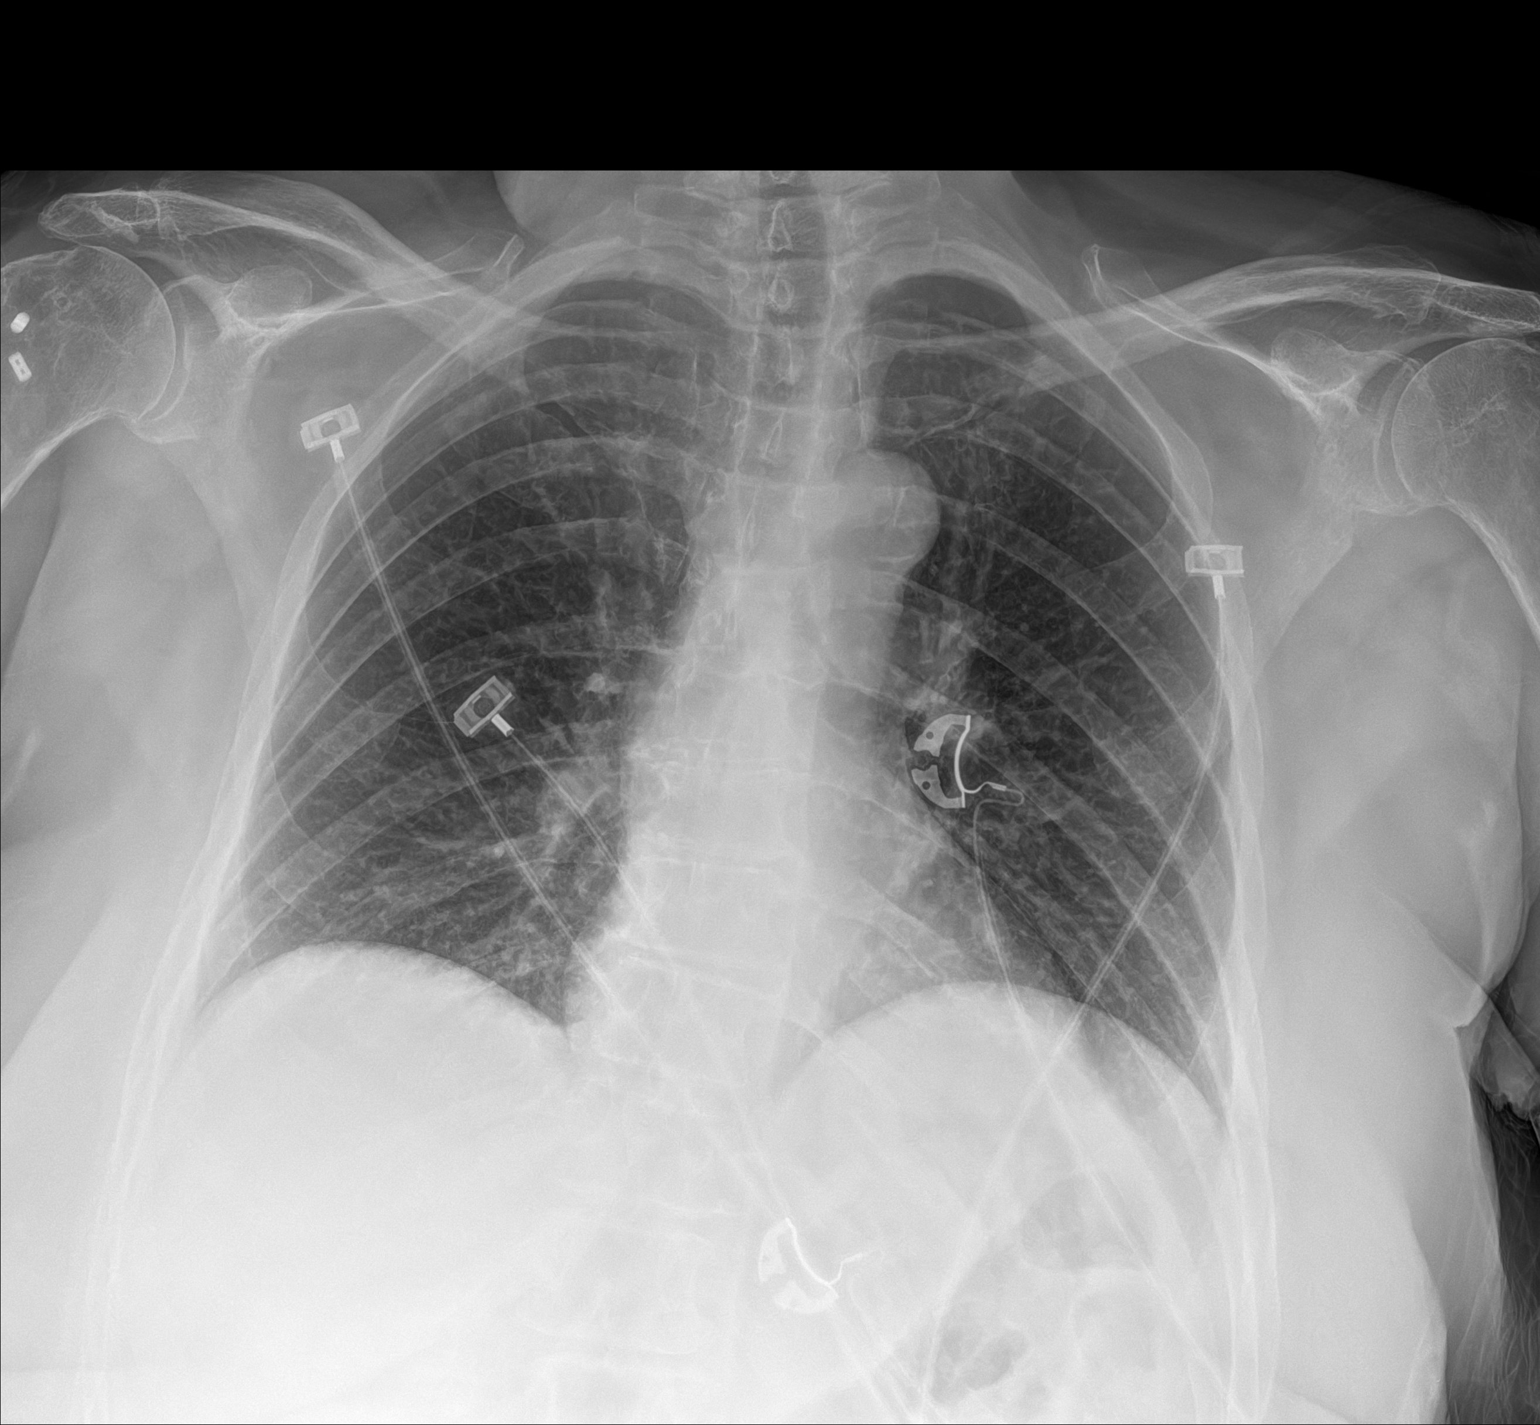

[1 of 1 positions shown; findings below may reference images not displayed]

FINDINGS: Both lungs are clear. Heart and mediastinum are within normal
limits. Trachea is midline. Negative for a pneumothorax. No acute
bone abnormality.
IMPRESSION: No active disease.

## 2022-10-29 DIAGNOSIS — K219 Gastro-esophageal reflux disease without esophagitis: Secondary | ICD-10-CM | POA: Diagnosis not present

## 2022-10-29 DIAGNOSIS — F32A Depression, unspecified: Secondary | ICD-10-CM | POA: Diagnosis not present

## 2022-10-29 DIAGNOSIS — F419 Anxiety disorder, unspecified: Secondary | ICD-10-CM | POA: Diagnosis not present

## 2022-10-29 DIAGNOSIS — Z471 Aftercare following joint replacement surgery: Secondary | ICD-10-CM | POA: Diagnosis not present

## 2022-10-29 DIAGNOSIS — Z96651 Presence of right artificial knee joint: Secondary | ICD-10-CM | POA: Diagnosis not present

## 2022-10-29 DIAGNOSIS — J45909 Unspecified asthma, uncomplicated: Secondary | ICD-10-CM | POA: Diagnosis not present

## 2022-10-30 DIAGNOSIS — M1711 Unilateral primary osteoarthritis, right knee: Secondary | ICD-10-CM | POA: Diagnosis not present

## 2022-10-31 DIAGNOSIS — M25561 Pain in right knee: Secondary | ICD-10-CM | POA: Diagnosis not present

## 2022-11-01 DIAGNOSIS — M1711 Unilateral primary osteoarthritis, right knee: Secondary | ICD-10-CM | POA: Diagnosis not present

## 2022-11-06 DIAGNOSIS — M25561 Pain in right knee: Secondary | ICD-10-CM | POA: Diagnosis not present

## 2022-11-08 DIAGNOSIS — M25561 Pain in right knee: Secondary | ICD-10-CM | POA: Diagnosis not present

## 2022-11-12 DIAGNOSIS — M25561 Pain in right knee: Secondary | ICD-10-CM | POA: Diagnosis not present

## 2022-11-14 DIAGNOSIS — M25561 Pain in right knee: Secondary | ICD-10-CM | POA: Diagnosis not present

## 2022-11-19 DIAGNOSIS — M25561 Pain in right knee: Secondary | ICD-10-CM | POA: Diagnosis not present

## 2022-11-21 DIAGNOSIS — M25561 Pain in right knee: Secondary | ICD-10-CM | POA: Diagnosis not present

## 2022-11-27 DIAGNOSIS — M1711 Unilateral primary osteoarthritis, right knee: Secondary | ICD-10-CM | POA: Diagnosis not present

## 2022-11-27 DIAGNOSIS — M25561 Pain in right knee: Secondary | ICD-10-CM | POA: Diagnosis not present

## 2022-11-28 DIAGNOSIS — M25561 Pain in right knee: Secondary | ICD-10-CM | POA: Diagnosis not present

## 2022-12-03 DIAGNOSIS — M25561 Pain in right knee: Secondary | ICD-10-CM | POA: Diagnosis not present

## 2022-12-05 DIAGNOSIS — M25561 Pain in right knee: Secondary | ICD-10-CM | POA: Diagnosis not present

## 2022-12-25 DIAGNOSIS — M818 Other osteoporosis without current pathological fracture: Secondary | ICD-10-CM | POA: Diagnosis not present

## 2022-12-25 DIAGNOSIS — M25769 Osteophyte, unspecified knee: Secondary | ICD-10-CM | POA: Diagnosis not present

## 2022-12-25 DIAGNOSIS — I10 Essential (primary) hypertension: Secondary | ICD-10-CM | POA: Diagnosis not present

## 2022-12-25 DIAGNOSIS — M150 Primary generalized (osteo)arthritis: Secondary | ICD-10-CM | POA: Diagnosis not present

## 2022-12-25 DIAGNOSIS — N182 Chronic kidney disease, stage 2 (mild): Secondary | ICD-10-CM | POA: Diagnosis not present

## 2022-12-25 DIAGNOSIS — K648 Other hemorrhoids: Secondary | ICD-10-CM | POA: Diagnosis not present

## 2022-12-25 DIAGNOSIS — F411 Generalized anxiety disorder: Secondary | ICD-10-CM | POA: Diagnosis not present

## 2023-01-08 DIAGNOSIS — M1711 Unilateral primary osteoarthritis, right knee: Secondary | ICD-10-CM | POA: Diagnosis not present

## 2023-02-12 ENCOUNTER — Telehealth (INDEPENDENT_AMBULATORY_CARE_PROVIDER_SITE_OTHER): Payer: Self-pay | Admitting: Otolaryngology

## 2023-02-12 NOTE — Telephone Encounter (Signed)
Reminder Call: Date: 02/13/2023 Status: Sch  Time: 1:10 PM [address] [place], [place] [address]  Unable to leave VM, called patient on mobile as well, patient service kept going out.

## 2023-02-13 ENCOUNTER — Encounter (INDEPENDENT_AMBULATORY_CARE_PROVIDER_SITE_OTHER): Payer: Self-pay

## 2023-02-13 ENCOUNTER — Ambulatory Visit (INDEPENDENT_AMBULATORY_CARE_PROVIDER_SITE_OTHER): Payer: Medicare Other | Admitting: Otolaryngology

## 2023-02-13 VITALS — BP 122/77 | HR 74 | Ht 64.0 in | Wt 180.0 lb

## 2023-02-13 DIAGNOSIS — H6123 Impacted cerumen, bilateral: Secondary | ICD-10-CM | POA: Insufficient documentation

## 2023-02-13 NOTE — Progress Notes (Signed)
 Patient ID: Grace Thomas, female   DOB: [birth year], 73 y.o.   MRN: [id number]  Follow up: Recurrent cerumen impaction  Procedure: Bilateral cerumen disimpaction.    Indication: Cerumen impaction, resulting in ear discomfort and conductive hearing loss.    Description: The patient is placed supine on the exam table.  Under the operating microscope, the right ear canal is examined and is noted to be completely impacted with cerumen.  The cerumen is carefully removed with a combination of suction catheters, cerumen curette, and alligator forceps.  After the cerumen removal, the ear canal and tympanic membrane are noted to be normal.  No middle ear effusion is noted.  The same procedure is then repeated on the left side without exception. The patient tolerated the procedure well.  Follow-up care:  The patient is instructed not to use Q-tips to clean the ear canals.  The patient will follow up in 6 months.

## 2023-03-15 DIAGNOSIS — M79644 Pain in right finger(s): Secondary | ICD-10-CM | POA: Diagnosis not present

## 2023-03-15 DIAGNOSIS — M67441 Ganglion, right hand: Secondary | ICD-10-CM | POA: Diagnosis not present

## 2023-03-15 DIAGNOSIS — M19041 Primary osteoarthritis, right hand: Secondary | ICD-10-CM | POA: Diagnosis not present

## 2023-03-15 DIAGNOSIS — M65351 Trigger finger, right little finger: Secondary | ICD-10-CM | POA: Diagnosis not present

## 2023-03-17 DIAGNOSIS — M65351 Trigger finger, right little finger: Secondary | ICD-10-CM | POA: Insufficient documentation

## 2023-03-17 DIAGNOSIS — M19041 Primary osteoarthritis, right hand: Secondary | ICD-10-CM | POA: Insufficient documentation

## 2023-03-26 DIAGNOSIS — N182 Chronic kidney disease, stage 2 (mild): Secondary | ICD-10-CM | POA: Diagnosis not present

## 2023-03-26 DIAGNOSIS — F411 Generalized anxiety disorder: Secondary | ICD-10-CM | POA: Diagnosis not present

## 2023-03-26 DIAGNOSIS — M150 Primary generalized (osteo)arthritis: Secondary | ICD-10-CM | POA: Diagnosis not present

## 2023-03-26 DIAGNOSIS — M818 Other osteoporosis without current pathological fracture: Secondary | ICD-10-CM | POA: Diagnosis not present

## 2023-03-26 DIAGNOSIS — I10 Essential (primary) hypertension: Secondary | ICD-10-CM | POA: Diagnosis not present

## 2023-03-27 DIAGNOSIS — M7062 Trochanteric bursitis, left hip: Secondary | ICD-10-CM | POA: Diagnosis not present

## 2023-04-24 ENCOUNTER — Ambulatory Visit: Payer: Medicare Other | Attending: Cardiology | Admitting: Cardiology

## 2023-04-24 ENCOUNTER — Encounter: Payer: Self-pay | Admitting: Cardiology

## 2023-04-24 VITALS — BP 118/74 | HR 64 | Ht 64.0 in | Wt 183.6 lb

## 2023-04-24 DIAGNOSIS — Z952 Presence of prosthetic heart valve: Secondary | ICD-10-CM | POA: Diagnosis not present

## 2023-04-24 NOTE — Progress Notes (Signed)
    Cardiology Office Note  Date: 04/24/2023   ID: HINATA DIENER, DOB 28-Sep-1950, MRN [id number]  History of Present Illness: Grace Thomas is a 73 y.o. female last seen in April 2024.  She is here for a follow-up visit.  Continues to do well with NYHA class I-II dyspnea, no exertional chest pain, no palpitations or syncope.  We went over her medications.  She reports compliance with therapy including aspirin daily.  I reviewed her interval lab work which is noted below.  Her last echocardiogram was in October 2023.  We discussed getting an updated study.  I reviewed her ECG today which shows normal sinus rhythm.  Physical Exam: VS:  BP 118/74 (Cuff Size: Normal)   Pulse 64   Ht 5\' 4"  (1.626 m)   Wt 183 lb 9.6 oz (83.3 kg)   SpO2 95%   BMI 31.51 kg/m , BMI Body mass index is 31.51 kg/m.  Wt Readings from Last 3 Encounters:  04/24/23 183 lb 9.6 oz (83.3 kg)  02/13/23 180 lb (81.6 kg)  10/15/22 182 lb (82.6 kg)    General: Patient appears comfortable at rest. HEENT: Conjunctiva and lids normal. Neck: Supple, no elevated JVP or carotid bruits. Lungs: Clear to auscultation, nonlabored breathing at rest. Cardiac: Regular rate and rhythm, no S3, 3/6 systolic murmur, no pericardial rub. Extremities: No pitting edema.  ECG:  An ECG dated 05/02/2022 was personally reviewed today and demonstrated:  Normal sinus rhythm.  Labwork: 10/05/2022: ALT 14; AST 18 10/16/2022: BUN 9; Creatinine, Ser 0.71; Hemoglobin 11.6; Platelets 207; Potassium 3.9; Sodium 137  December 2024: BUN 18, creatinine 0.83, potassium 4.6, cholesterol 225, triglycerides 246, HDL 52, LDL 129  Other Studies Reviewed Today:  No interval cardiac testing for review today.  Assessment and Plan:  1.  Severe aortic stenosis status post TAVR with 23 mm Jim Motts in October 2022.  Echocardiogram in October 2023 revealed LVEF 60 to 65% with stable TAVR prosthesis, mean AV gradient 9 mmHg and no paravalvular  regurgitation.  Continue aspirin 81 mg daily.  Obtain follow-up echocardiogram for surveillance.   2.  Normal coronary arteries documented at cardiac catheterization in August 2022.  Disposition:  Follow up  1 year.  Signed, Gerard Knight, M.D., F.A.C.C. [facility]

## 2023-04-24 NOTE — Patient Instructions (Signed)
 Medication Instructions:   Your physician recommends that you continue on your current medications as directed. Please refer to the Current Medication list given to you today.   Labwork: None today  Testing/Procedures: Your physician has requested that you have an echocardiogram. Echocardiography is a painless test that uses sound waves to create images of your heart. It provides your doctor with information about the size and shape of your heart and how well your heart's chambers and valves are working. This procedure takes approximately one hour. There are no restrictions for this procedure. Please do NOT wear cologne, perfume, aftershave, or lotions (deodorant is allowed). Please arrive 15 minutes prior to your appointment time.  Please note: We ask at that you not bring children with you during ultrasound (echo/ vascular) testing. Due to room size and safety concerns, children are not allowed in the ultrasound rooms during exams. Our front office staff cannot provide observation of children in our lobby area while testing is being conducted. An adult accompanying a patient to their appointment will only be allowed in the ultrasound room at the discretion of the ultrasound technician under special circumstances. We apologize for any inconvenience.   Follow-Up: 1 year  Any Other Special Instructions Will Be Listed Below (If Applicable).  If you need a refill on your cardiac medications before your next appointment, please call your pharmacy.

## 2023-05-08 ENCOUNTER — Telehealth: Payer: Self-pay | Admitting: Cardiology

## 2023-05-08 ENCOUNTER — Ambulatory Visit (HOSPITAL_COMMUNITY)
Admission: RE | Admit: 2023-05-08 | Discharge: 2023-05-08 | Disposition: A | Discharge status: Home / Self Care | Source: Ambulatory Visit | Attending: Cardiology | Admitting: Cardiology

## 2023-05-08 DIAGNOSIS — Z952 Presence of prosthetic heart valve: Secondary | ICD-10-CM | POA: Diagnosis not present

## 2023-05-08 LAB — ECHOCARDIOGRAM COMPLETE
AV Mean grad: 11.5 mmHg
AV Peak grad: 22.8 mmHg
Ao pk vel: 2.39 m/s
Area-P 1/2: 2.07 cm2
S' Lateral: 3 cm

## 2023-05-08 NOTE — Telephone Encounter (Signed)
 Pt returning call to a nurse for results

## 2023-05-08 NOTE — Telephone Encounter (Signed)
 The patient has been notified of the result and verbalized understanding.  All questions (if any) were answered. Carole Churches, LPN 1/61/0960 4:54 PM

## 2023-05-08 NOTE — Progress Notes (Signed)
*  PRELIMINARY RESULTS* Echocardiogram 2D Echocardiogram has been performed.  Grace Thomas 05/08/2023, 11:16 AM

## 2023-05-28 ENCOUNTER — Ambulatory Visit (INDEPENDENT_AMBULATORY_CARE_PROVIDER_SITE_OTHER): Admitting: Podiatry

## 2023-05-28 ENCOUNTER — Encounter: Payer: Self-pay | Admitting: Podiatry

## 2023-05-28 DIAGNOSIS — L603 Nail dystrophy: Secondary | ICD-10-CM

## 2023-05-28 DIAGNOSIS — M2042 Other hammer toe(s) (acquired), left foot: Secondary | ICD-10-CM | POA: Diagnosis not present

## 2023-05-28 DIAGNOSIS — M25561 Pain in right knee: Secondary | ICD-10-CM | POA: Insufficient documentation

## 2023-05-28 DIAGNOSIS — M25559 Pain in unspecified hip: Secondary | ICD-10-CM | POA: Insufficient documentation

## 2023-05-28 NOTE — Progress Notes (Signed)
 Subjective:  Patient ID: Grace Thomas, female    DOB: 17-Jun-1950,  MRN: [id number] HPI Chief Complaint  Patient presents with   Nail Problem    Hallux left - thick nail that part of has fell off, concerned about a fungus   Toe Pain    2nd toe left - hammertoe deformity x years, was told she needed surgery, but wants to know if there is anything to do other than surgery   New Patient (Initial Visit)    Est pt 2019    73 y.o. female presents with the above complaint.   ROS: Denies fever chills nausea vomiting muscle aches pains calf pain back pain chest pain shortness of breath.  Past Medical History:  Diagnosis Date   Anxiety    Aortic stenosis 11/01/2020   TAVR   Arthritis    Asthma due to seasonal allergies    Pt.denies 12/18/21   Back pain    COVID-26 July 2020   Depression    Family history of adverse reaction to anesthesia    Daughter has PONV   GERD (gastroesophageal reflux disease)    " a little"   Heart murmur    Knee pain    had gel shots for bilateral 12/05/21 was last injection   Recurrent UTI    S/P TAVR (transcatheter aortic valve replacement) 11/01/2020   Edwards 23mm S3U TF approach with Dr. Sidonie Drape   Past Surgical History:  Procedure Laterality Date   ABDOMINAL HYSTERECTOMY  1994   TAH BSO   ANTERIOR CERVICAL DECOMP/DISCECTOMY FUSION N/A 02/28/2021   Procedure: Cervical Four-Five Cervical Five-Six Anterior Cervical Decompression Fusion;  Surgeon: Pincus Bridgeman, DO;  Location: [facility] OR;  Service: Neurosurgery;  Laterality: N/A;  3C/RM 19   CARDIAC CATHETERIZATION     CARPAL TUNNEL RELEASE Bilateral    COLONOSCOPY     REVERSE SHOULDER ARTHROPLASTY Right 06/22/2021   Procedure: REVERSE SHOULDER ARTHROPLASTY;  Surgeon: Micheline Ahr, MD;  Location: [facility];  Service: Orthopedics;  Laterality: Right;   RIGHT/LEFT HEART CATH AND CORONARY ANGIOGRAPHY N/A 09/05/2020   Procedure: RIGHT/LEFT HEART CATH AND CORONARY ANGIOGRAPHY;   Surgeon: Arnoldo Lapping, MD;  Location: [facility] INVASIVE CV LAB;  Service: Cardiovascular;  Laterality: N/A;   ROTATOR CUFF REPAIR Right 2004   TEE WITHOUT CARDIOVERSION N/A 11/01/2020   Procedure: TRANSESOPHAGEAL ECHOCARDIOGRAM (TEE);  Surgeon: Arnoldo Lapping, MD;  Location: [facility] OR;  Service: Open Heart Surgery;  Laterality: N/A;   TOTAL KNEE ARTHROPLASTY Right 10/15/2022   Procedure: TOTAL KNEE ARTHROPLASTY;  Surgeon: Murleen Arms, MD;  Location: [facility] ORS;  Service: Orthopedics;  Laterality: Right;   TRANSCATHETER AORTIC VALVE REPLACEMENT, TRANSFEMORAL N/A 11/01/2020   Procedure: TRANSCATHETER AORTIC VALVE REPLACEMENT, TRANSFEMORAL;  Surgeon: Arnoldo Lapping, MD;  Location: [facility] OR;  Service: Open Heart Surgery;  Laterality: N/A;   TUBAL LIGATION      Current Outpatient Medications:    ALPRAZolam  (XANAX ) 0.25 MG tablet, Take 0.125 mg by mouth daily as needed for anxiety (anxiety/nerves)., Disp: , Rfl:    amoxicillin  (AMOXIL ) 500 MG capsule, Take 2,000 mg by mouth See admin instructions. Take 2000 mg 1 hour prior to dental work, Disp: , Rfl:    aspirin  EC 81 MG tablet, Take 81 mg by mouth daily. Swallow whole., Disp: , Rfl:    celecoxib  (CELEBREX ) 100 MG capsule, Take 100 mg by mouth 2 (two) times daily as needed., Disp: , Rfl:    Cholecalciferol  (VITAMIN D3) 50 MCG (  2000 UT) TABS, Take 2,000 Units by mouth every evening., Disp: , Rfl:    fexofenadine (ALLEGRA) 180 MG tablet, Take 180 mg by mouth daily as needed for allergies or rhinitis., Disp: , Rfl:    fluconazole (DIFLUCAN) 150 MG tablet, Take 150 mg by mouth daily as needed (yeast)., Disp: , Rfl:    hydrocortisone (ANUSOL-HC) 25 MG suppository, Place 25 mg rectally daily., Disp: , Rfl:    miconazole (MONISTAT 7) 2 % vaginal cream, Place 1 Applicatorful vaginally at bedtime as needed (yeast infection)., Disp: , Rfl:    triamcinolone  (NASACORT ) 55 MCG/ACT AERO nasal inhaler, Place 2 sprays into the nose daily as needed (allergies.)., Disp: ,  Rfl:    vitamin B-12 (CYANOCOBALAMIN) 500 MCG tablet, Take 500 mcg by mouth daily., Disp: , Rfl:   No Known Allergies Review of Systems Objective:  There were no vitals filed for this visit.  General: Well developed, nourished, in no acute distress, alert and oriented x3   Dermatological: Skin is warm, dry and supple bilateral. Nails x 10 are well maintained; remaining integument appears unremarkable at this time. There are no open sores, no preulcerative lesions, no rash or signs of infection present.  Hallux left demonstrates partial depolarization of her hallux nail plate.  She states that she had been applying a topical agent that helps debride thick nails.  The remaining portion of the nail appears to be normal the skin appears to be normal there is no signs of athlete's foot associated with the patient.  Vascular: Dorsalis Pedis artery and Posterior Tibial artery pedal pulses are 2/4 bilateral with immedate capillary fill time. Pedal hair growth present. No varicosities and no lower extremity edema present bilateral.   Neruologic: Grossly intact via light touch bilateral. Vibratory intact via tuning fork bilateral. Protective threshold with Semmes Wienstein monofilament intact to all pedal sites bilateral. Patellar and Achilles deep tendon reflexes 2+ bilateral. No Babinski or clonus noted bilateral.   Musculoskeletal: No gross boney pedal deformities bilateral. No pain, crepitus, or limitation noted with foot and ankle range of motion bilateral. Muscular strength 5/5 in all groups tested bilateral.  Rigid arthritic hammertoe deformities 2 through 5 bilateral hallux valgus deformities bilateral she does have juxtaposition of the second toes and the hallux which irritates her with some ambulation.  Gait: Unassisted, Nonantalgic.    Radiographs:  None taken  Assessment & Plan:   Assessment: Hammertoe deformities hallux valgus deformities osteoarthritis bilateral foot.  Nail dystrophy  hallux left  Plan: Did not recommend surgery for her at this point.  I did however recommend a topical antifungal cream as the new nail grows out to use this cream rather than the cream that contains the acid.     Audreanna Torrisi T. [place], [place]

## 2023-06-11 IMAGING — RF DG CERVICAL SPINE 2 OR 3 VIEWS
1 series · 2 of 2 positions shown · non-contrast
Comparison: 01/11/2021

CLINICAL DATA: Cervical fusion

EXAM:
CERVICAL SPINE - 2-3 VIEW

[Series 1: run · 2 of 2 slices shown]
[im 1/2]
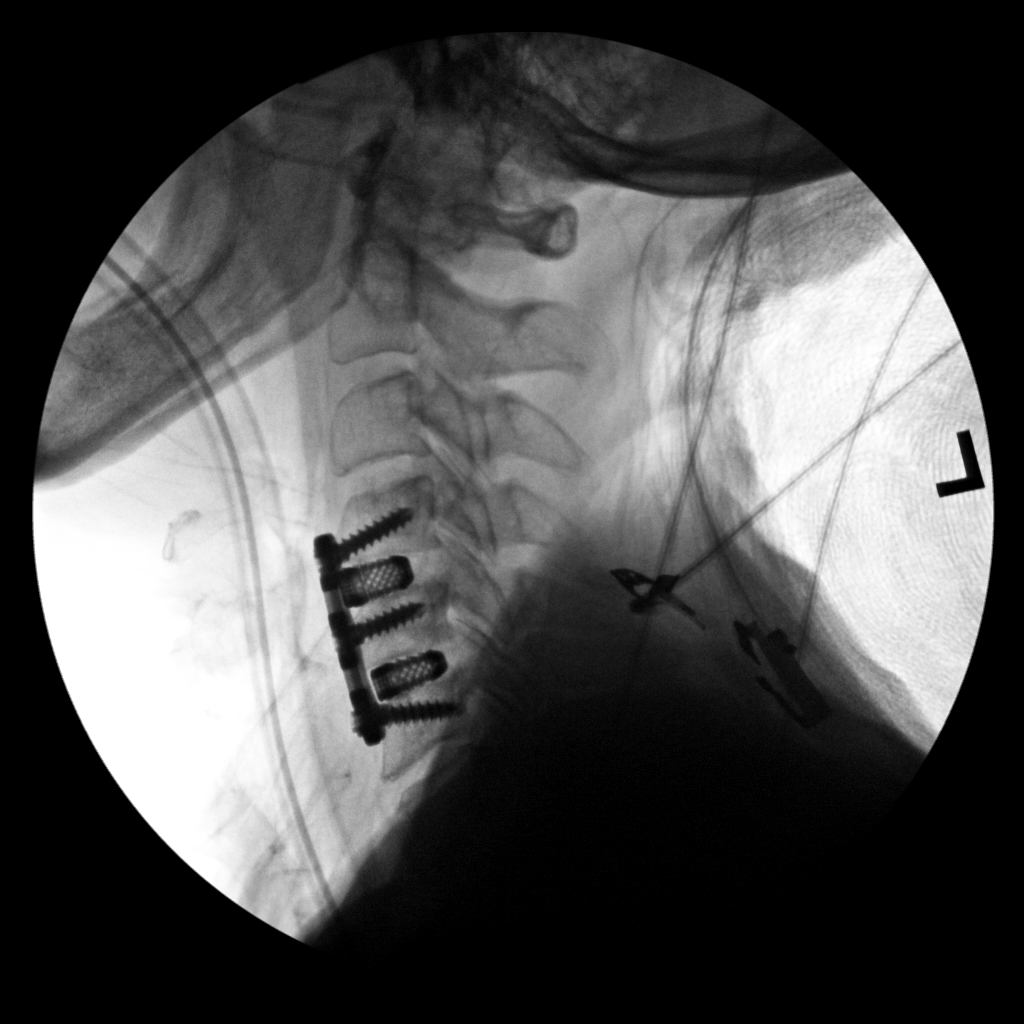
[im 2/2]
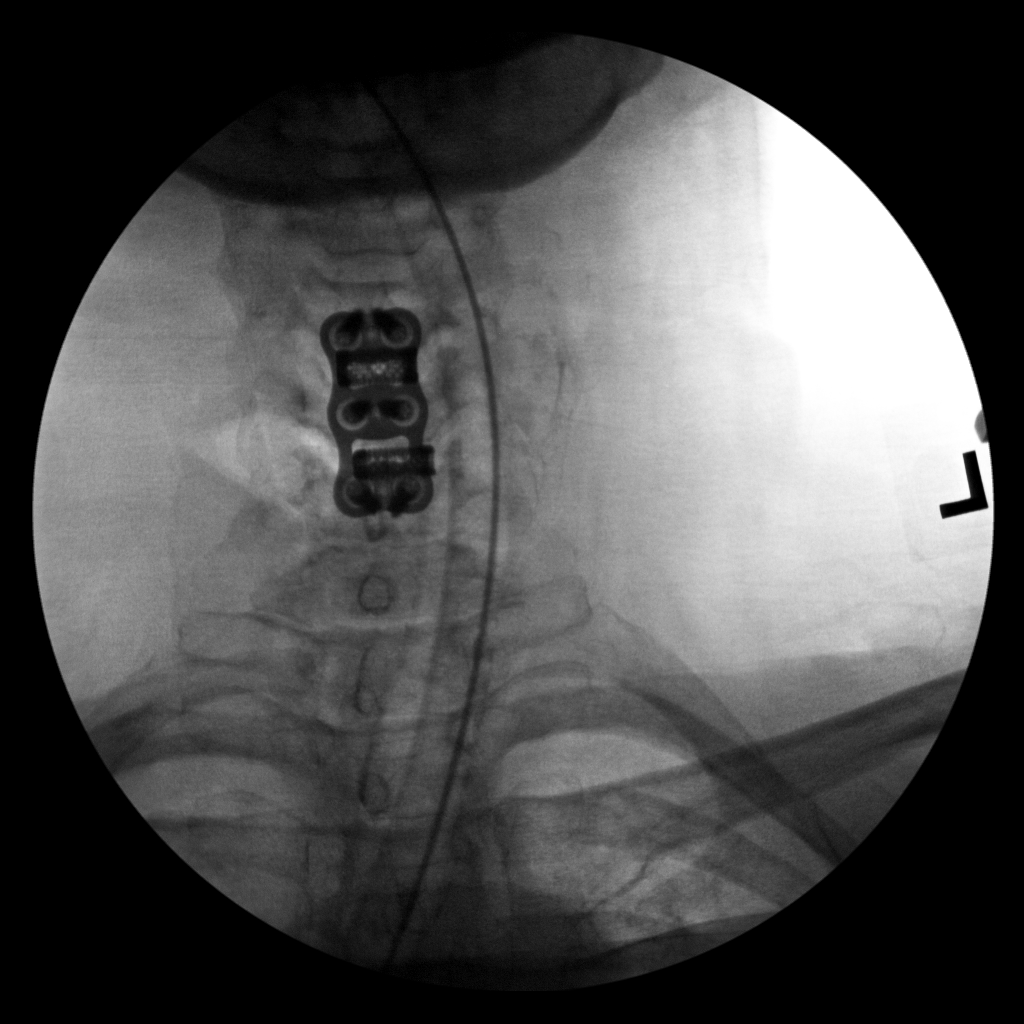

[2 of 2 positions shown; findings below may reference images not displayed]

FINDINGS: Fluoroscopic images show anterior cervical fusion at C4-C5 and C5-C6
levels. Intervertebral disc spaces are noted in place. Fluoroscopic
time was 8 seconds. Radiation dose is 1 mGy.
IMPRESSION: Fluoroscopic assistance was provided for anterior cervical fusion at
C4-C5 and C5-C6 levels.

## 2023-06-25 DIAGNOSIS — I10 Essential (primary) hypertension: Secondary | ICD-10-CM | POA: Diagnosis not present

## 2023-06-25 DIAGNOSIS — Z0000 Encounter for general adult medical examination without abnormal findings: Secondary | ICD-10-CM | POA: Diagnosis not present

## 2023-06-25 DIAGNOSIS — N182 Chronic kidney disease, stage 2 (mild): Secondary | ICD-10-CM | POA: Diagnosis not present

## 2023-06-25 DIAGNOSIS — F411 Generalized anxiety disorder: Secondary | ICD-10-CM | POA: Diagnosis not present

## 2023-06-25 DIAGNOSIS — Z1331 Encounter for screening for depression: Secondary | ICD-10-CM | POA: Diagnosis not present

## 2023-06-25 DIAGNOSIS — M150 Primary generalized (osteo)arthritis: Secondary | ICD-10-CM | POA: Diagnosis not present

## 2023-06-25 DIAGNOSIS — M5432 Sciatica, left side: Secondary | ICD-10-CM | POA: Diagnosis not present

## 2023-06-25 DIAGNOSIS — M818 Other osteoporosis without current pathological fracture: Secondary | ICD-10-CM | POA: Diagnosis not present

## 2023-06-27 ENCOUNTER — Other Ambulatory Visit (HOSPITAL_COMMUNITY): Payer: Self-pay | Admitting: Internal Medicine

## 2023-06-27 DIAGNOSIS — Z1231 Encounter for screening mammogram for malignant neoplasm of breast: Secondary | ICD-10-CM

## 2023-07-10 ENCOUNTER — Ambulatory Visit (HOSPITAL_COMMUNITY)

## 2023-07-15 ENCOUNTER — Ambulatory Visit (HOSPITAL_COMMUNITY)

## 2023-07-24 ENCOUNTER — Encounter (HOSPITAL_COMMUNITY): Payer: Self-pay

## 2023-07-24 ENCOUNTER — Ambulatory Visit (HOSPITAL_COMMUNITY)
Admission: RE | Admit: 2023-07-24 | Discharge: 2023-07-24 | Disposition: A | Discharge status: Home / Self Care | Source: Ambulatory Visit | Attending: Internal Medicine | Admitting: Internal Medicine

## 2023-07-24 DIAGNOSIS — Z1231 Encounter for screening mammogram for malignant neoplasm of breast: Secondary | ICD-10-CM | POA: Insufficient documentation

## 2023-08-08 IMAGING — CT CT SHOULDER*R* W/CM
3 series · 14 of 33 positions shown, 17 images · IV contrast (agent unspecified)
Comparison: None.

CLINICAL DATA: Rotator cuff syndrome.

EXAM:
CT OF THE UPPER RIGHT EXTREMITY WITH CONTRAST
TECHNIQUE: Multidetector CT imaging of the upper right extremity was performed
according to the standard protocol following intravenous contrast
administration.

[Series 6: soft tissue · axial · 0.46mm/px · z∈[+1194,+1314]mm · 6 of 79 slices shown, 8 images]
[im 13/79  soft-tissue]
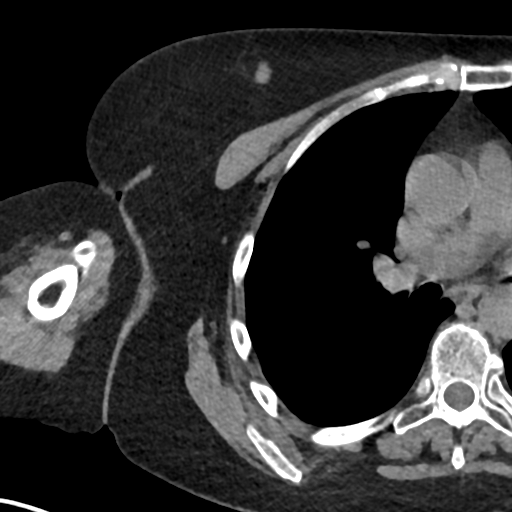
[im 13/79  bone]
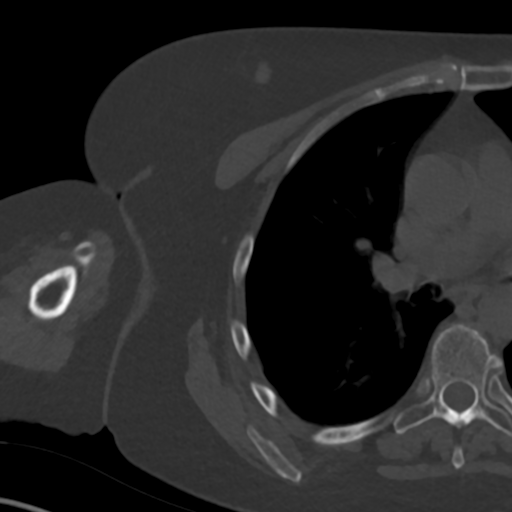
[im 25/79  bone]
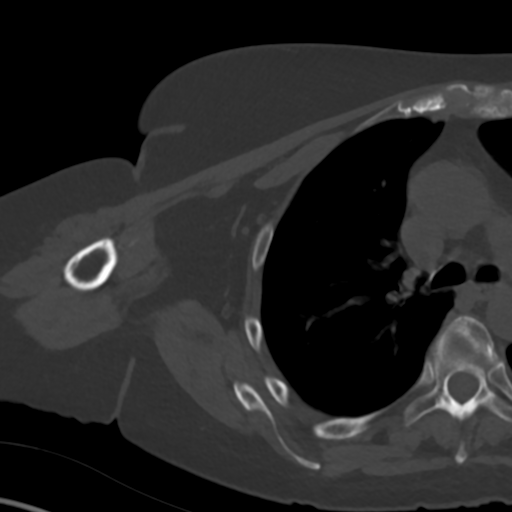
[im 37/79  bone]
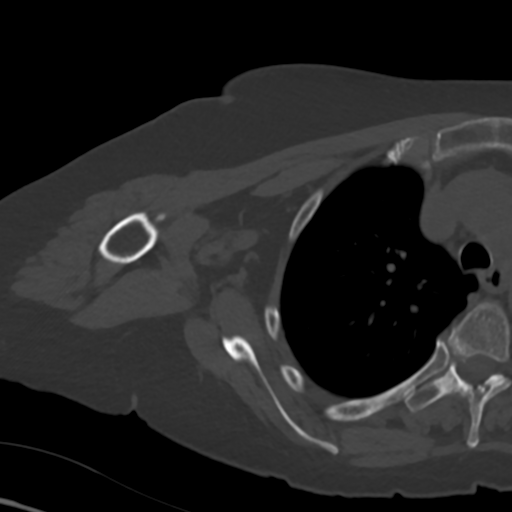
[im 49/79  bone]
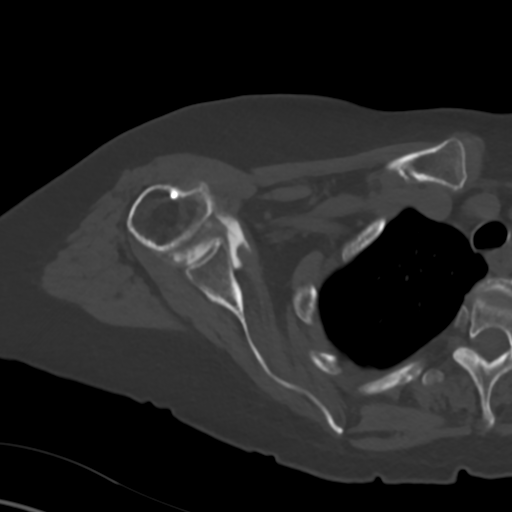
[im 61/79  soft-tissue]
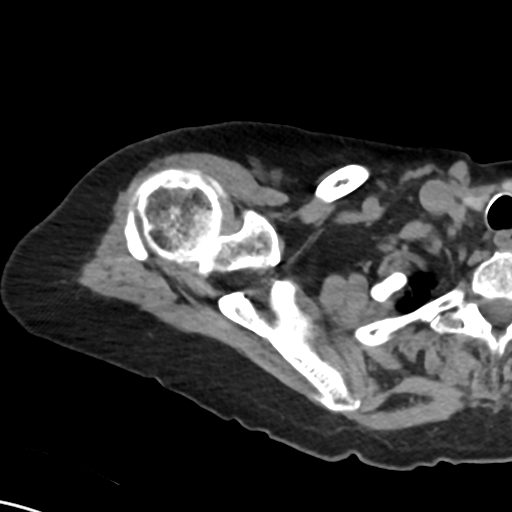
[im 61/79  bone]
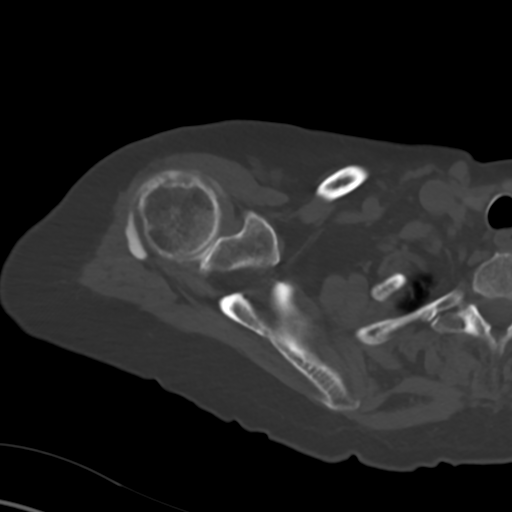
[im 73/79  bone]
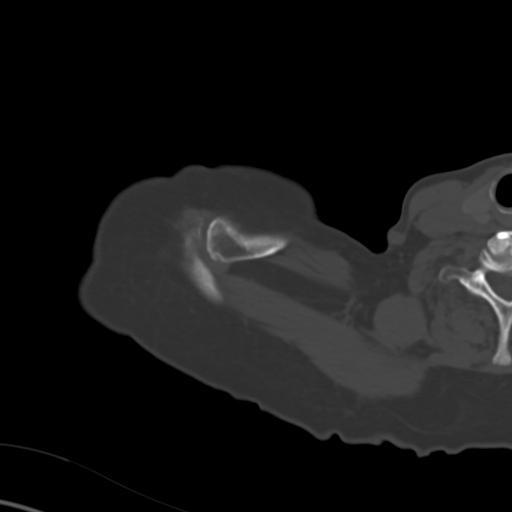

[Series 8: cor soft · coronal · 0.30mm/px · 3 of 117 slices shown]
[im 24/117  bone]
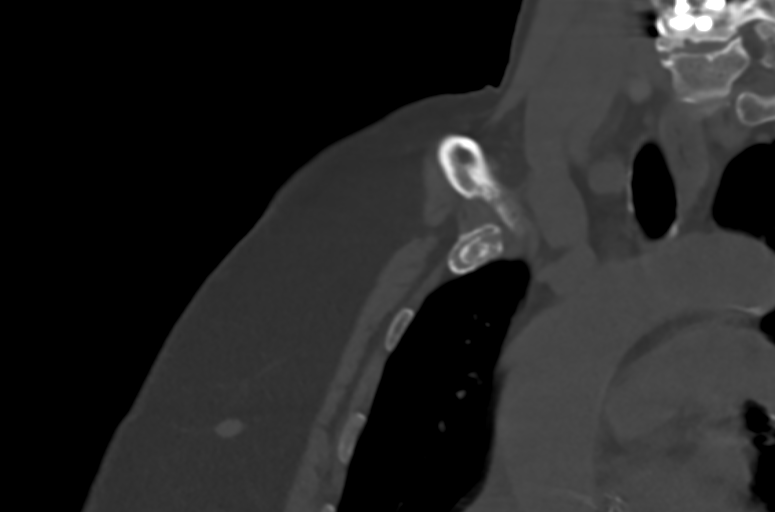
[im 47/117  bone]
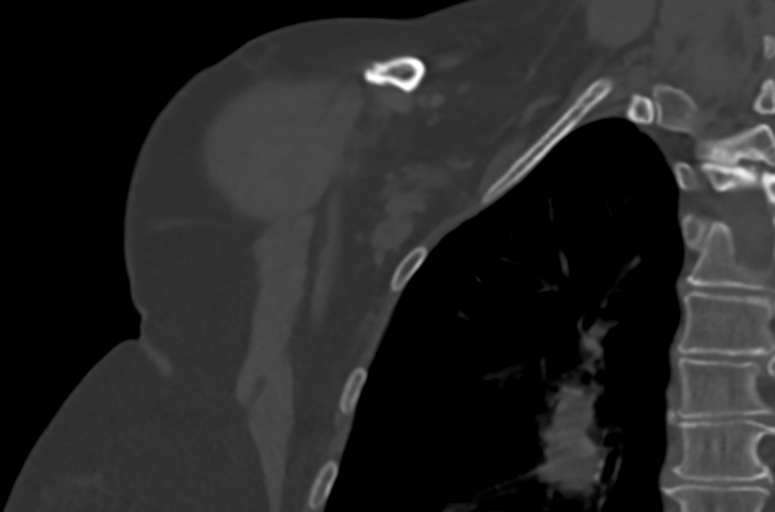
[im 70/117  bone]
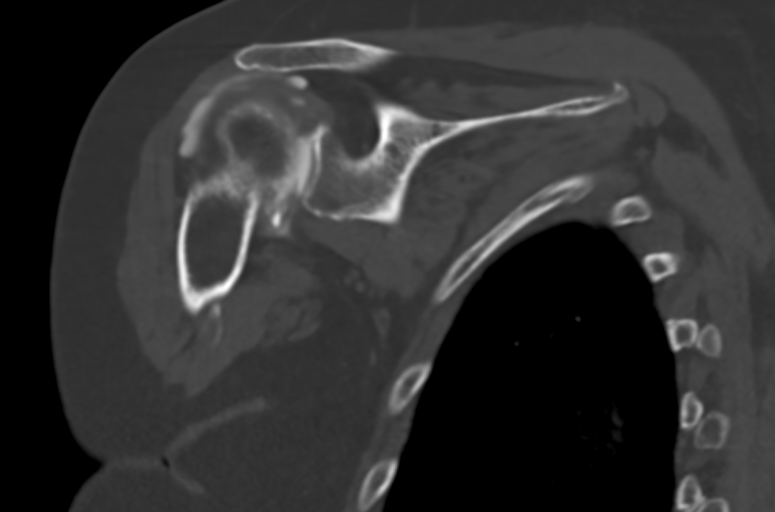

[Series 10: sag soft · sagittal · 0.30mm/px · 5 of 119 slices shown, 6 images]
[im 40/119  bone]
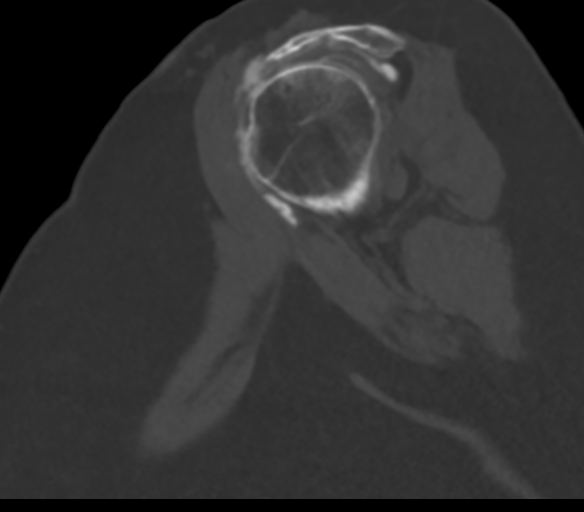
[im 50/119  bone]
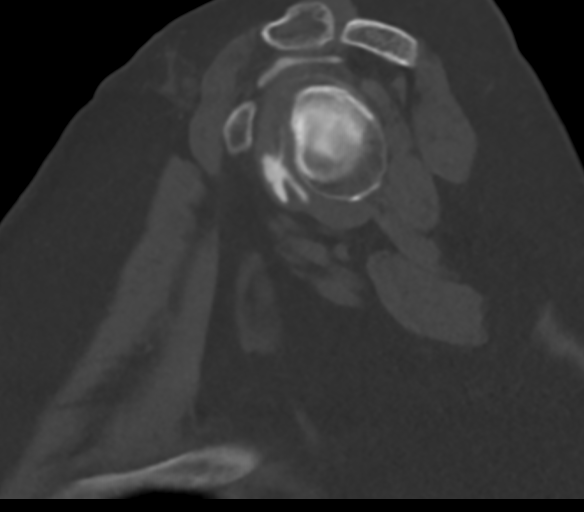
[im 60/119  soft-tissue]
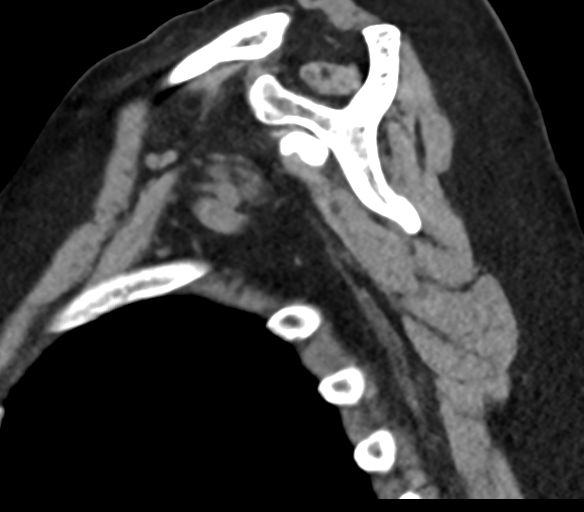
[im 60/119  bone]
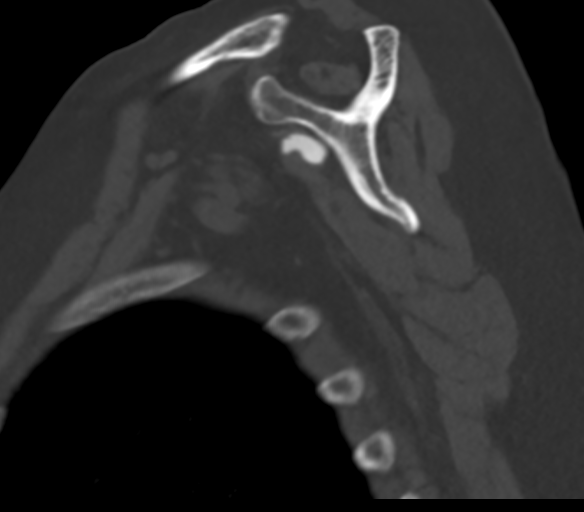
[im 69/119  bone]
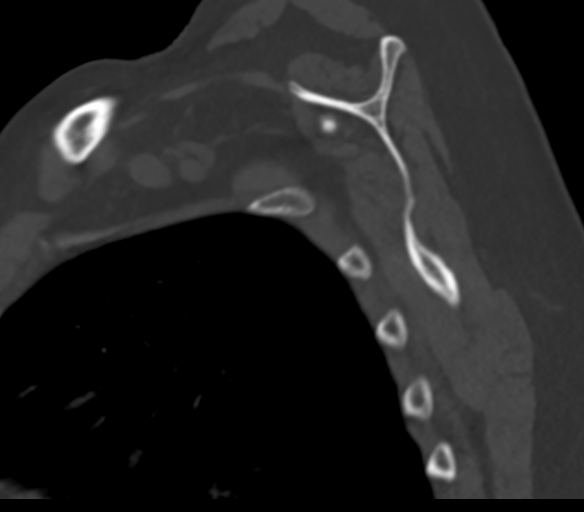
[im 79/119  bone]
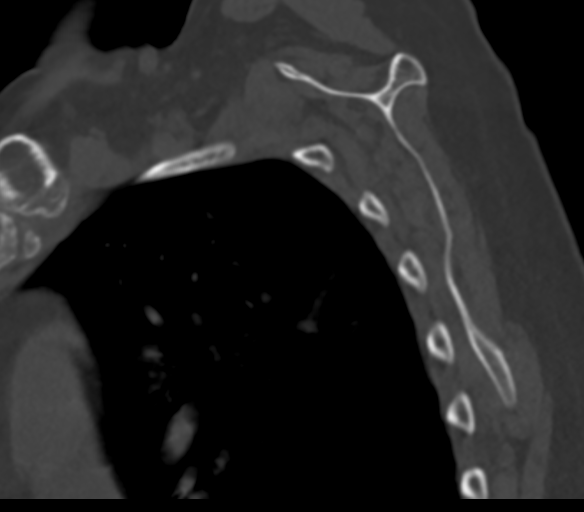

[14 of 33 positions shown; findings below may reference images not displayed]

RADIATION DOSE REDUCTION: This exam was performed according to the
departmental dose-optimization program which includes automated
exposure control, adjustment of the mA and/or kV according to
patient size and/or use of iterative reconstruction technique.

CONTRAST:  Please see procedural report for right shoulder
intra-articular injection.
FINDINGS: Muscles/tendons:

There is extravasation of intra-articular contrast into subacromial
subdeltoid bursa consistent with full-thickness rotator cuff tear,
there is a filling defect in the anterior most supraspinatus tendon.
There are surgical anchors, likely representing prior rotator cuff
repair. There is mild atrophy of the supraspinatus muscle. The
remaining rotator cuff tendons and muscles appear intact. Long head
of the biceps and deltoid are intact.

Bones/Joint/Cartilage

Mild-to-moderate acromioclavicular osteoarthritis. Moderate
glenohumeral osteoarthritis with focal full-thickness cartilage
defect of the anterior inferior humeral head. Degenerative changes
of the labrum prominent anterior inferiorly.

Ligaments

Suboptimally assessed by CT.

Soft tissues

Skin and subcutaneous soft tissues are within normal limits.
Visualized right lung is clear.
IMPRESSION: 1. Postsurgical changes from prior rotator cuff repair.
Extravasation of contrast into subacromial subdeltoid bursa
consistent with full-thickness rotator cuff tear, likely of the
supraspinatus.

2. Mild muscle atrophy of the supraspinatus, remaining rotator cuff
muscles and tendons appear maintained.

3.  Moderate glenohumeral and acromioclavicular osteoarthritis.

4.  No evidence of acute fracture or dislocation.

## 2023-08-08 IMAGING — RF DG FLUORO GUIDE NDL PLC/BX
2 series · 2 of 2 positions shown · IV contrast (omnipaque)
Comparison: none

CLINICAL DATA: Shoulder pain, prior rotator cuff repair

EXAM:
RIGHT SHOULDER INJECTION UNDER FLUOROSCOPY
TECHNIQUE: An appropriate skin entrance site was determined. The site was
marked, prepped with Betadine, draped in the usual sterile fashion,
and infiltrated locally with 1% lidocaine. 22 gauge spinal needle
was advanced to the superomedial margin of the humeral head under
intermittent fluoroscopy. 1 ml of Lidocaine injected easily. A
dilute mixture of Omnipaque was then used to opacify the Right
shoulder capsule. No immediate complication.
FLUOROSCOPY:
Radiation Exposure Index (as provided by the fluoroscopic device):
O.ImNy

[Series 1: cp_standard · 0.18mm/px · 1 of 1 slices shown (1 of 2)]
[im 1/1]
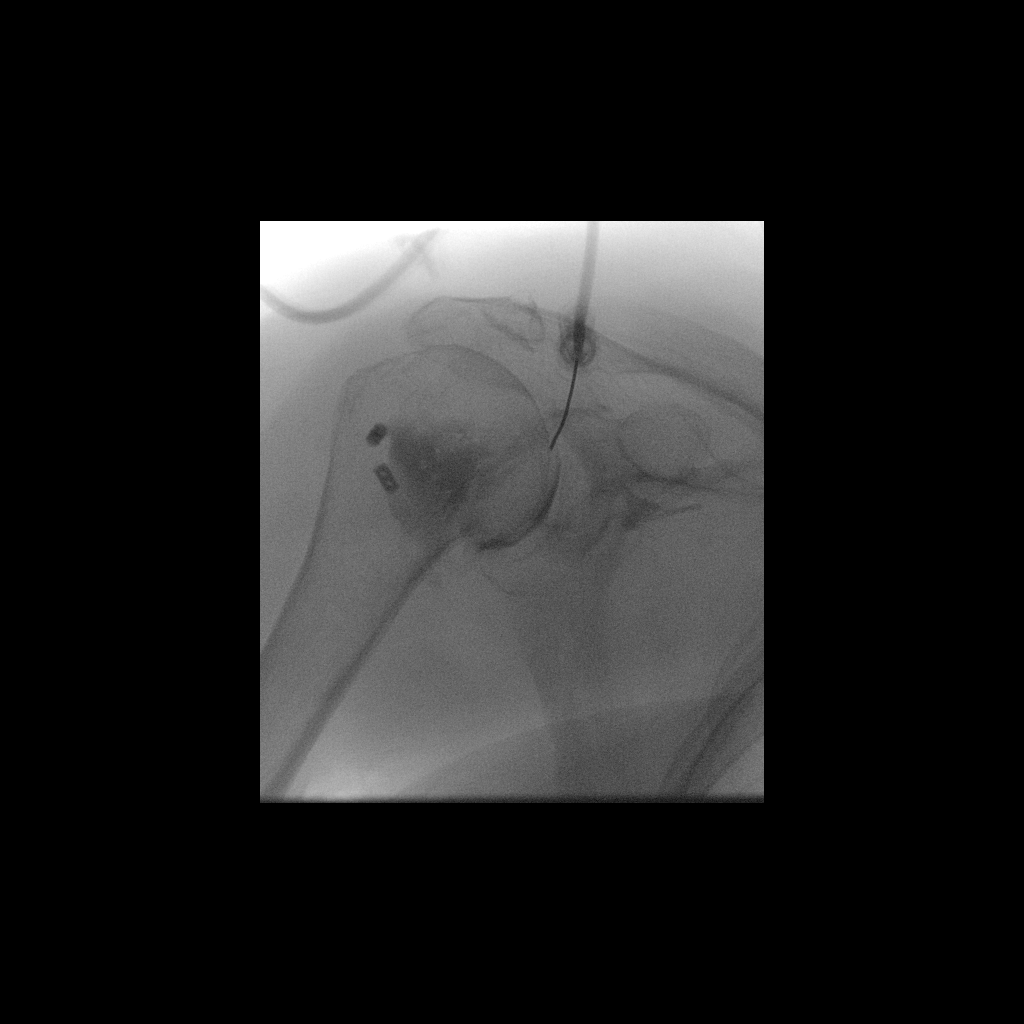

[Series 2: cp_standard · 0.18mm/px · 1 of 1 slices shown (2 of 2)]
[im 1/1]
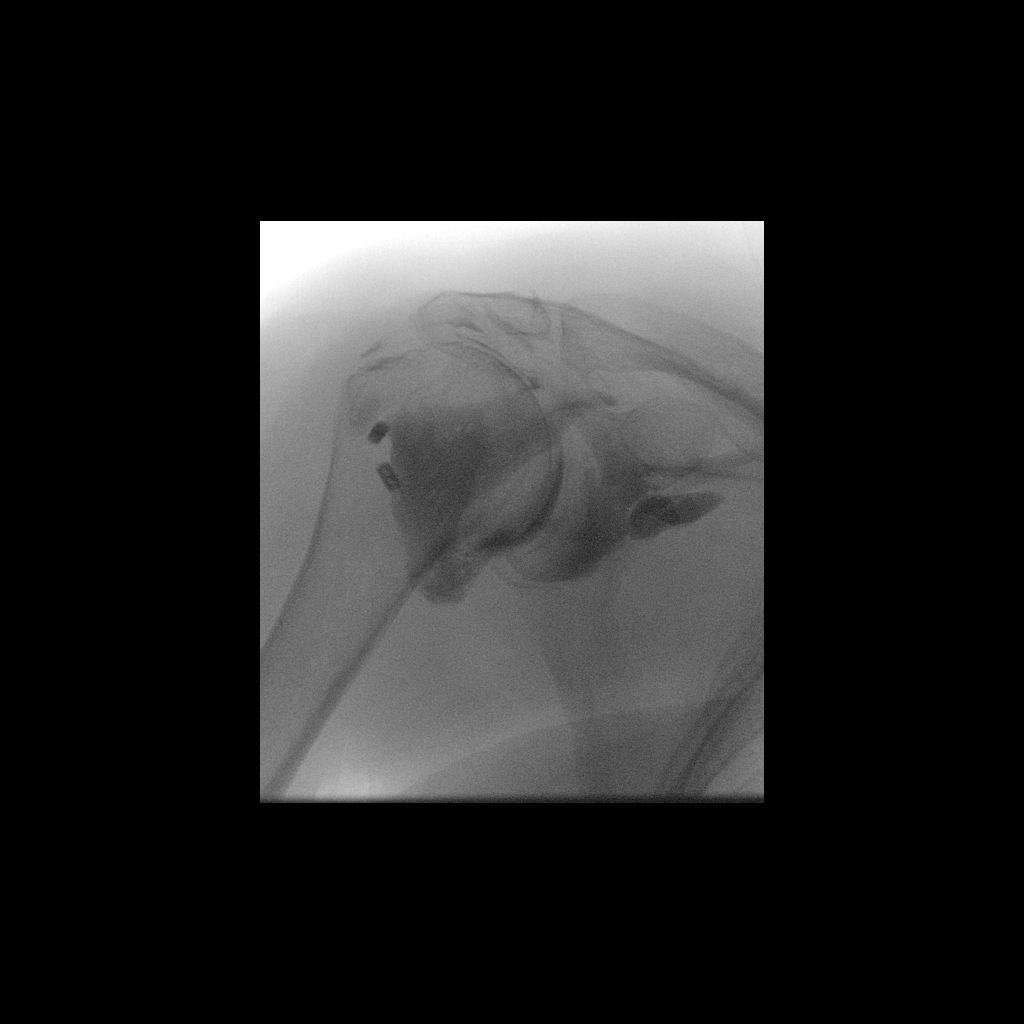

[2 of 2 positions shown; findings below may reference images not displayed]

FINDINGS: Right shoulder capsule is opacified with injected contrast. Evidence
of contrast material and the subacromial/subdeltoid bursa suggesting
a rotator cuff tear. Further evaluation with CT pending.
IMPRESSION: Technically successful right shoulder injection for CT.

Findings suggest a full-thickness rotator cuff tear. Further
evaluation with CT is pending.

Read and performed by: Eunsik Ricardo, PA-C

## 2023-08-15 ENCOUNTER — Encounter (INDEPENDENT_AMBULATORY_CARE_PROVIDER_SITE_OTHER): Payer: Self-pay | Admitting: Otolaryngology

## 2023-08-15 ENCOUNTER — Ambulatory Visit (INDEPENDENT_AMBULATORY_CARE_PROVIDER_SITE_OTHER): Payer: Medicare Other | Admitting: Otolaryngology

## 2023-08-15 VITALS — BP 131/80 | HR 81 | Wt 183.0 lb

## 2023-08-15 DIAGNOSIS — H6123 Impacted cerumen, bilateral: Secondary | ICD-10-CM | POA: Diagnosis not present

## 2023-08-15 NOTE — Progress Notes (Signed)
 Patient ID: COURTNEY BELLIZZI, female   DOB: 1950/07/18, 73 y.o.   MRN: [id number]  Procedure: Bilateral cerumen disimpaction.   Indication: Cerumen impaction, resulting in ear discomfort and conductive hearing loss.   Description: The patient is placed supine on the operating table. Under the operating microscope, the right ear canal is examined and is noted to be impacted with cerumen. The cerumen is carefully removed with a combination of suction catheters, cerumen curette, and alligator forceps. After the cerumen removal, the ear canal and tympanic membrane are noted to be normal. No middle ear effusion is noted. The same procedure is then repeated on the left side without exception. The patient tolerated the procedure well.  Follow-up care:  The patient is instructed not to use Q-tips to clean the ear canals. The patient will follow up in 5 months.

## 2023-09-10 DIAGNOSIS — M25562 Pain in left knee: Secondary | ICD-10-CM | POA: Diagnosis not present

## 2023-09-16 DIAGNOSIS — M5416 Radiculopathy, lumbar region: Secondary | ICD-10-CM | POA: Diagnosis not present

## 2023-09-18 ENCOUNTER — Other Ambulatory Visit (HOSPITAL_COMMUNITY): Payer: Self-pay | Admitting: Neurosurgery

## 2023-09-18 DIAGNOSIS — M5416 Radiculopathy, lumbar region: Secondary | ICD-10-CM

## 2023-09-23 ENCOUNTER — Ambulatory Visit (HOSPITAL_COMMUNITY)
Admission: RE | Admit: 2023-09-23 | Discharge: 2023-09-23 | Disposition: A | Discharge status: Home / Self Care | Source: Ambulatory Visit | Attending: Neurosurgery | Admitting: Neurosurgery

## 2023-09-23 DIAGNOSIS — M545 Low back pain, unspecified: Secondary | ICD-10-CM | POA: Diagnosis not present

## 2023-09-23 DIAGNOSIS — M5416 Radiculopathy, lumbar region: Secondary | ICD-10-CM | POA: Diagnosis not present

## 2023-09-24 DIAGNOSIS — N182 Chronic kidney disease, stage 2 (mild): Secondary | ICD-10-CM | POA: Diagnosis not present

## 2023-09-24 DIAGNOSIS — F411 Generalized anxiety disorder: Secondary | ICD-10-CM | POA: Diagnosis not present

## 2023-09-24 DIAGNOSIS — M5432 Sciatica, left side: Secondary | ICD-10-CM | POA: Diagnosis not present

## 2023-09-24 DIAGNOSIS — M818 Other osteoporosis without current pathological fracture: Secondary | ICD-10-CM | POA: Diagnosis not present

## 2023-09-24 DIAGNOSIS — M150 Primary generalized (osteo)arthritis: Secondary | ICD-10-CM | POA: Diagnosis not present

## 2023-09-24 DIAGNOSIS — I10 Essential (primary) hypertension: Secondary | ICD-10-CM | POA: Diagnosis not present

## 2023-10-11 DIAGNOSIS — Z6832 Body mass index (BMI) 32.0-32.9, adult: Secondary | ICD-10-CM | POA: Diagnosis not present

## 2023-10-11 DIAGNOSIS — M4726 Other spondylosis with radiculopathy, lumbar region: Secondary | ICD-10-CM | POA: Diagnosis not present

## 2023-10-11 DIAGNOSIS — M5416 Radiculopathy, lumbar region: Secondary | ICD-10-CM | POA: Diagnosis not present

## 2023-10-24 DIAGNOSIS — M47816 Spondylosis without myelopathy or radiculopathy, lumbar region: Secondary | ICD-10-CM | POA: Diagnosis not present

## 2023-10-29 DIAGNOSIS — J029 Acute pharyngitis, unspecified: Secondary | ICD-10-CM | POA: Diagnosis not present

## 2023-10-29 DIAGNOSIS — R059 Cough, unspecified: Secondary | ICD-10-CM | POA: Diagnosis not present

## 2023-10-29 DIAGNOSIS — Z20828 Contact with and (suspected) exposure to other viral communicable diseases: Secondary | ICD-10-CM | POA: Diagnosis not present

## 2023-11-11 DIAGNOSIS — M47816 Spondylosis without myelopathy or radiculopathy, lumbar region: Secondary | ICD-10-CM | POA: Diagnosis not present

## 2023-11-19 DIAGNOSIS — R051 Acute cough: Secondary | ICD-10-CM | POA: Diagnosis not present

## 2023-11-19 DIAGNOSIS — J40 Bronchitis, not specified as acute or chronic: Secondary | ICD-10-CM | POA: Diagnosis not present

## 2023-11-19 DIAGNOSIS — Z20822 Contact with and (suspected) exposure to covid-19: Secondary | ICD-10-CM | POA: Diagnosis not present

## 2023-11-19 DIAGNOSIS — J069 Acute upper respiratory infection, unspecified: Secondary | ICD-10-CM | POA: Diagnosis not present

## 2023-11-19 DIAGNOSIS — R062 Wheezing: Secondary | ICD-10-CM | POA: Diagnosis not present

## 2023-11-19 DIAGNOSIS — Z0389 Encounter for observation for other suspected diseases and conditions ruled out: Secondary | ICD-10-CM | POA: Diagnosis not present

## 2023-11-19 DIAGNOSIS — Z954 Presence of other heart-valve replacement: Secondary | ICD-10-CM | POA: Diagnosis not present

## 2023-11-28 DIAGNOSIS — M13841 Other specified arthritis, right hand: Secondary | ICD-10-CM | POA: Diagnosis not present

## 2023-11-28 DIAGNOSIS — M79644 Pain in right finger(s): Secondary | ICD-10-CM | POA: Diagnosis not present

## 2023-11-28 DIAGNOSIS — M67441 Ganglion, right hand: Secondary | ICD-10-CM | POA: Diagnosis not present

## 2023-11-28 DIAGNOSIS — M65351 Trigger finger, right little finger: Secondary | ICD-10-CM | POA: Diagnosis not present

## 2023-12-03 DIAGNOSIS — M47816 Spondylosis without myelopathy or radiculopathy, lumbar region: Secondary | ICD-10-CM | POA: Diagnosis not present

## 2023-12-24 DIAGNOSIS — M150 Primary generalized (osteo)arthritis: Secondary | ICD-10-CM | POA: Diagnosis not present

## 2023-12-24 DIAGNOSIS — I10 Essential (primary) hypertension: Secondary | ICD-10-CM | POA: Diagnosis not present

## 2023-12-24 DIAGNOSIS — M818 Other osteoporosis without current pathological fracture: Secondary | ICD-10-CM | POA: Diagnosis not present

## 2023-12-24 DIAGNOSIS — N182 Chronic kidney disease, stage 2 (mild): Secondary | ICD-10-CM | POA: Diagnosis not present

## 2023-12-24 DIAGNOSIS — F411 Generalized anxiety disorder: Secondary | ICD-10-CM | POA: Diagnosis not present

## 2023-12-24 DIAGNOSIS — M5432 Sciatica, left side: Secondary | ICD-10-CM | POA: Diagnosis not present

## 2024-01-21 ENCOUNTER — Encounter (INDEPENDENT_AMBULATORY_CARE_PROVIDER_SITE_OTHER): Payer: Self-pay | Admitting: Otolaryngology

## 2024-01-21 ENCOUNTER — Ambulatory Visit (INDEPENDENT_AMBULATORY_CARE_PROVIDER_SITE_OTHER): Admitting: Otolaryngology

## 2024-01-21 VITALS — HR 78 | Temp 98.3°F | Ht 63.0 in | Wt 183.0 lb

## 2024-01-21 DIAGNOSIS — H6123 Impacted cerumen, bilateral: Secondary | ICD-10-CM | POA: Diagnosis not present

## 2024-01-21 NOTE — Progress Notes (Signed)
 Patient ID: Grace Thomas, female   DOB: 1950/07/18, 74 y.o.   MRN: [id number]  Procedure: Bilateral cerumen disimpaction.   Indication: Cerumen impaction, resulting in ear discomfort and conductive hearing loss.   Description: The patient is placed supine on the operating table. Under the operating microscope, the right ear canal is examined and is noted to be impacted with cerumen. The cerumen is carefully removed with a combination of suction catheters, cerumen curette, and alligator forceps. After the cerumen removal, the ear canal and tympanic membrane are noted to be normal. No middle ear effusion is noted. The same procedure is then repeated on the left side without exception. The patient tolerated the procedure well.  Follow-up care:  The patient is instructed not to use Q-tips to clean the ear canals. The patient will follow up in 5 months.

## 2024-06-16 ENCOUNTER — Ambulatory Visit (INDEPENDENT_AMBULATORY_CARE_PROVIDER_SITE_OTHER): Admitting: Otolaryngology
